# Patient Record
Sex: Male | Born: 1940 | Race: White | Hispanic: No | Marital: Married | Smoking: Never smoker
Health system: Southern US, Community
[De-identification: ages and names within clinical notes are randomized; demographics above are authoritative.]

## PROBLEM LIST (undated history)

## (undated) DIAGNOSIS — E119 Type 2 diabetes mellitus without complications: Secondary | ICD-10-CM

## (undated) DIAGNOSIS — K449 Diaphragmatic hernia without obstruction or gangrene: Secondary | ICD-10-CM

## (undated) DIAGNOSIS — C4491 Basal cell carcinoma of skin, unspecified: Secondary | ICD-10-CM

## (undated) DIAGNOSIS — I639 Cerebral infarction, unspecified: Secondary | ICD-10-CM

## (undated) DIAGNOSIS — N486 Induration penis plastica: Secondary | ICD-10-CM

## (undated) DIAGNOSIS — Z8719 Personal history of other diseases of the digestive system: Secondary | ICD-10-CM

## (undated) DIAGNOSIS — G20A1 Parkinson's disease without dyskinesia, without mention of fluctuations: Secondary | ICD-10-CM

## (undated) DIAGNOSIS — G20 Parkinson's disease: Secondary | ICD-10-CM

## (undated) DIAGNOSIS — E7800 Pure hypercholesterolemia, unspecified: Secondary | ICD-10-CM

## (undated) DIAGNOSIS — I10 Essential (primary) hypertension: Secondary | ICD-10-CM

## (undated) DIAGNOSIS — C439 Malignant melanoma of skin, unspecified: Secondary | ICD-10-CM

## (undated) DIAGNOSIS — Z8711 Personal history of peptic ulcer disease: Secondary | ICD-10-CM

## (undated) DIAGNOSIS — K219 Gastro-esophageal reflux disease without esophagitis: Secondary | ICD-10-CM

## (undated) HISTORY — DX: Personal history of other diseases of the digestive system: Z87.19

## (undated) HISTORY — DX: Basal cell carcinoma of skin, unspecified: C44.91

## (undated) HISTORY — DX: Malignant melanoma of skin, unspecified: C43.9

## (undated) HISTORY — DX: Induration penis plastica: N48.6

## (undated) HISTORY — DX: Diaphragmatic hernia without obstruction or gangrene: K44.9

## (undated) HISTORY — DX: Cerebral infarction, unspecified: I63.9

## (undated) HISTORY — DX: Pure hypercholesterolemia, unspecified: E78.00

## (undated) HISTORY — DX: Gastro-esophageal reflux disease without esophagitis: K21.9

## (undated) HISTORY — DX: Personal history of peptic ulcer disease: Z87.11

## (undated) HISTORY — DX: Parkinson's disease without dyskinesia, without mention of fluctuations: G20.A1

## (undated) HISTORY — DX: Parkinson's disease: G20

## (undated) HISTORY — DX: Type 2 diabetes mellitus without complications: E11.9

## (undated) HISTORY — DX: Essential (primary) hypertension: I10

---

## 1993-02-22 HISTORY — PX: CHOLECYSTECTOMY: SHX55

## 2000-12-25 HISTORY — PX: OTHER SURGICAL HISTORY: SHX169

## 2001-05-28 ENCOUNTER — Ambulatory Visit (HOSPITAL_COMMUNITY): Admission: RE | Admit: 2001-05-28 | Discharge: 2001-05-28 | Payer: Self-pay

## 2005-11-06 ENCOUNTER — Ambulatory Visit: Payer: Self-pay | Admitting: Pulmonary Disease

## 2005-11-14 ENCOUNTER — Ambulatory Visit: Payer: Self-pay | Admitting: Pulmonary Disease

## 2006-12-03 ENCOUNTER — Ambulatory Visit: Payer: Self-pay | Admitting: Pulmonary Disease

## 2007-09-09 ENCOUNTER — Ambulatory Visit: Payer: Self-pay | Admitting: Pulmonary Disease

## 2007-09-09 LAB — CONVERTED CEMR LAB
ALT: 37 units/L (ref 0–53)
AST: 32 units/L (ref 0–37)
Albumin: 4 g/dL (ref 3.5–5.2)
Alkaline Phosphatase: 51 units/L (ref 39–117)
BUN: 7 mg/dL (ref 6–23)
Basophils Absolute: 0.1 10*3/uL (ref 0.0–0.1)
Basophils Relative: 1.1 % — ABNORMAL HIGH (ref 0.0–1.0)
Bilirubin, Direct: 0.2 mg/dL (ref 0.0–0.3)
CO2: 31 meq/L (ref 19–32)
Calcium: 8.8 mg/dL (ref 8.4–10.5)
Chloride: 108 meq/L (ref 96–112)
Cholesterol: 133 mg/dL (ref 0–200)
Creatinine, Ser: 1.1 mg/dL (ref 0.4–1.5)
Eosinophils Absolute: 0.1 10*3/uL (ref 0.0–0.6)
Eosinophils Relative: 1.6 % (ref 0.0–5.0)
GFR calc Af Amer: 86 mL/min
GFR calc non Af Amer: 71 mL/min
Glucose, Bld: 113 mg/dL — ABNORMAL HIGH (ref 70–99)
HCT: 45.9 % (ref 39.0–52.0)
HDL: 33.1 mg/dL — ABNORMAL LOW (ref >39.0)
Hemoglobin: 15.9 g/dL (ref 13.0–17.0)
LDL Cholesterol: 81 mg/dL (ref 0–99)
Lymphocytes Relative: 34.3 % (ref 12.0–46.0)
MCHC: 34.6 g/dL (ref 30.0–36.0)
MCV: 94.1 fL (ref 78.0–100.0)
Monocytes Absolute: 0.7 10*3/uL (ref 0.2–0.7)
Monocytes Relative: 10 % (ref 3.0–11.0)
Neutro Abs: 3.9 10*3/uL (ref 1.4–7.7)
Neutrophils Relative %: 53 % (ref 43.0–77.0)
PSA: 0.64 ng/mL (ref 0.10–4.00)
Platelets: 203 10*3/uL (ref 150–400)
Potassium: 3.9 meq/L (ref 3.5–5.1)
RBC: 4.87 M/uL (ref 4.22–5.81)
RDW: 12.8 % (ref 11.5–14.6)
Sodium: 144 meq/L (ref 135–145)
TSH: 1.51 microintl units/mL (ref 0.35–5.50)
Total Bilirubin: 1.2 mg/dL (ref 0.3–1.2)
Total CHOL/HDL Ratio: 4
Total Protein: 6.9 g/dL (ref 6.0–8.3)
Triglycerides: 95 mg/dL (ref 0–149)
VLDL: 19 mg/dL (ref 0–40)
WBC: 7.3 10*3/uL (ref 4.5–10.5)

## 2007-10-21 ENCOUNTER — Ambulatory Visit: Payer: Self-pay | Admitting: Gastroenterology

## 2007-10-21 ENCOUNTER — Ambulatory Visit: Payer: Self-pay | Admitting: Pulmonary Disease

## 2007-10-21 LAB — CONVERTED CEMR LAB
Bilirubin Urine: NEGATIVE
Urine Glucose: NEGATIVE mg/dL

## 2008-03-05 ENCOUNTER — Telehealth (INDEPENDENT_AMBULATORY_CARE_PROVIDER_SITE_OTHER): Payer: Self-pay | Admitting: *Deleted

## 2008-08-17 ENCOUNTER — Telehealth (INDEPENDENT_AMBULATORY_CARE_PROVIDER_SITE_OTHER): Payer: Self-pay | Admitting: *Deleted

## 2008-08-18 ENCOUNTER — Encounter: Payer: Self-pay | Admitting: Pulmonary Disease

## 2008-11-17 ENCOUNTER — Telehealth (INDEPENDENT_AMBULATORY_CARE_PROVIDER_SITE_OTHER): Payer: Self-pay | Admitting: *Deleted

## 2008-11-23 ENCOUNTER — Encounter: Payer: Self-pay | Admitting: Pulmonary Disease

## 2008-12-16 DIAGNOSIS — E7800 Pure hypercholesterolemia, unspecified: Secondary | ICD-10-CM | POA: Insufficient documentation

## 2008-12-16 DIAGNOSIS — M545 Low back pain, unspecified: Secondary | ICD-10-CM | POA: Insufficient documentation

## 2008-12-16 DIAGNOSIS — K219 Gastro-esophageal reflux disease without esophagitis: Secondary | ICD-10-CM | POA: Insufficient documentation

## 2008-12-16 DIAGNOSIS — J309 Allergic rhinitis, unspecified: Secondary | ICD-10-CM | POA: Insufficient documentation

## 2008-12-17 ENCOUNTER — Ambulatory Visit: Payer: Self-pay | Admitting: Pulmonary Disease

## 2008-12-17 DIAGNOSIS — N4889 Other specified disorders of penis: Secondary | ICD-10-CM | POA: Insufficient documentation

## 2008-12-17 DIAGNOSIS — K449 Diaphragmatic hernia without obstruction or gangrene: Secondary | ICD-10-CM | POA: Insufficient documentation

## 2008-12-20 LAB — CONVERTED CEMR LAB
AST: 45 units/L — ABNORMAL HIGH (ref 0–37)
Alkaline Phosphatase: 52 units/L (ref 39–117)
BUN: 13 mg/dL (ref 6–23)
Bacteria, UA: NEGATIVE
Basophils Absolute: 0 10*3/uL (ref 0.0–0.1)
Basophils Relative: 0.4 % (ref 0.0–3.0)
Bilirubin, Direct: 0.2 mg/dL (ref 0.0–0.3)
Chloride: 103 meq/L (ref 96–112)
Eosinophils Relative: 1.5 % (ref 0.0–5.0)
GFR calc non Af Amer: 71 mL/min
Ketones, ur: NEGATIVE mg/dL
LDL Cholesterol: 102 mg/dL — ABNORMAL HIGH (ref 0–99)
Leukocytes, UA: NEGATIVE
Lymphocytes Relative: 28.5 % (ref 12.0–46.0)
Neutrophils Relative %: 60.9 % (ref 43.0–77.0)
Potassium: 4.2 meq/L (ref 3.5–5.1)
RBC / HPF: NONE SEEN
RBC: 5.28 M/uL (ref 4.22–5.81)
Sodium: 141 meq/L (ref 135–145)
Specific Gravity, Urine: >=1.03 (ref 1.000–1.03)
Total Bilirubin: 1.1 mg/dL (ref 0.3–1.2)
Total CHOL/HDL Ratio: 4
VLDL: 31 mg/dL (ref 0–40)
WBC: 8.8 10*3/uL (ref 4.5–10.5)
pH: 5.5 (ref 5.0–8.0)

## 2008-12-21 ENCOUNTER — Telehealth: Payer: Self-pay | Admitting: Pulmonary Disease

## 2008-12-29 ENCOUNTER — Telehealth: Payer: Self-pay | Admitting: Pulmonary Disease

## 2008-12-30 ENCOUNTER — Telehealth (INDEPENDENT_AMBULATORY_CARE_PROVIDER_SITE_OTHER): Payer: Self-pay | Admitting: *Deleted

## 2009-01-04 ENCOUNTER — Telehealth (INDEPENDENT_AMBULATORY_CARE_PROVIDER_SITE_OTHER): Payer: Self-pay | Admitting: *Deleted

## 2009-01-05 ENCOUNTER — Encounter: Payer: Self-pay | Admitting: Pulmonary Disease

## 2009-03-24 ENCOUNTER — Telehealth (INDEPENDENT_AMBULATORY_CARE_PROVIDER_SITE_OTHER): Payer: Self-pay | Admitting: *Deleted

## 2009-03-25 ENCOUNTER — Telehealth (INDEPENDENT_AMBULATORY_CARE_PROVIDER_SITE_OTHER): Payer: Self-pay | Admitting: *Deleted

## 2009-03-25 ENCOUNTER — Ambulatory Visit: Payer: Self-pay | Admitting: Pulmonary Disease

## 2009-03-25 DIAGNOSIS — I10 Essential (primary) hypertension: Secondary | ICD-10-CM | POA: Insufficient documentation

## 2009-04-26 ENCOUNTER — Ambulatory Visit: Payer: Self-pay | Admitting: Pulmonary Disease

## 2009-04-26 DIAGNOSIS — R7309 Other abnormal glucose: Secondary | ICD-10-CM | POA: Insufficient documentation

## 2009-11-22 ENCOUNTER — Telehealth: Payer: Self-pay | Admitting: Pulmonary Disease

## 2009-11-24 HISTORY — PX: CATARACT EXTRACTION: SUR2

## 2009-12-30 ENCOUNTER — Ambulatory Visit: Payer: Self-pay | Admitting: Pulmonary Disease

## 2010-01-02 LAB — CONVERTED CEMR LAB
ALT: 30 units/L (ref 0–53)
Alkaline Phosphatase: 53 units/L (ref 39–117)
BUN: 10 mg/dL (ref 6–23)
Basophils Relative: 0.8 % (ref 0.0–3.0)
Bilirubin, Direct: 0.1 mg/dL (ref 0.0–0.3)
Calcium: 9.3 mg/dL (ref 8.4–10.5)
Creatinine, Ser: 1.1 mg/dL (ref 0.4–1.5)
Eosinophils Absolute: 0.1 10*3/uL (ref 0.0–0.7)
Eosinophils Relative: 1.1 % (ref 0.0–5.0)
HDL: 42.6 mg/dL (ref >39.00)
Hgb A1c MFr Bld: 6.1 % (ref 4.6–6.5)
LDL Cholesterol: 82 mg/dL (ref 0–99)
Lymphocytes Relative: 29.4 % (ref 12.0–46.0)
Neutrophils Relative %: 58.5 % (ref 43.0–77.0)
PSA: 1.25 ng/mL (ref 0.10–4.00)
Platelets: 179 10*3/uL (ref 150.0–400.0)
RBC: 4.89 M/uL (ref 4.22–5.81)
Total Bilirubin: 0.9 mg/dL (ref 0.3–1.2)
Total CHOL/HDL Ratio: 4
Total Protein: 7.2 g/dL (ref 6.0–8.3)
Triglycerides: 137 mg/dL (ref 0.0–149.0)
VLDL: 27.4 mg/dL (ref 0.0–40.0)
WBC: 7.2 10*3/uL (ref 4.5–10.5)

## 2010-01-21 ENCOUNTER — Ambulatory Visit: Payer: Self-pay | Admitting: Pulmonary Disease

## 2010-01-26 LAB — CONVERTED CEMR LAB
OCCULT 2: NEGATIVE
OCCULT 3: NEGATIVE
OCCULT 4: NEGATIVE

## 2010-05-31 ENCOUNTER — Telehealth: Payer: Self-pay | Admitting: Pulmonary Disease

## 2010-06-08 ENCOUNTER — Telehealth: Payer: Self-pay | Admitting: Pulmonary Disease

## 2010-12-30 ENCOUNTER — Other Ambulatory Visit: Payer: Self-pay | Admitting: Pulmonary Disease

## 2010-12-30 ENCOUNTER — Ambulatory Visit
Admission: RE | Admit: 2010-12-30 | Discharge: 2010-12-30 | Payer: Self-pay | Source: Home / Self Care | Attending: Internal Medicine | Admitting: Internal Medicine

## 2010-12-30 ENCOUNTER — Ambulatory Visit
Admission: RE | Admit: 2010-12-30 | Discharge: 2010-12-30 | Payer: Self-pay | Source: Home / Self Care | Attending: Pulmonary Disease | Admitting: Pulmonary Disease

## 2010-12-30 LAB — HEPATIC FUNCTION PANEL
ALT: 37 U/L (ref 0–53)
AST: 32 U/L (ref 0–37)
Albumin: 4.1 g/dL (ref 3.5–5.2)
Alkaline Phosphatase: 53 U/L (ref 39–117)
Bilirubin, Direct: 0.1 mg/dL (ref 0.0–0.3)
Total Bilirubin: 0.8 mg/dL (ref 0.3–1.2)
Total Protein: 7.1 g/dL (ref 6.0–8.3)

## 2010-12-30 LAB — LIPID PANEL
Cholesterol: 147 mg/dL (ref 0–200)
HDL: 36.5 mg/dL — ABNORMAL LOW (ref >39.00)
LDL Cholesterol: 85 mg/dL (ref 0–99)
Total CHOL/HDL Ratio: 4
Triglycerides: 130 mg/dL (ref 0.0–149.0)
VLDL: 26 mg/dL (ref 0.0–40.0)

## 2010-12-30 LAB — BASIC METABOLIC PANEL
BUN: 16 mg/dL (ref 6–23)
CO2: 27 mEq/L (ref 19–32)
Calcium: 9.2 mg/dL (ref 8.4–10.5)
Chloride: 106 mEq/L (ref 96–112)
Creatinine, Ser: 1.1 mg/dL (ref 0.4–1.5)
GFR: 71.69 mL/min (ref >60.00)
Glucose, Bld: 111 mg/dL — ABNORMAL HIGH (ref 70–99)
Potassium: 4.7 mEq/L (ref 3.5–5.1)
Sodium: 142 mEq/L (ref 135–145)

## 2010-12-30 LAB — TSH: TSH: 1.69 u[IU]/mL (ref 0.35–5.50)

## 2010-12-30 LAB — CBC WITH DIFFERENTIAL/PLATELET
Basophils Absolute: 0 10*3/uL (ref 0.0–0.1)
Basophils Relative: 0.4 % (ref 0.0–3.0)
Eosinophils Absolute: 0.1 10*3/uL (ref 0.0–0.7)
Eosinophils Relative: 1.8 % (ref 0.0–5.0)
HCT: 46.3 % (ref 39.0–52.0)
Hemoglobin: 16.1 g/dL (ref 13.0–17.0)
Lymphocytes Relative: 31.6 % (ref 12.0–46.0)
Lymphs Abs: 2.5 10*3/uL (ref 0.7–4.0)
MCHC: 34.8 g/dL (ref 30.0–36.0)
MCV: 94.4 fl (ref 78.0–100.0)
Monocytes Absolute: 0.8 10*3/uL (ref 0.1–1.0)
Monocytes Relative: 10.1 % (ref 3.0–12.0)
Neutro Abs: 4.4 10*3/uL (ref 1.4–7.7)
Neutrophils Relative %: 56.1 % (ref 43.0–77.0)
Platelets: 201 10*3/uL (ref 150.0–400.0)
RBC: 4.91 Mil/uL (ref 4.22–5.81)
RDW: 13.3 % (ref 11.5–14.6)
WBC: 7.9 10*3/uL (ref 4.5–10.5)

## 2010-12-30 LAB — URINALYSIS, ROUTINE W REFLEX MICROSCOPIC
Hemoglobin, Urine: NEGATIVE
Ketones, ur: NEGATIVE
Leukocytes, UA: NEGATIVE
Nitrite: NEGATIVE
Specific Gravity, Urine: >=1.03 (ref 1.000–1.030)
Total Protein, Urine: NEGATIVE
Urine Glucose: NEGATIVE
Urobilinogen, UA: 1 (ref 0.0–1.0)
pH: 5 (ref 5.0–8.0)

## 2010-12-30 LAB — PSA: PSA: 0.72 ng/mL (ref 0.10–4.00)

## 2010-12-30 LAB — HEMOGLOBIN A1C: Hgb A1c MFr Bld: 6.3 % (ref 4.6–6.5)

## 2011-01-24 NOTE — Assessment & Plan Note (Signed)
Summary: physical///kp   CC:  7 month ROV & review of mult medical problems....  History of Present Illness: 70 y/o WM here for a follow up visit... he has multiple medical problems as noted below...     ~  December 17, 2008:  he has had a good year overall but notes some dyspnea and urinary symptoms... he feels as though his breath (inspiration) "stutters" intermittently & occas can't get a deep breath in... denies wheezing, cough, sputum prod, etc... there is no CP but he notes some DOE w/ activity- he is not exercising... wife indicates to him that he snores but not overly restless or w/ apneas reported... his other complaint is urinary urgency-"when I get the urge I have to go right then"...no burning, stream OK, etc...       ** We tired Doxazosin 8mg  and he reports that urinary symptoms resolved so he stopped the med after  ~31mo w/o recurrent problems reported... We also trie Alprazolam for the stuttering breaths and this has helped as well.   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 saw TP and started on LISINOPRIL 10mg /d... not checking BP at home... BP sl better on the Lisinopril and he wants to continue this med...      ** REC> increase the Lisinopril to 20mg /d, & get weight down!   ~  December 30, 2009:  he's had a good 6 months- no new complaints or concerns... had right cat surg DrEpes 12/10... BP controlled w/ Lisinoprol20 & wt down 10#... Chol OK on Simva80, and BS OK on diet alone... we discussed need for f/u colon but he declines at this time (will do stool cards- and call GI at his convenience)), and refuses Flu shots.    Current Problems:   ALLERGIC RHINITIS (ICD-477.9) - uses OTC meds Prn...  Hx of SINUSITIS (ICD-473.9)  ? of DYSPNEA (ICD-786.05) - see above... symptoms resolved w/ Alprazolam Rx...  ~  Baseline CXR w/ scarring LLL, DJD TSpine, otherw WNL.[name]. last film 12/09   ~  PFT's 12/09= WNL.Marland KitchenMarland Kitchen FVC=6.14 (126%), FEV1= 4.94 (150%), %1sec= 80,  mid-flows= 174%...  HYPERTENSION (ICD-401.9) - on ASA 81mg /d, & LISINOPRIL 20mg /d... BP = 136/80 & similar at home... he is asymptomatic- denies HA, fatigue, visual changes, CP, palipit, dizziness, syncope, dyspnea, edema, etc...   HYPERCHOLESTEROLEMIA (ICD-272.0) - on SIMVASTATIN 80mg /d...  ~  FLP 9/08 showed TChol 133, TG 95, HDL 33, LDL 81  ~  FLP 12/09 showed TChol 177, TG 153, HDL 44, LDL 102... encouraged to take Simva daily.  ~  FLP 1/11 showed TChol 152, TG 137, HDL 43, LDL 82  DIABETES MELLITUS, BORDERLINE (ICD-790.29) - he has been counselled on low carb/ weight reducing diet + exercise program...  ~  labs throught the decade of the 2000's showed FBS from 109-121  ~  labs 12/09 showed FBS= 127... rec to avoid sweets and lose weight.  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 he required dilatation in 1988 & 1992...   ~  last EGD 1995 showed large HH, GERD w/ ulcerative inflamm, Bx for HPylori= neg.  ~  last colonoscopy 11/97 was negative- he is currently overdue for screening colon f/u...  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 he had full eval and Rx from DrEvans in 1999  BACK PAIN, LUMBAR (ICD-724.2)  Allergies: 1)  Penicillin V Potassium (Penicillin V Potassium)  Comments:  Nurse/Medical Assistant: The patient's medications and allergies were reviewed with the patient and were updated in the Medication and Allergy Lists.  Past History:  Past Medical History: ALLERGIC RHINITIS (ICD-477.9) Hx of SINUSITIS (ICD-473.9) ? 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 Father is alive age 61 and in good general health Mother is alive age 42 but in a NH due to arthritis & obesity 1 Sibling- sister age 7 w/ arthritis...  Social History: Reviewed history from 12/17/2008 and no changes required. Married- 1 child  never smoked social etoh not currently exercising retired [employer]  Review of Systems  The patient denies fever, chills, sweats, anorexia, fatigue, weakness, malaise, weight loss, sleep disorder, blurring, diplopia, eye irritation, eye discharge, vision loss, eye pain, photophobia, earache, ear discharge, tinnitus, decreased hearing, nasal congestion, nosebleeds, sore throat, hoarseness, chest pain, palpitations, syncope, dyspnea on exertion, orthopnea, PND, peripheral edema, cough, dyspnea at rest, excessive sputum, hemoptysis, wheezing, pleurisy, nausea, vomiting, diarrhea, constipation, change in bowel habits, abdominal pain, melena, hematochezia, jaundice, gas/bloating, indigestion/heartburn, dysphagia, odynophagia, dysuria, hematuria, urinary frequency, urinary hesitancy, nocturia, incontinence, back pain, joint pain, joint swelling, muscle cramps, muscle weakness, stiffness, arthritis, sciatica, restless legs, leg pain at night, leg pain with exertion, rash, itching, dryness, suspicious lesions, paralysis, paresthesias, seizures, tremors, vertigo, transient blindness, frequent falls, frequent headaches, difficulty walking, depression, anxiety, memory loss, confusion, cold intolerance, heat intolerance, polydipsia, polyphagia, polyuria, unusual weight change, abnormal bruising, bleeding, enlarged lymph nodes, urticaria, allergic rash, hay fever, and recurrent infections.    Vital Signs:  Patient profile:   70 year old male Height:      73 inches Weight:      230 pounds BMI:     30.45 O2 Sat:      92 % on Room air Temp:     97.9 degrees  F oral Pulse rate:   80 / minute BP sitting:   136 / 80  (right arm) Cuff size:   regular  Vitals Entered By: Randell Loop CMA (December 30, 2009 9:23 AM)  O2 Sat at Rest %:  92 O2 Flow:  Room air CC: 7 month ROV & review of mult medical problems... Pain Assessment Patient in pain? no      Comments no changes in meds   Physical Exam  Additional Exam:  WD, WN, 70 y/o WM in NAD... GENERAL:  Alert & oriented; pleasant & cooperative... HEENT:  [place]/AT, EOM-wnl, PERRLA, EACs-clear, TMs-wnl, NOSE-clear, THROAT-clear & wnl. NECK:  Supple w/ fairROM; no JVD; normal carotid impulses w/o bruits; no thyromegaly or nodules palpated; no lymphadenopathy. CHEST:  Clear to P & A; without wheezes/ rales/ or rhonchi heard... HEART:  Regular Rhythm; without murmurs/ rubs/ or gallops detected... ABDOMEN:  Soft & nontender; normal bowel sounds; no organomegaly or masses palpated... EXT: without deformities or arthritic changes; no varicose veins/ venous insuffic/ or edema. NEURO:  CN's intact; motor testing normal; sensory testing normal; gait normal & balance OK. DERM:  No lesions noted; no rash etc...     EKG  Procedure date:  12/30/2009  Findings:      Normal sinus rhythm with rate  of:  66/ min... Tracing is WNL...  SN   MISC. Report  Procedure date:  12/30/2009  Findings:        Cholesterol               152 mg/dL                   1-610   Triglycerides             137.0 mg/dL                 9.6-045.4   HDL                       09.81 mg/dL                 >19.14   VLDL Cholesterol          27.4 mg/dL                  7.8-29.5   LDL Cholesterol           82 mg/dL                    6-21     Sodium                    142 mEq/L                   135-145   Potassium                 4.3 mEq/L                   3.5-5.1   Chloride                  106 mEq/L                   96-112   Carbon Dioxide            30 mEq/L                    19-32   Glucose              [H]  108 mg/dL                    30-86   BUN                       10 mg/dL                    5-78   Creatinine                1.1 mg/dL                   4.6-9.6   Calcium                   9.3 mg/dL                   2.9-52.8   GFR                       70.39 mL/min                >60     White Cell Count  7.2 K/uL                    4.5-10.5   Red Cell Count            4.89 Mil/uL                 4.22-5.81   Hemoglobin                15.6 g/dL                   16.1-09.6   Hematocrit                47.2 %                      39.0-52.0   MCV                       96.6 fl                     78.0-100.0   Platelet Count            179.0 K/uL       Neutrophil %              58.5 %                      43.0-77.0   Lymphocyte %              29.4 %                      12.0-46.0   Monocyte %                10.2 %                      3.0-12.0   Eosinophils%              1.1 %                       0.0-5.0   Basophils %               0.8 %  Comments:        Total Bilirubin           0.9 mg/dL                   0.4-5.4   Direct Bilirubin          0.1 mg/dL                   0.9-8.1   Alkaline Phosphatase      53 U/L                      39-117   AST                       29 U/L                      0-37   ALT                       30 U/L                      0-53  Total Protein             7.2 g/dL                    4.4-0.3   Albumin                   4.0 g/dL                    4.7-4.2     FastTSH                   1.76 uIU/mL                 0.35-5.50     Hemoglobin A1C            6.1 %                       4.6-6.5     PSA-Hyb                   1.25 ng/mL                  0.10-4.00    Impression & Recommendations:  Problem # 1:  HYPERTENSION (ICD-401.9) Controlled-  continue med. His updated medication list for this problem includes:    Lisinopril 20 Mg Tabs (Lisinopril) .[name]... Take 1 tab by mouth once daily.  Orders: 12 Lead EKG (12 Lead EKG) Venipuncture (59563) TLB-Lipid  Panel (80061-LIPID) TLB-BMP (Basic Metabolic Panel-BMET) (80048-METABOL) TLB-CBC Platelet - w/Differential (85025-CBCD) TLB-Hepatic/Liver Function Pnl (80076-HEPATIC) TLB-TSH (Thyroid Stimulating Hormone) (84443-TSH) TLB-A1C / Hgb A1C (Glycohemoglobin) (83036-A1C) TLB-PSA (Prostate Specific Antigen) (84153-PSA)  Problem # 2:  HYPERCHOLESTEROLEMIA (ICD-272.0) Doing satis on Simva80 + diet/ exerc... His updated medication list for this problem includes:    Simvastatin 80 Mg Tabs (Simvastatin) .[name]... Take one tablet at bedtime  Problem # 3:  DIABETES MELLITUS, BORDERLINE (ICD-790.29) Cotnrolled on diet alone...  Problem # 4:  GERD (ICD-530.81) GI is stable... His updated medication list for this problem includes:    Omeprazole 20 Mg Cpdr (Omeprazole) ..... By mouth daily. take one half hour before eating.  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  Complete Medication List: 1)  Aspirin Adult Low Strength 81 Mg Tbec (Aspirin) .... Take 1 tablet by mouth once a day 2)  Lisinopril 20 Mg Tabs (Lisinopril) .... Take 1 tab by mouth once daily. 3)  Simvastatin 80 Mg Tabs (Simvastatin) .... Take one tablet at bedtime 4)  Omeprazole 20 Mg Cpdr (Omeprazole) .... By mouth daily. take one half hour before eating. 5)  Viagra 100 Mg Tabs (Sildenafil citrate) .... Take as directed...  Other Orders: Prescription Created Electronically [phone])  Patient Instructions: 1)  Today we updated your med list- see below.... 2)  We refilled your meds for 2011... 3)  Continue to moonitor your BP at home... 4)  Today we did your follow up EKG, and FASTING blood work... please call the "phone tree" in  a few days for your lab results.Marland KitchenMarland Kitchen  5)  We also gave you the "stool cards" to collect over a weekend & mail  back to [place] to check for hidden blood... 6)  You are overdue for your routine screening colonoscopy- please call our GI dept at [phone] to sched at your convenience... 7)  Call for any problems... 8)  Please schedule a follow-up appointment in 1 year, sooner as needed... Prescriptions: VIAGRA 100 MG TABS (SILDENAFIL CITRATE) take as directed...  #10 x prn   Entered and Authorized by:   Michele Mcalpine MD   Signed by:   Michele Mcalpine MD on 12/30/2009   Method used:   Print then Give to Patient   RxID:   [id number] OMEPRAZOLE 20 MG  CPDR (OMEPRAZOLE) By mouth daily. Take one half hour before eating.  #90 x prn   Entered and Authorized by:   Michele Mcalpine MD   Signed by:   Michele Mcalpine MD on 12/30/2009   Method used:   Print then Give to Patient   RxID:   [id number] SIMVASTATIN 80 MG  TABS (SIMVASTATIN) take one tablet at bedtime  #90 x prn   Entered and Authorized by:   Michele Mcalpine MD   Signed by:   Michele Mcalpine MD on 12/30/2009   Method used:   Print then Give to Patient   RxID:   [id number] LISINOPRIL 20 MG TABS (LISINOPRIL) take 1 tab by mouth once daily.  #90 x prn   Entered and Authorized by:   Michele Mcalpine MD   Signed by:   Michele Mcalpine MD on 12/30/2009   Method used:   Print then Give to Patient   RxID:   [id number]    CardioPerfect ECG  ID: [id number] Patient: Howard Fisher, Howard Fisher DOB: [birth year] Age: 70 Years Old Sex: Male Race: White Physician: nadel,s Technician: Randell Loop CMA Height: 73 Weight: 230 Status: Unconfirmed Past Medical History:  ALLERGIC RHINITIS (ICD-477.9) Hx of SINUSITIS (ICD-473.9) ? 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 Tracing is WNL.Marland KitchenMarland Kitchen  SN

## 2011-01-24 NOTE — Progress Notes (Signed)
Summary: req to be seen today  Phone Note Call from Patient   Caller: Patient Call For: Jaryan Chicoine Summary of Call: pt c/o congestion- non productive cough x 5 days. no fever. requests to be seen today. [phone] Initial call taken by: Tivis Ringer, CNA,  May 31, 2010 8:51 AM  Follow-up for Phone Call        per SN---we will call in zpak #1   and try mucinex 2 by mouth two times a day with plenty of fluids and delsym 2 tsp by mouth two times a day for the cough.     called and spoke with pt and he is aware of SN recs.  he voiced his understanding and will call for further concerns. Randell Loop CMA  May 31, 2010 10:16 AM     New/Updated Medications: ZITHROMAX Z-PAK 250 MG TABS (AZITHROMYCIN) take as directed Prescriptions: ZITHROMAX Z-PAK 250 MG TABS (AZITHROMYCIN) take as directed  #1 pack x 0   Entered by:   Randell Loop CMA   Authorized by:   Michele Mcalpine MD   Signed by:   Randell Loop CMA on 05/31/2010   Method used:   Electronically to        [facility]  [address] [phone]* (retail)       [address]       [place], [place]  [address]       Ph: [phone] or [phone]       Fax: [phone]   RxID:   [id number]

## 2011-01-24 NOTE — Progress Notes (Signed)
Summary: talk to nurse  Phone Note Call from Patient Call back at Work Phone [phone]   Caller: Patient Call For: Howard Fisher Summary of Call: Pt states he has been off his zpak for three days, he is better but he is still congested, wants to know what else he can take pls advise.//[facility] Initial call taken by: Darletta Moll,  June 08, 2010 10:51 AM  Follow-up for Phone Call        Spoke with pt.  Pt states he finished zpak 3 days ago.  States he is better but still having sinus congestion, prod cough with light yellow mucus, and "head is heavy."  Requesting SN's recs.  Allergies-PCN  [facility].  Will forward message to SN-pls advise.  Thanks! Gweneth Dimitri RN  June 08, 2010 11:08 AM   Additional Follow-up for Phone Call Additional follow up Details #1::        per sn have pt try mucinex dm 2 tabs two times a day with lots of fluids for 5-7 days and saline nasal spray 2 sprays each nostril as often as needed for nasal dryness and congestion--let him know the antibiotic you take for 5 days but it continues to work for 5 additional days-call  back if no better or worse by friday Additional Follow-up by: Randell Loop CMA,  June 08, 2010 11:44 AM    Additional Follow-up for Phone Call Additional follow up Details #2::    pt advised of recs. and will call on friday of no better. Carron Curie CMA  June 08, 2010 11:49 AM

## 2011-01-26 NOTE — Assessment & Plan Note (Signed)
Summary: CPX/ MBW   CC:  Yearly ROV & review of mult medical problems....  History of Present Illness: 70 y/o WM here for a follow up visit... he has multiple medical problems as noted below...     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 saw TP and started on LISINOPRIL 10mg /d... not checking BP at home... BP sl better on the Lisinopril and he wants to continue this med...  ** REC> increase the Lisinopril to 20mg /d, & get weight down!   ~  ZOX09:  he's had a good 6 months- no new complaints or concerns... had right cat surg DrEpes 12/10... BP controlled w/ Lisinoprol20 & wt down 10#... Chol OK on Simva80, and BS OK on diet alone... we discussed need for f/u colon but he declines at this time (will do stool cards- and call GI at his convenience), and refuses Flu shots.   ~  December 30, 2010:  Yearly ROV & review- feeling well, no new complaints or concerns, just same mild urinary symptoms w/ freq/ hesitancy but not bad enough for GU eval he says...     Current Problems:   ALLERGIC RHINITIS (ICD-477.9) - uses OTC meds Prn...  Hx of SINUSITIS (ICD-473.9)  ? of DYSPNEA (ICD-786.05) - see above... symptoms resolved w/ Alprazolam Rx...  ~  Baseline CXR w/ scarring LLL, DJD TSpine, otherw WNL.[name]. last film 12/09   ~  PFT's 12/09= WNL.Marland KitchenMarland Kitchen FVC=6.14 (126%), FEV1= 4.94 (150%), %1sec= 80, mid-flows= 174%...  ~  CXR 1/12 showed clear lungs, NAD...  HYPERTENSION (ICD-401.9) - on ASA 81mg /d, & LISINOPRIL 20mg /d... BP = 132/82 & similar at home... he is asymptomatic- denies HA, fatigue, visual changes, CP, palipit, dizziness, syncope, dyspnea, edema, etc...   HYPERCHOLESTEROLEMIA (ICD-272.0) - on SIMVASTATIN 80mg /d...  ~  FLP 9/08 showed TChol 133, TG 95, HDL 33, LDL 81  ~  FLP 12/09 showed TChol 177, TG 153, HDL 44, LDL 102... encouraged to take Simva daily.  ~  FLP 1/11 showed TChol 152, TG 137, HDL 43, LDL 82  ~  FLP 1/12 showed TChol 147, TG 130, HDL 37, LDL 85  DIABETES  MELLITUS, BORDERLINE (ICD-790.29) - he has been counselled on low carb/ weight reducing diet + exercise program...  ~  labs throught the decade of the 2000's showed FBS from 109-121  ~  labs 12/09 showed FBS= 127... rec to avoid sweets and lose weight.  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 he required dilatation in 1988 & 1992...   ~  last EGD 1995 showed large HH, GERD w/ ulcerative inflamm, Bx for HPylori= neg.  ~  last colonoscopy 11/97 was negative- he is currently overdue for screening colon f/u...  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  ~  GU:  stable w/ PSA= 0.72  ~  Immunizations:     Preventive Screening-Counseling & Management  Alcohol-Tobacco     Smoking Status: never  Allergies: 1)  Penicillin V Potassium (Penicillin V Potassium)  Comments:  Nurse/Medical Assistant: The patient's medications and allergies were reviewed  with the patient and were updated in the Medication and Allergy Lists.  Past History:  Past Medical History: ALLERGIC RHINITIS (ICD-477.9) Hx of SINUSITIS (ICD-473.9) ? 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 Father is alive age 33 and in good general health Mother is  alive age 27 but in a NH due to arthritis & obesity 1 Sibling- sister age 22 w/ arthritis...  Social History: Reviewed history from 12/17/2008 and no changes required. Married- 1 child  never smoked social etoh not currently exercising retired [employer]  Review of Systems      See HPI  The patient denies anorexia, fever, weight loss, weight gain, vision loss, decreased hearing, hoarseness, chest pain, syncope, dyspnea on exertion, peripheral edema, prolonged cough, headaches, hemoptysis, abdominal pain, melena, hematochezia, severe indigestion/heartburn, hematuria, incontinence, muscle weakness, suspicious skin lesions, transient blindness, difficulty walking, depression, unusual weight change, abnormal bleeding, enlarged lymph nodes, and angioedema.    Vital Signs:  Patient profile:   70 year old male Height:      73 inches Weight:      237 pounds BMI:     31.38 O2 Sat:      95 % on Room air Temp:     99.1 degrees F oral Pulse rate:   71 / minute BP sitting:   132 / 82  (left arm) Cuff size:   regular  Vitals Entered By: Randell Loop CMA (December 30, 2010 8:53 AM)  O2 Sat at Rest %:  95 O2 Flow:  Room air CC: Yearly ROV & review of mult medical problems... Is Patient Diabetic? No Pain Assessment Patient in pain? no      Comments meds updated today with pt   Physical Exam  Additional Exam:  WD, WN, 70 y/o WM in NAD... GENERAL:  Alert & oriented; pleasant & cooperative... HEENT:  [place]/AT, EOM-wnl, PERRLA, EACs-clear, TMs-wnl, NOSE-clear, THROAT-clear & wnl. NECK:  Supple w/ fairROM; no JVD; normal carotid impulses w/o bruits; no thyromegaly or nodules palpated; no lymphadenopathy. CHEST:  Clear to P & A; without wheezes/ rales/ or rhonchi heard... HEART:  Regular Rhythm; without murmurs/ rubs/ or gallops detected... ABDOMEN:  Soft & nontender; normal bowel sounds; no organomegaly or masses palpated... EXT: without deformities or arthritic changes; no varicose  veins/ venous insuffic/ or edema. NEURO:  CN's intact; motor testing normal; sensory testing normal; gait normal & balance OK. DERM:  No lesions noted; no rash etc...    CXR  Procedure date:  12/30/2010  Findings:      CHEST - 2 VIEW Comparison: [facility] chest x-rays 09/09/2007 and 12/17/2008.   Findings: Lungs are clear.  Heart size normal.  Mediastinum, hila, pleura and degenerative changes dorsal spine unchanged.   IMPRESSION: Stable - no active cardiopulmonary disease.   Read By:  Barnie Del,  M.D.   MISC. Report  Procedure date:  12/30/2010  Findings:      BMP (METABOL)   Sodium                    142 mEq/L                   135-145  Potassium                 4.7 mEq/L                   3.5-5.1   Chloride                  106 mEq/L                   96-112   Carbon Dioxide            27 mEq/L                    19-32   Glucose              [H]  111 mg/dL                   16-10   BUN                       16 mg/dL                    9-60   Creatinine                1.1 mg/dL                   4.5-4.0   Calcium                   9.2 mg/dL                   9.8-11.9   GFR                       71.69 mL/min                >60.00  Hepatic/Liver Function Panel (HEPATIC)   Total Bilirubin           0.8 mg/dL                   1.4-7.8   Direct Bilirubin          0.1 mg/dL                   2.9-5.6   Alkaline Phosphatase      53 U/L                      39-117   AST                       32 U/L                      0-37   ALT                       37 U/L                      0-53   Total Protein             7.1 g/dL                    2.1-3.0   Albumin                   4.1 g/dL  3.5-5.2  CBC Platelet w/Diff (CBCD)   White Cell Count          7.9 K/uL                    4.5-10.5   Red Cell Count            4.91 Mil/uL                 4.22-5.81   Hemoglobin                16.1 g/dL                   16.1-09.6   Hematocrit                 46.3 %                      39.0-52.0   MCV                       94.4 fl                     78.0-100.0   Platelet Count            201.0 K/uL                  150.0-400.0   Neutrophil %              56.1 %                      43.0-77.0   Lymphocyte %              31.6 %                      12.0-46.0   Monocyte %                10.1 %                      3.0-12.0   Eosinophils%              1.8 %                       0.0-5.0   Basophils %               0.4 %                       0.0-3.0  Comments:      Lipid Panel (LIPID)   Cholesterol               147 mg/dL                   0-454   Triglycerides             130.0 mg/dL                 0.9-811.9   HDL                  [L]  14.78 mg/dL                 >29.56   LDL Cholesterol           85 mg/dL  0-99  TSH (TSH)   FastTSH                   1.69 uIU/mL                 0.35-5.50   Hemoglobin A1C (A1C)   Hemoglobin A1C            6.3 %                       4.6-6.5  Prostate Specific Antigen (PSA)   PSA-Hyb                   0.72 ng/mL                  0.10-4.00  UDip w/Micro (URINE)   Color                     YELLOW   Clarity                   CLEAR                       Clear   Specific Gravity          >=1.030                     1.000 - 1.030   Urine Ph                  5.0                         5.0-8.0   Protein                   NEGATIVE                    Negative   Urine Glucose             NEGATIVE                    Negative   Ketones                   NEGATIVE                    Negative   Urine Bilirubin           SMALL                       Negative   Blood                     NEGATIVE                    Negative   Urobilinogen              1.0                         0.0 - 1.0   Leukocyte Esterace        NEGATIVE                    Negative   Nitrite                   NEGATIVE  Negative   Urine WBC                 0-2/hpf                     0-2/hpf   Urine Mucus                Presence of                 None   Urine Epith               Few(5-10/hpf)               Rare(0-4/hpf)   Urine Bacteria            Rare(<10/hpf)               None   Impression & Recommendations:  Problem # 1:  HYPERTENSION (ICD-401.9) Contrrolled>  same meds. His updated medication list for this problem includes:    Lisinopril 20 Mg Tabs (Lisinopril) .[name]... Take 1 tab by mouth once daily.  Orders: T-2 View CXR (71020TC) TLB-BMP (Basic Metabolic Panel-BMET) (80048-METABOL) TLB-Hepatic/Liver Function Pnl (80076-HEPATIC) TLB-CBC Platelet - w/Differential (85025-CBCD) TLB-Lipid Panel (80061-LIPID) TLB-TSH (Thyroid Stimulating Hormone) (84443-TSH) TLB-A1C / Hgb A1C (Glycohemoglobin) (83036-A1C) TLB-PSA (Prostate Specific Antigen) (84153-PSA) TLB-Udip w/ Micro (81001-URINE)  Problem # 2:  HYPERCHOLESTEROLEMIA (ICD-272.0) FLP looks good on Simva80 & he asks to continue same Rx- doesn't want to chanhge, & tol well. His updated medication list for this problem includes:    Simvastatin 80 Mg Tabs (Simvastatin) .[name]... Take one tablet at bedtime  Problem # 3:  DIABETES MELLITUS, BORDERLINE (ICD-790.29) Stable on diet alone- we discussed wt reduction...  Problem # 4:  GERD (ICD-530.81) GI is stable but he needs f/u colonoscopy... His updated medication list for this problem includes:    Omeprazole 20 Mg Cpdr (Omeprazole) ..... By mouth daily. take one half hour before eating.  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  Problem # 6:  BACK PAIN, LUMBAR (ICD-724.2) Aware>  he will incr exercise program etc... His updated medication list for this problem includes:    Aspirin Adult Low Strength 81 Mg Tbec (Aspirin) .[name]... Take 1 tablet by mouth once a day  Problem # 7:  OTHER MEDICAL PROBLEMS AS NOTED>>>  Complete Medication List: 1)  Aspirin Adult Low Strength 81 Mg Tbec (Aspirin) .... Take 1 tablet by mouth once a day 2)   Lisinopril 20 Mg Tabs (Lisinopril) .... Take 1 tab by mouth once daily. 3)  Simvastatin 80 Mg Tabs (Simvastatin) .... Take one tablet at bedtime 4)  Omeprazole 20 Mg Cpdr (Omeprazole) .... By mouth daily. take one half hour before eating. 5)  Viagra 100 Mg Tabs (Sildenafil citrate) .... Take as directed...  Other Orders: Gastroenterology Referral (GI)  Patient Instructions: 1)  Today we updated your med list- see below.... 2)  We refilled your meds for 90d supplies as requested... 3)  Today we did your follow up CXR & FASTING blood work... please call the "phone tree" in a few days for your lab results.Marland KitchenMarland Kitchen 4)  You should sched a follow up colonoscopy at your convenience... 5)  Call for any problems.Marland KitchenMarland Kitchen 6)  Please schedule a follow-up appointment in 1 year. Prescriptions: VIAGRA 100 MG TABS (SILDENAFIL CITRATE) take as directed...  #10 x prn   Entered and Authorized by:   Michele Mcalpine MD  Signed by:   Michele Mcalpine MD on 12/30/2010   Method used:   Print then Give to Patient   RxID:   [id number] OMEPRAZOLE 20 MG  CPDR (OMEPRAZOLE) By mouth daily. Take one half hour before eating.  #90 x 4   Entered and Authorized by:   Michele Mcalpine MD   Signed by:   Michele Mcalpine MD on 12/30/2010   Method used:   Print then Give to Patient   RxID:   [id number] SIMVASTATIN 80 MG  TABS (SIMVASTATIN) take one tablet at bedtime  #90 x 4   Entered and Authorized by:   Michele Mcalpine MD   Signed by:   Michele Mcalpine MD on 12/30/2010   Method used:   Print then Give to Patient   RxID:   [id number] LISINOPRIL 20 MG TABS (LISINOPRIL) take 1 tab by mouth once daily.  #90 x 4   Entered and Authorized by:   Michele Mcalpine MD   Signed by:   Michele Mcalpine MD on 12/30/2010   Method used:   Print then Give to Patient   RxID:   [id number]    Immunization History:  Influenza Immunization History:    Influenza:  declined (12/30/2010)  Pneumovax Immunization History:    Pneumovax:   declined (12/30/2010)

## 2011-03-21 ENCOUNTER — Other Ambulatory Visit: Payer: Self-pay | Admitting: Pulmonary Disease

## 2011-05-12 NOTE — Op Note (Signed)
[facility]  Patient:    Howard Fisher, Howard Fisher                      MRN: [id number] Proc. Date: 05/28/01 Attending:  Zigmund Daniel, M.D.                           Operative Report  PREOPERATIVE DIAGNOSIS:  Indirect right inguinal hernia.  POSTOPERATIVE DIAGNOSIS:  Indirect right inguinal hernia.  OPERATION:  Repair of right inguinal hernia.  SURGEON:  Zigmund Daniel, M.D.  ANESTHESIA:  Local with sedation and monitored anesthesia care.  DESCRIPTION OF PROCEDURE:  After the patient was adequately monitored and sedated and had routine preparation and draping of the right inguinal region, I liberally infused local 0.5% bupivacaine with epinephrine into the skin, subcutaneous tissue, and deep tissues of the inguinal region.  I attempted block of the ilioinguinal nerve laterally, and I reinforced the block of the tissues as I deepened the dissection.  The patient appeared to be comfortable throughout the procedure.  I made an oblique skin incision beginning just above and lateral to the pubic tubercle and dissected down through subcutaneous tissue to the external oblique which I exposed and opened in the direction of its fibers into the external inguinal ring.  I then cleared the spermatic cord from the external oblique and dissected it up from the internal oblique muscle and encircled it with a Penrose drain.  I then cut some fibers of the cremaster to define the internal ring and saw that there was no direct hernia.  I made a longitudinal cut on the spermatic cord, taking care to avoid injury to the vessels, the vas deferens, and the ilioinguinal nerve, and I dissected out a good bit of preperitoneal fat and a sizable indirect hernia sac presenting through the internal ring.  I separated the sac and fat from the other structures and reduced them into the internal ring, plugged the superior and lateral part of the internal ring with a plug of  polypropylene mesh held in place with a single suture of 2-0 silk.  I then fashioned a patch of mesh with a slit cut in it to allow exit of the spermatic cord and shaped it to fit the inguinal floor.  I sewed it in with 2-0 Prolene from the pubic tubercle medially and superiorly with a basting stitch along the internal oblique fascia and laterally  and inferiorly with a running stitch in the inguinal ligament.  I used a single 2-0 Prolene stitch to join the tails of the mesh together just lateral to the spermatic cord and internal ring.  I felt that I had done a good hernia repair at that point.  Hemostasis was excellent.  I put in a little more local anesthetic and closed the external oblique with running 3-0 Vicryl, closed the subcutaneous tissue with running 3-0 Vicryl and closed the skin with intracuticular 4-0 Vicryl and Steri-Strips.  The patient was stable through the procedure. DD:  05/28/01 TD:  05/28/01 Job: [id number]

## 2011-07-24 ENCOUNTER — Telehealth: Payer: Self-pay | Admitting: Pulmonary Disease

## 2011-07-24 MED ORDER — SILDENAFIL CITRATE 100 MG PO TABS: 100.0000 mg | ORAL_TABLET | ORAL | Status: DC | PRN

## 2011-07-24 NOTE — Telephone Encounter (Signed)
Refill sent. Pt aware.Jennifer Castillo, CMA  

## 2012-01-03 ENCOUNTER — Other Ambulatory Visit (INDEPENDENT_AMBULATORY_CARE_PROVIDER_SITE_OTHER): Payer: Medicare Other

## 2012-01-03 ENCOUNTER — Encounter: Payer: Self-pay | Admitting: Pulmonary Disease

## 2012-01-03 ENCOUNTER — Ambulatory Visit (INDEPENDENT_AMBULATORY_CARE_PROVIDER_SITE_OTHER): Payer: Medicare Other | Admitting: Pulmonary Disease

## 2012-01-03 DIAGNOSIS — I10 Essential (primary) hypertension: Secondary | ICD-10-CM

## 2012-01-03 DIAGNOSIS — Z125 Encounter for screening for malignant neoplasm of prostate: Secondary | ICD-10-CM

## 2012-01-03 DIAGNOSIS — Z0000 Encounter for general adult medical examination without abnormal findings: Secondary | ICD-10-CM

## 2012-01-03 DIAGNOSIS — Z23 Encounter for immunization: Secondary | ICD-10-CM

## 2012-01-03 DIAGNOSIS — M545 Low back pain, unspecified: Secondary | ICD-10-CM

## 2012-01-03 DIAGNOSIS — E7800 Pure hypercholesterolemia, unspecified: Secondary | ICD-10-CM

## 2012-01-03 DIAGNOSIS — K449 Diaphragmatic hernia without obstruction or gangrene: Secondary | ICD-10-CM

## 2012-01-03 DIAGNOSIS — K219 Gastro-esophageal reflux disease without esophagitis: Secondary | ICD-10-CM

## 2012-01-03 DIAGNOSIS — R7309 Other abnormal glucose: Secondary | ICD-10-CM

## 2012-01-03 DIAGNOSIS — N4889 Other specified disorders of penis: Secondary | ICD-10-CM

## 2012-01-03 LAB — CBC WITH DIFFERENTIAL/PLATELET
Basophils Relative: 0.3 % (ref 0.0–3.0)
Eosinophils Absolute: 0.1 10*3/uL (ref 0.0–0.7)
Eosinophils Relative: 1.8 % (ref 0.0–5.0)
Lymphocytes Relative: 30.4 % (ref 12.0–46.0)
Neutrophils Relative %: 57.3 % (ref 43.0–77.0)
Platelets: 188 10*3/uL (ref 150.0–400.0)
RBC: 5.22 Mil/uL (ref 4.22–5.81)
WBC: 8.3 10*3/uL (ref 4.5–10.5)

## 2012-01-03 LAB — BASIC METABOLIC PANEL
BUN: 13 mg/dL (ref 6–23)
CO2: 30 mEq/L (ref 19–32)
Chloride: 105 mEq/L (ref 96–112)
Creatinine, Ser: 1 mg/dL (ref 0.4–1.5)
Glucose, Bld: 124 mg/dL — ABNORMAL HIGH (ref 70–99)

## 2012-01-03 LAB — HEPATIC FUNCTION PANEL
ALT: 53 U/L (ref 0–53)
Total Protein: 7.1 g/dL (ref 6.0–8.3)

## 2012-01-03 LAB — LIPID PANEL
Cholesterol: 161 mg/dL (ref 0–200)
LDL Cholesterol: 86 mg/dL (ref 0–99)
Triglycerides: 152 mg/dL — ABNORMAL HIGH (ref 0.0–149.0)

## 2012-01-03 LAB — TSH: TSH: 1.93 u[IU]/mL (ref 0.35–5.50)

## 2012-01-03 MED ORDER — SIMVASTATIN 80 MG PO TABS: 80.0000 mg | ORAL_TABLET | Freq: Every day | ORAL | Status: DC

## 2012-01-03 MED ORDER — LISINOPRIL 20 MG PO TABS: 20.0000 mg | ORAL_TABLET | Freq: Every day | ORAL | Status: DC

## 2012-01-03 MED ORDER — OMEPRAZOLE 20 MG PO CPDR: 20.0000 mg | DELAYED_RELEASE_CAPSULE | Freq: Every day | ORAL | Status: DC

## 2012-01-03 NOTE — Patient Instructions (Signed)
Today we updated your med list in our EPIC system...    Continue your current medications the same...    We refilled your meds per request...  Today we did your follow up CXR, EKG, &  fasting blood work...    Please call the PHONE TREE in a few days for your results...    Dial [id number] & when prompted enter your patient number followed by the # symbol...    Your patient number is:  [id number]#  Let's get on track w/ our diet & exercise program...    The goal is to lose 15-20 lbs...  We gave you the combination Tetanus vaccine called the TDAP today (good for 10 yrs)  We will refer your chart to our gastroenterologists to sched a colonoscopy at your convenience...  Call for any questions...  Let's plan a similar follow up visit in 1 year, sooner if needed for problems.Marland KitchenMarland Kitchen

## 2012-01-09 ENCOUNTER — Ambulatory Visit (INDEPENDENT_AMBULATORY_CARE_PROVIDER_SITE_OTHER)
Admission: RE | Admit: 2012-01-09 | Discharge: 2012-01-09 | Disposition: A | Discharge status: Home / Self Care | Payer: Medicare Other | Source: Ambulatory Visit | Attending: Pulmonary Disease | Admitting: Pulmonary Disease

## 2012-01-09 DIAGNOSIS — Z0000 Encounter for general adult medical examination without abnormal findings: Secondary | ICD-10-CM

## 2012-01-20 ENCOUNTER — Encounter: Payer: Self-pay | Admitting: Pulmonary Disease

## 2012-01-20 NOTE — Progress Notes (Signed)
Subjective:     Patient ID: Howard Fisher, male   DOB: April 10, 1941, 71 y.o.   MRN: [id number]  HPI 71 y/o WM here for a follow up visit... he has multiple medical problems as noted below...    ~  May10:  he failed DOT exam at [facility] 4/10 w/ BP ~150/100... saw TP and started on LISINOPRIL 10mg /d... not checking BP at home... BP sl better on the Lisinopril and he wants to continue this med...  ** REC> increase the Lisinopril to 20mg /d, & get weight down!  ~  ZHY86:  he's had a good 6 months- no new complaints or concerns... had right cat surg DrEpes 12/10... BP controlled w/ Lisinoprol20 & wt down 10#... Chol OK on Simva80, and BS OK on diet alone... we discussed need for f/u colon but he declines at this time (will do stool cards- and call GI at his convenience), and refuses Flu shots.  ~  December 30, 2010:  Yearly ROV & review- feeling well, no new complaints or concerns, just same mild urinary symptoms w/ freq/ hesitancy but not bad enough for GU eval he says... 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 He declines the flu vaccine but will accept the TDAP today;  See prob list below>>          Problem List:   ALLERGIC RHINITIS (ICD-477.9) - uses OTC meds Prn...  Hx of SINUSITIS (ICD-473.9)  ? of DYSPNEA (ICD-786.05) - see above... symptoms resolved w/ Alprazolam Rx... He is encouraged to incr exercise program. ~  Baseline CXR w/ scarring LLL, DJD TSpine, otherw WNL.[name]. last film 12/09  ~  PFT's 12/09= WNL.Marland KitchenMarland Kitchen FVC=6.14 (126%), FEV1= 4.94 (150%), %1sec= 80, mid-flows= 174%... ~  CXR 1/12 showed clear lungs, NAD.[name]. ~  CXR 1/13 showed normal heart size, clear lungs, DJD TSpine, NAD...  HYPERTENSION (ICD-401.9) - on ASA 81mg /d, & LISINOPRIL 20mg /d...  ~  1/12: BP = 132/82 & similar at home... he is asymptomatic- denies HA, fatigue, visual changes, CP, palipit, dizziness,  syncope, dyspnea, edema, etc...  ~  1/13: BP= 140/90 & even better at home he says; reminded about diet, exercise & wt reduction strategy... ~  EKG 1/13 showed NSR, rate66, WNL/ NAD...  HYPERCHOLESTEROLEMIA (ICD-272.0) - on SIMVASTATIN80- taking 1/2 tab Qhs... ~  FLP 9/08 showed TChol 133, TG 95, HDL 33, LDL 81 ~  FLP 12/09 showed TChol 177, TG 153, HDL 44, LDL 102... encouraged to take Simva daily. ~  FLP 1/11 showed TChol 152, TG 137, HDL 43, LDL 82 ~  FLP 1/12 on Simva40 showed TChol 147, TG 130, HDL 37, LDL 85 ~  FLP 1/13 on Simva40 showed TChol 161, TG 152, HDL 45, LDL 86... Needs better diet, same med, get wt down.  DIABETES MELLITUS, BORDERLINE (ICD-790.29) - he has been counselled on low carb/ weight reducing diet + exercise program... ~  labs throught the decade of the 2000's showed FBS from 109-121 ~  labs 12/09 showed FBS= 127... rec to avoid sweets and lose weight. ~  labs 1/11 showed FBS= 108, A1c= 6.1 ~  labs 1/12 showed BS= 111, A1c= 6.3 ~  Labs 1/13 showed BS= 124, A1c= 6.4... Still ok on diet alone, but needs to get wt down.  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 he required dilatation in 1988 &  1992...  ~  last EGD 1995 showed large HH, GERD w/ ulcerative inflamm, Bx for HPylori= neg. ~  last colonoscopy 11/97 was negative- he is currently overdue for screening colon f/u... ~  1/13:  We will refer chart to GI for f/u colonoscopy...  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 ~  GU:  stable w/ PSA= 0.58 ~  Immunizations:     Past Surgical History  Procedure Date  . Cholecystectomy 02/1993    Dr. Gerrit Friends  . Right hernia repair 2002    Dr. Orson Slick  . Right cataract  surgery 11/2009    Dr. Emmit Pomfret    Outpatient Encounter Prescriptions as of 01/03/2012  Medication Sig Dispense Refill  . aspirin 81 MG tablet Take 81 mg by mouth daily.       [name] lisinopril (PRINIVIL,ZESTRIL) 20 MG tablet Take 1 tablet (20 mg total) by mouth daily.  90 tablet  0  . omeprazole (PRILOSEC) 20 MG capsule Take 1 capsule (20 mg total) by mouth daily.  90 capsule  0  . simvastatin (ZOCOR) 80 MG tablet Take 1 tablet (80 mg total) by mouth at bedtime.  90 tablet  0  . DISCONTD: lisinopril (PRINIVIL,ZESTRIL) 20 MG tablet Take 20 mg by mouth daily.        [name] DISCONTD: omeprazole (PRILOSEC) 20 MG capsule Take 20 mg by mouth daily.        [name] DISCONTD: simvastatin (ZOCOR) 80 MG tablet TAKE 1 TABLET BY MOUTH AT BEDTIME  90 tablet  3  . DISCONTD: sildenafil (VIAGRA) 100 MG tablet Take 1 tablet (100 mg total) by mouth as needed.  10 tablet  prn    Allergies  Allergen Reactions  . Penicillins     REACTION: rash and itching    Current Medications, Allergies, Past Medical History, Past Surgical History, Family History, and Social History were reviewed in [employer] record.    Review of Systems        See HPI - all other systems neg except as noted...  The patient denies anorexia, fever, weight loss, weight gain, vision loss, decreased hearing, hoarseness, chest pain, syncope, dyspnea on exertion, peripheral edema, prolonged cough, headaches, hemoptysis, abdominal pain, melena, hematochezia, severe indigestion/heartburn, hematuria, incontinence, muscle weakness, suspicious skin lesions, transient blindness, difficulty walking, depression, unusual weight change, abnormal bleeding, enlarged lymph nodes, and angioedema.     Objective:   Physical Exam     WD, WN, 71 y/o WM in NAD... GENERAL:  Alert & oriented; pleasant & cooperative... HEENT:  [place]/AT, EOM-wnl, PERRLA, EACs-clear, TMs-wnl, NOSE-clear, THROAT-clear & wnl. NECK:  Supple w/ fairROM; no JVD; normal carotid  impulses w/o bruits; no thyromegaly or nodules palpated; no lymphadenopathy. CHEST:  Clear to P & A; without wheezes/ rales/ or rhonchi heard... HEART:  Regular Rhythm; without murmurs/ rubs/ or gallops detected... ABDOMEN:  Soft & nontender; normal bowel sounds; no organomegaly or masses palpated... EXT: without deformities or arthritic changes; no varicose veins/ venous insuffic/ or edema. NEURO:  CN's intact; motor testing normal; sensory testing normal; gait normal & balance OK. DERM:  No lesions noted; no rash etc...  RADIOLOGY DATA:  Reviewed in the EPIC EMR & discussed w/  the patient...    >>CXR 1/13 showed normal heart size, clear lungs, DJD TSpine, NAD...    >>EKG 1/13 showed  NSR, rate66, WNL/ NAD...  LABORATORY DATA:  Reviewed in the EPIC EMR & discussed w/ the patient...   Assessment:     Hx of Dyspnea>  Prev responded to Alparz; now that he is retired I have reiterated the rec for incr exercise program...  HBP>  Controlled on ACE, tol well, continue same/ no salt/ get wt down...  CHOL>  Controlled on diet & Simva40;  Continue same...  DM>  Controlled on diet alone; we reviewed diet & healthy choices...  HH & GERD>  Stable on Omep, no dysphagia reported; he is overdue for colonoscopy & we will refer to GI...  Hx Peyronies>  Aware, he requested Viagra for ED...  LBP>  Aware, he plays golf & we reviewed back/ ab strengthening exercises...  Other medical issues as noted...    Plan:     Patient's Medications  New Prescriptions   No medications on file  Previous Medications   ASPIRIN 81 MG TABLET    Take 81 mg by mouth daily.   Modified Medications   Modified Medication Previous Medication   LISINOPRIL (PRINIVIL,ZESTRIL) 20 MG TABLET lisinopril (PRINIVIL,ZESTRIL) 20 MG tablet      Take 1 tablet (20 mg total) by mouth daily.    Take 20 mg by mouth daily.     OMEPRAZOLE (PRILOSEC) 20 MG CAPSULE omeprazole (PRILOSEC) 20 MG capsule      Take 1 capsule (20 mg total) by  mouth daily.    Take 20 mg by mouth daily.     SIMVASTATIN (ZOCOR) 80 MG TABLET simvastatin (ZOCOR) 80 MG tablet      Take 1 tablet (80 mg total) by mouth at bedtime.    TAKE 1 TABLET BY MOUTH AT BEDTIME  Discontinued Medications   SILDENAFIL (VIAGRA) 100 MG TABLET    Take 1 tablet (100 mg total) by mouth as needed.

## 2012-01-22 ENCOUNTER — Other Ambulatory Visit: Payer: Self-pay | Admitting: Pulmonary Disease

## 2012-01-22 DIAGNOSIS — K219 Gastro-esophageal reflux disease without esophagitis: Secondary | ICD-10-CM

## 2012-02-06 ENCOUNTER — Encounter: Payer: Self-pay | Admitting: Gastroenterology

## 2012-02-18 ENCOUNTER — Other Ambulatory Visit: Payer: Self-pay | Admitting: Pulmonary Disease

## 2012-02-28 ENCOUNTER — Encounter: Payer: Self-pay | Admitting: Gastroenterology

## 2012-04-05 ENCOUNTER — Other Ambulatory Visit: Payer: Self-pay | Admitting: Pulmonary Disease

## 2012-04-10 ENCOUNTER — Other Ambulatory Visit: Payer: Self-pay | Admitting: Pulmonary Disease

## 2012-04-12 ENCOUNTER — Other Ambulatory Visit: Payer: Self-pay | Admitting: Pulmonary Disease

## 2012-06-10 ENCOUNTER — Telehealth: Payer: Self-pay | Admitting: Pulmonary Disease

## 2012-06-10 MED ORDER — AZITHROMYCIN 250 MG PO TABS: ORAL_TABLET | ORAL | Status: DC

## 2012-06-10 NOTE — Telephone Encounter (Signed)
Per SN--zpak #1  Take as directed with no refills, mucinex 2 po bid with plenty of fluids and gatordade.  thanks

## 2012-06-10 NOTE — Telephone Encounter (Signed)
Pt aware of SN recs. Rx has been sent to the pharmacy

## 2012-06-10 NOTE — Telephone Encounter (Signed)
Spoke with pt. He c/o feeling dizzy upon standing for the past wk. He states also having some rt side facial pressure and ear pain. Would like something called in. Please advise, thanks! Allergies  Allergen Reactions  . Penicillins     REACTION: rash and itching

## 2012-07-10 ENCOUNTER — Telehealth: Payer: Self-pay | Admitting: Pulmonary Disease

## 2012-07-10 MED ORDER — LISINOPRIL 20 MG PO TABS: 20.0000 mg | ORAL_TABLET | Freq: Every day | ORAL | Status: DC

## 2012-07-10 MED ORDER — SIMVASTATIN 80 MG PO TABS: 80.0000 mg | ORAL_TABLET | Freq: Every day | ORAL | Status: DC

## 2012-07-10 MED ORDER — OMEPRAZOLE 20 MG PO CPDR: DELAYED_RELEASE_CAPSULE | ORAL | Status: DC

## 2012-07-10 NOTE — Telephone Encounter (Signed)
error 

## 2012-07-10 NOTE — Telephone Encounter (Signed)
I spoke with spouse and she is requesting pt 3 rx's as stated above be mailed to their home address so when pt is needing this refilled they can mail this into the mail order pharmacy. I have printed off rx for SN to sign

## 2012-07-11 NOTE — Telephone Encounter (Signed)
rx have been signed by SN and placed in the mail to the pts home per pts request. Nothing further is needed.

## 2012-07-24 ENCOUNTER — Telehealth: Payer: Self-pay | Admitting: Pulmonary Disease

## 2012-07-24 MED ORDER — SIMVASTATIN 40 MG PO TABS: 40.0000 mg | ORAL_TABLET | Freq: Every day | ORAL | Status: DC

## 2012-07-24 NOTE — Telephone Encounter (Signed)
New rx has been [facility] to [facility]. Pt is aware of the change bc he states he has been taking 80 mg a day. Nothing further was needed

## 2012-07-24 NOTE — Telephone Encounter (Signed)
Per SN---ok to change to the simvastatin 40 mg  Daily. thanks

## 2012-07-24 NOTE — Telephone Encounter (Signed)
The pharmacy needs clarification on simvastatin prescription. Rx is written for simvastatin 80mg  once daily, but last OV note states the pt takes 1/2 tablet daily. Pharmacy states that 80mg  is too high of a dose. Please advise. Carron Curie, CMA

## 2012-12-25 DIAGNOSIS — C439 Malignant melanoma of skin, unspecified: Secondary | ICD-10-CM

## 2012-12-25 HISTORY — DX: Malignant melanoma of skin, unspecified: C43.9

## 2012-12-25 HISTORY — PX: OTHER SURGICAL HISTORY: SHX169

## 2013-01-02 ENCOUNTER — Other Ambulatory Visit (INDEPENDENT_AMBULATORY_CARE_PROVIDER_SITE_OTHER): Payer: Medicare Other

## 2013-01-02 ENCOUNTER — Encounter: Payer: Self-pay | Admitting: Pulmonary Disease

## 2013-01-02 ENCOUNTER — Ambulatory Visit (INDEPENDENT_AMBULATORY_CARE_PROVIDER_SITE_OTHER): Payer: Medicare Other | Admitting: Pulmonary Disease

## 2013-01-02 VITALS — BP 140/92 | HR 64 | Temp 97.5°F | Ht 73.0 in | Wt 239.8 lb

## 2013-01-02 DIAGNOSIS — R7309 Other abnormal glucose: Secondary | ICD-10-CM

## 2013-01-02 DIAGNOSIS — I10 Essential (primary) hypertension: Secondary | ICD-10-CM

## 2013-01-02 DIAGNOSIS — J309 Allergic rhinitis, unspecified: Secondary | ICD-10-CM

## 2013-01-02 DIAGNOSIS — K219 Gastro-esophageal reflux disease without esophagitis: Secondary | ICD-10-CM

## 2013-01-02 DIAGNOSIS — K449 Diaphragmatic hernia without obstruction or gangrene: Secondary | ICD-10-CM

## 2013-01-02 DIAGNOSIS — N4889 Other specified disorders of penis: Secondary | ICD-10-CM

## 2013-01-02 DIAGNOSIS — E663 Overweight: Secondary | ICD-10-CM

## 2013-01-02 DIAGNOSIS — E7800 Pure hypercholesterolemia, unspecified: Secondary | ICD-10-CM

## 2013-01-02 DIAGNOSIS — N320 Bladder-neck obstruction: Secondary | ICD-10-CM

## 2013-01-02 DIAGNOSIS — M545 Low back pain, unspecified: Secondary | ICD-10-CM

## 2013-01-02 LAB — CBC WITH DIFFERENTIAL/PLATELET
Basophils Relative: 0.3 % (ref 0.0–3.0)
Eosinophils Relative: 2.1 % (ref 0.0–5.0)
Hemoglobin: 16.7 g/dL (ref 13.0–17.0)
Lymphocytes Relative: 30 % (ref 12.0–46.0)
MCHC: 34 g/dL (ref 30.0–36.0)
Monocytes Relative: 9.3 % (ref 3.0–12.0)
Neutro Abs: 5 10*3/uL (ref 1.4–7.7)
RBC: 5.21 Mil/uL (ref 4.22–5.81)
WBC: 8.5 10*3/uL (ref 4.5–10.5)

## 2013-01-02 LAB — HEPATIC FUNCTION PANEL
Albumin: 4.2 g/dL (ref 3.5–5.2)
Bilirubin, Direct: 0.2 mg/dL (ref 0.0–0.3)
Total Protein: 7.2 g/dL (ref 6.0–8.3)

## 2013-01-02 LAB — BASIC METABOLIC PANEL
CO2: 30 mEq/L (ref 19–32)
Calcium: 9.4 mg/dL (ref 8.4–10.5)
Creatinine, Ser: 1.1 mg/dL (ref 0.4–1.5)

## 2013-01-02 LAB — LIPID PANEL
HDL: 39 mg/dL — ABNORMAL LOW (ref >39.00)
Total CHOL/HDL Ratio: 4
Triglycerides: 144 mg/dL (ref 0.0–149.0)

## 2013-01-02 LAB — HEMOGLOBIN A1C: Hgb A1c MFr Bld: 6.6 % — ABNORMAL HIGH (ref 4.6–6.5)

## 2013-01-02 MED ORDER — SIMVASTATIN 40 MG PO TABS: 40.0000 mg | ORAL_TABLET | Freq: Every day | ORAL | Status: DC

## 2013-01-02 MED ORDER — LISINOPRIL 20 MG PO TABS: 20.0000 mg | ORAL_TABLET | Freq: Every day | ORAL | Status: DC

## 2013-01-02 MED ORDER — OMEPRAZOLE 20 MG PO CPDR: 20.0000 mg | DELAYED_RELEASE_CAPSULE | Freq: Every day | ORAL | Status: DC

## 2013-01-02 NOTE — Progress Notes (Signed)
Subjective:     Patient ID: Howard Fisher, male   DOB: 09/14/1941, 72 y.o.   MRN: [id number]  HPI 72 y/o WM here for a follow up visit... he has multiple medical problems as noted below...    ~  May10:  he failed DOT exam at [facility] 4/10 w/ BP ~150/100... saw TP and started on LISINOPRIL 10mg /d... not checking BP at home... BP sl better on the Lisinopril and he wants to continue this med...  REC> increase the Lisinopril to 20mg /d, & get weight down!  ~  Jan11:  he's had a good 6 months- no new complaints or concerns... had right cat surg DrEpes 12/10... BP controlled w/ Lisinoprol20 & wt down 10#... Chol OK on Simva80, and BS OK on diet alone... we discussed need for f/u colon but he declines at this time (will do stool cards- and call GI at his convenience), and refuses Flu shots.  ~  December 30, 2010:  Yearly ROV & review- feeling well, no new complaints or concerns, just same mild urinary symptoms w/ freq/ hesitancy but not bad enough for GU eval he says... 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  He declines the flu vaccine but will accept the TDAP today;  See prob list below>>  ~  January 02, 2013:  1 year ROV & Jaylen continues to do well- no new complaints or concerns; BP well controlled on Lisinopril20;  FLP remains at goals on Simva40;  Hx HH/ GERD on Prilosec20 & he denies abd pain, n/v, c/d, blood seen- he continues to f/u decline colonoscopy "I hate the prep", we reviewed alternative preps & he'll think about it...    We reviewed prob list, meds, xrays and labs> see below for updates >> he continues to decline the Flu vaccine... LABS 1/14:  FLP- at goals on Simva40;  Chems- ok x BS=134, A1c=6.6 on diet;  CBC- wnl;  TSH=2.79;  PSA=0.75           Problem List:   ALLERGIC RHINITIS (ICD-477.9) - uses OTC meds Prn...  Hx of SINUSITIS (ICD-473.9)  ? of DYSPNEA  (ICD-786.05) - see above... symptoms resolved w/ Alprazolam Rx... He is encouraged to incr exercise program. ~  Baseline CXR w/ scarring LLL, DJD TSpine, otherw WNL.[name]. last film 12/09  ~  PFT's 12/09= WNL.Marland KitchenMarland Kitchen FVC=6.14 (126%), FEV1= 4.94 (150%), %1sec= 80, mid-flows= 174%... ~  CXR 1/12 showed clear lungs, NAD.[name]. ~  CXR 1/13 showed normal heart size, clear lungs, DJD TSpine, NAD...  HYPERTENSION (ICD-401.9) - on ASA 81mg /d, & LISINOPRIL 20mg /d...  ~  1/12: BP = 132/82 & similar at home... he is asymptomatic- denies HA, fatigue, visual changes, CP, palipit, dizziness, syncope, dyspnea, edema, etc...  ~  1/13: BP= 140/90 & even better at home he says; reminded about diet, exercise & wt reduction strategy... ~  EKG 1/13 showed NSR, rate66, WNL/ NAD... ~  1/14:  BP= 140/90 & he denies CP, palpit, dizzy, SOB, edema...  HYPERCHOLESTEROLEMIA (ICD-272.0) - on SIMVASTATIN80- taking 1/2 tab Qhs... ~  FLP 9/08 showed TChol 133, TG 95, HDL 33, LDL 81 ~  FLP 12/09 showed TChol 177, TG 153, HDL 44, LDL 102... encouraged to take Simva daily. ~  FLP 1/11 showed TChol 152, TG 137, HDL 43, LDL 82 ~  FLP 1/12 on Simva40 showed TChol 147, TG 130,  HDL 37, LDL 85 ~  FLP 1/13 on Simva40 showed TChol 161, TG 152, HDL 45, LDL 86... Needs better diet, same med, get wt down. 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 ~  labs throught the decade of the 2000's showed FBS from 109-121 ~  labs 12/09 showed FBS= 127... rec to avoid sweets and lose weight. ~  labs 1/11 showed FBS= 108, A1c= 6.1 ~  labs 1/12 showed BS= 111, A1c= 6.3 ~  Labs 1/13 showed BS= 124, A1c= 6.4... Still ok on diet alone, but needs to get wt down. 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 he required dilatation in 1988 & 1992...  ~  last  EGD 1995 showed large HH, GERD w/ ulcerative inflamm, Bx for HPylori= neg. ~  last colonoscopy 11/97 was negative- he is currently overdue for screening colon f/u... ~  1/13:  We will refer chart to GI for f/u colonoscopy...  Hx of PEYRONIE'S DISEASE [phone]) & ED - uses Viagra Prn... 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 ~  GU:  stable w/ PSA= 0.58 ~  Immunizations:  He declines the seasonal Flu vaccine;  TDAP given 1/13   Past Surgical History  Procedure Date  . Cholecystectomy 02/1993    Dr. Gerrit Friends  . Right hernia repair 2002    Dr. Orson Slick  . Right cataract surgery 11/2009    Dr. Emmit Pomfret    Outpatient Encounter Prescriptions as of 01/02/2013  Medication Sig Dispense Refill  . aspirin 81 MG tablet Take 81 mg by mouth daily.       [name] lisinopril (PRINIVIL,ZESTRIL) 20 MG tablet TAKE 1 TABLET BY MOUTH EVERY DAY  90 tablet  3  . omeprazole (PRILOSEC) 20 MG capsule TAKE ONE CAPSULE BY MOUTH EVERY DAY  90 capsule  3  . simvastatin (ZOCOR) 40 MG tablet Take 1 tablet (40 mg total) by mouth at bedtime.  90 tablet  3  . [DISCONTINUED] azithromycin (ZITHROMAX) 250 MG tablet Take as directed  6 tablet  0  . [DISCONTINUED] lisinopril (PRINIVIL,ZESTRIL) 20 MG tablet Take 1 tablet (20 mg total) by mouth daily.  90 tablet  3  . [DISCONTINUED] omeprazole (PRILOSEC) 20 MG capsule Take 1 capsule  By mouth 1/2 hour before eating  90 capsule  3    Allergies  Allergen Reactions  . Penicillins     REACTION: rash and itching    Current Medications, Allergies, Past Medical History, Past Surgical History, Family History, and Social History were reviewed in [employer] record.    Review of Systems         See HPI - all other systems neg except as noted...  The patient  denies anorexia, fever, weight loss, weight gain, vision loss, decreased hearing, hoarseness, chest pain, syncope, dyspnea on exertion, peripheral edema, prolonged cough, headaches, hemoptysis, abdominal pain, melena, hematochezia, severe indigestion/heartburn, hematuria, incontinence, muscle weakness, suspicious skin lesions, transient blindness, difficulty walking, depression, unusual weight change, abnormal bleeding, enlarged lymph nodes, and angioedema.     Objective:  Physical Exam     WD, WN, 72 y/o WM in NAD... GENERAL:  Alert & oriented; pleasant & cooperative... HEENT:  [place]/AT, EOM-wnl, PERRLA, EACs-clear, TMs-wnl, NOSE-clear, THROAT-clear & wnl. NECK:  Supple w/ fairROM; no JVD; normal carotid impulses w/o bruits; no thyromegaly or nodules palpated; no lymphadenopathy. CHEST:  Clear to P & A; without wheezes/ rales/ or rhonchi heard... HEART:  Regular Rhythm; without murmurs/ rubs/ or gallops detected... ABDOMEN:  Soft & nontender; normal bowel sounds; no organomegaly or masses palpated... EXT: without deformities or arthritic changes; no varicose veins/ venous insuffic/ or edema. NEURO:  CN's intact; motor testing normal; sensory testing normal; gait normal & balance OK. DERM:  No lesions noted; no rash etc...  RADIOLOGY DATA:  Reviewed in the EPIC EMR & discussed w/ the patient...    >>CXR 1/13 showed normal heart size, clear lungs, DJD TSpine, NAD...    >>EKG 1/13 showed  NSR, rate66, WNL/ NAD...  LABORATORY DATA:  Reviewed in the EPIC EMR & discussed w/ the patient...   Assessment:      Hx of Dyspnea>  Prev responded to Alparz; now that he is retired I have reiterated the rec for incr exercise program...  HBP>  Controlled on ACE, tol well, continue same/ no salt/ get wt down...  CHOL>  Controlled on diet & Simva40;  Continue same...  DM>  Controlled on diet alone; we reviewed diet & healthy choices...  HH & GERD>  Stable on Omep, no dysphagia reported; he is overdue  for colonoscopy & we will refer to GI...  Hx Peyronies>  Aware, he requested Viagra for ED...  LBP>  Aware, he plays golf & we reviewed back/ ab strengthening exercises...  Other medical issues as noted...     Plan:     Patient's Medications  New Prescriptions   No medications on file  Previous Medications   ASPIRIN 81 MG TABLET    Take 81 mg by mouth daily.   Modified Medications   Modified Medication Previous Medication   LISINOPRIL (PRINIVIL,ZESTRIL) 20 MG TABLET lisinopril (PRINIVIL,ZESTRIL) 20 MG tablet      Take 1 tablet (20 mg total) by mouth daily.    TAKE 1 TABLET BY MOUTH EVERY DAY   OMEPRAZOLE (PRILOSEC) 20 MG CAPSULE omeprazole (PRILOSEC) 20 MG capsule      Take 1 capsule (20 mg total) by mouth daily.    TAKE ONE CAPSULE BY MOUTH EVERY DAY   SIMVASTATIN (ZOCOR) 40 MG TABLET simvastatin (ZOCOR) 40 MG tablet      Take 1 tablet (40 mg total) by mouth at bedtime.    Take 1 tablet (40 mg total) by mouth at bedtime.  Discontinued Medications   AZITHROMYCIN (ZITHROMAX) 250 MG TABLET    Take as directed   LISINOPRIL (PRINIVIL,ZESTRIL) 20 MG TABLET    Take 1 tablet (20 mg total) by mouth daily.   OMEPRAZOLE (PRILOSEC) 20 MG CAPSULE    Take 1 capsule  By mouth 1/2 hour before eating

## 2013-01-02 NOTE — Patient Instructions (Addendum)
Today we updated your med list in our EPIC system...    Continue your current medications the same...    We refilled your meds per request...  Today we did your follow up FASTING blood work...    We will contact you w/ the results when avail...  Keep up the good work w/ your DIET & exercise program...    The goal is to lose 15-20 lbs!  When you are ready- call out GI Dept at [phone] to schedule your colonoscopy...  Call for any questions...  Let's continue our yearly check-ups, but call anytime as needed for problems.Marland KitchenMarland Kitchen

## 2013-01-03 ENCOUNTER — Telehealth: Payer: Self-pay | Admitting: Pulmonary Disease

## 2013-01-03 NOTE — Telephone Encounter (Signed)
Please notify patient> FLP looks good on the Simva40 + low chol/ low fat diet... Chems ok x BS=134 A1c=6.6 & getting close to needed DM meds, he can avoid by diet- no sugars, no sweets- & wt reduction... CBC, Thyrois, PSA> all wnl...  Spoke with pt and notified of results per Dr. Kriste Basque. Pt verbalized understanding and denied any questions.

## 2013-01-03 NOTE — Progress Notes (Signed)
Quick Note:  Spoke with pt and notified of results per Dr. Nadel. Pt verbalized understanding and denied any questions.  ______ 

## 2013-01-20 ENCOUNTER — Ambulatory Visit (INDEPENDENT_AMBULATORY_CARE_PROVIDER_SITE_OTHER): Payer: Medicare Other | Admitting: Adult Health

## 2013-01-20 ENCOUNTER — Encounter: Payer: Self-pay | Admitting: Adult Health

## 2013-01-20 VITALS — BP 140/84 | HR 65 | Temp 98.0°F | Ht 73.0 in | Wt 240.0 lb

## 2013-01-20 DIAGNOSIS — M26629 Arthralgia of temporomandibular joint, unspecified side: Secondary | ICD-10-CM

## 2013-01-20 NOTE — Patient Instructions (Addendum)
Advil 200mg  2 tabs Twice daily  For 4 days w/ food  Warm compresses to jaw Twice daily  As needed   Please contact office for sooner follow up if symptoms do not improve or worsen or seek emergency care

## 2013-01-20 NOTE — Assessment & Plan Note (Signed)
Suspect mild TMJ pain -seems to be resolving.  Exam is unrevealing.  Advised on conservative tx.   Plan  Advil 200mg  2 tabs Twice daily  For 4 days w/ food  Warm compresses to jaw Twice daily  As needed   Please contact office for sooner follow up if symptoms do not improve or worsen or seek emergency care

## 2013-01-20 NOTE — Progress Notes (Signed)
Subjective:     Patient ID: Howard Fisher, male   DOB: 1941-05-18, 72 y.o.   MRN: [id number]  HPI 72 y/o w/ known hx of HTN   01/20/2013 Acute OV  Complains of right ear discomfort/pressure w/ occasional ringing, and right jaw discomfort w/ eating x1week.  denies vision changes, chest pain. Initially had pain /tendernss along upper jaw/ear area mainly with eating.  Has improved over last 2 days. No pain today .  No fever, sinus congestion or chest pain.               Problem List:   ALLERGIC RHINITIS (ICD-477.9) - uses OTC meds Prn...  Hx of SINUSITIS (ICD-473.9)  ? of DYSPNEA (ICD-786.05) - see above... symptoms resolved w/ Alprazolam Rx... He is encouraged to incr exercise program. ~  Baseline CXR w/ scarring LLL, DJD TSpine, otherw WNL.[name]. last film 12/09  ~  PFT's 12/09= WNL.Marland KitchenMarland Kitchen FVC=6.14 (126%), FEV1= 4.94 (150%), %1sec= 80, mid-flows= 174%... ~  CXR 1/12 showed clear lungs, NAD.[name]. ~  CXR 1/13 showed normal heart size, clear lungs, DJD TSpine, NAD...  HYPERTENSION (ICD-401.9) - on ASA 81mg /d, & LISINOPRIL 20mg /d...  ~  1/12: BP = 132/82 & similar at home... he is asymptomatic- denies HA, fatigue, visual changes, CP, palipit, dizziness, syncope, dyspnea, edema, etc...  ~  1/13: BP= 140/90 & even better at home he says; reminded about diet, exercise & wt reduction strategy... ~  EKG 1/13 showed NSR, rate66, WNL/ NAD...  HYPERCHOLESTEROLEMIA (ICD-272.0) - on SIMVASTATIN80- taking 1/2 tab Qhs... ~  FLP 9/08 showed TChol 133, TG 95, HDL 33, LDL 81 ~  FLP 12/09 showed TChol 177, TG 153, HDL 44, LDL 102... encouraged to take Simva daily. ~  FLP 1/11 showed TChol 152, TG 137, HDL 43, LDL 82 ~  FLP 1/12 on Simva40 showed TChol 147, TG 130, HDL 37, LDL 85 ~  FLP 1/13 on Simva40 showed TChol 161, TG 152, HDL 45, LDL 86... Needs better diet, same med, get wt down.  DIABETES MELLITUS, BORDERLINE (ICD-790.29) - he has been counselled on low carb/ weight reducing diet + exercise  program... ~  labs throught the decade of the 2000's showed FBS from 109-121 ~  labs 12/09 showed FBS= 127... rec to avoid sweets and lose weight. ~  labs 1/11 showed FBS= 108, A1c= 6.1 ~  labs 1/12 showed BS= 111, A1c= 6.3 ~  Labs 1/13 showed BS= 124, A1c= 6.4... Still ok on diet alone, but needs to get wt down.  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 he required dilatation in 1988 & 1992...  ~  last EGD 1995 showed large HH, GERD w/ ulcerative inflamm, Bx for HPylori= neg. ~  last colonoscopy 11/97 was negative- he is currently overdue for screening colon f/u... ~  1/13:  We will refer chart to GI for f/u colonoscopy...  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 ~  GU:  stable w/ PSA= 0.58 ~  Immunizations:     Past Surgical History  Procedure Date  . Cholecystectomy 02/1993    Dr. Gerrit Friends  . Right hernia repair 2002    Dr. Orson Slick  . Right cataract surgery 11/2009  Dr. Emmit Pomfret    Outpatient Encounter Prescriptions as of 01/20/2013  Medication Sig Dispense Refill  . aspirin 81 MG tablet Take 81 mg by mouth daily.       [name] lisinopril (PRINIVIL,ZESTRIL) 20 MG tablet Take 1 tablet (20 mg total) by mouth daily.  90 tablet  3  . omeprazole (PRILOSEC) 20 MG capsule Take 1 capsule (20 mg total) by mouth daily.  90 capsule  3  . simvastatin (ZOCOR) 40 MG tablet Take 1 tablet (40 mg total) by mouth at bedtime.  90 tablet  3    Allergies  Allergen Reactions  . Penicillins     REACTION: rash and itching    Current Medications, Allergies, Past Medical History, Past Surgical History, Family History, and Social History were reviewed in [employer] record.    Review of  Systems         See HPI - all other systems neg except as noted...  The patient denies anorexia, fever, weight loss, weight gain, vision loss, decreased hearing, hoarseness, chest pain, syncope, dyspnea on exertion, peripheral edema, prolonged cough, headaches, hemoptysis, abdominal pain, melena, hematochezia, severe indigestion/heartburn, hematuria, incontinence, muscle weakness, suspicious skin lesions, transient blindness, difficulty walking, depression, unusual weight change, abnormal bleeding, enlarged lymph nodes, and angioedema.     Objective:   Physical Exam     WD, WN, 72 y/o WM in NAD... GENERAL:  Alert & oriented; pleasant & cooperative... HEENT:  [place]/AT, EOM-wnl, PERRLA, EACs-clear, TMs-wnl, NOSE-clear, THROAT-clear & wnl. Mild tenderness along right upper jaw/TMJ area .  NECK:  Supple w/ fairROM; no JVD; normal carotid impulses w/o bruits; no thyromegaly or nodules palpated; no lymphadenopathy. CHEST:  Clear to P & A; without wheezes/ rales/ or rhonchi heard... HEART:  Regular Rhythm; without murmurs/ rubs/ or gallops detected... ABDOMEN:  Soft & nontender; normal bowel sounds; no organomegaly or masses palpated... EXT: without deformities or arthritic changes; no varicose veins/ venous insuffic/ or edema. NEURO:  CN's intact; motor testing normal; sensory testing normal; gait normal & balance OK. DERM:  No lesions note   Assessment:

## 2013-03-20 ENCOUNTER — Encounter: Payer: Self-pay | Admitting: Pulmonary Disease

## 2013-03-20 ENCOUNTER — Ambulatory Visit (INDEPENDENT_AMBULATORY_CARE_PROVIDER_SITE_OTHER): Payer: Medicare Other | Admitting: Pulmonary Disease

## 2013-03-20 VITALS — BP 140/80 | HR 88 | Temp 98.6°F | Resp 16 | Ht 73.0 in | Wt 242.0 lb

## 2013-03-20 DIAGNOSIS — E7800 Pure hypercholesterolemia, unspecified: Secondary | ICD-10-CM

## 2013-03-20 DIAGNOSIS — S43499A Other sprain of unspecified shoulder joint, initial encounter: Secondary | ICD-10-CM

## 2013-03-20 DIAGNOSIS — K219 Gastro-esophageal reflux disease without esophagitis: Secondary | ICD-10-CM

## 2013-03-20 DIAGNOSIS — R7309 Other abnormal glucose: Secondary | ICD-10-CM

## 2013-03-20 DIAGNOSIS — S46211A Strain of muscle, fascia and tendon of other parts of biceps, right arm, initial encounter: Secondary | ICD-10-CM

## 2013-03-20 DIAGNOSIS — S46819A Strain of other muscles, fascia and tendons at shoulder and upper arm level, unspecified arm, initial encounter: Secondary | ICD-10-CM

## 2013-03-20 DIAGNOSIS — I10 Essential (primary) hypertension: Secondary | ICD-10-CM

## 2013-03-20 MED ORDER — FLUOCINONIDE-E 0.05 % EX CREA: TOPICAL_CREAM | CUTANEOUS | Status: DC | PRN

## 2013-03-20 NOTE — Progress Notes (Signed)
Subjective:     Patient ID: Howard Fisher, male   DOB: Oct 10, 1941, 72 y.o.   MRN: [id number]  HPI 72 y/o WM here for a follow up visit... he has multiple medical problems as noted below...    ~  December 30, 2010:  Yearly ROV & review- feeling well, no new complaints or concerns, just same mild urinary symptoms w/ freq/ hesitancy but not bad enough for GU eval he says... 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  He declines the flu vaccine but will accept the TDAP today;  See prob list below>>  ~  January 02, 2013:  1 year ROV & Howard Fisher continues to do well- no new complaints or concerns; BP well controlled on Lisinopril20;  FLP remains at goals on Simva40;  Hx HH/ GERD on Prilosec20 & he denies abd pain, n/v, c/d, blood seen- he continues to f/u decline colonoscopy "I hate the prep", we reviewed alternative preps & he'll think about it...    We reviewed prob list, meds, xrays and labs> see below for updates >> he continues to decline the Flu vaccine... LABS 1/14:  FLP- at goals on Simva40;  Chems- ok x BS=134, A1c=6.6 on diet;  CBC- wnl;  TSH=2.79;  PSA=0.75   ~  March 20, 2013:  56mo ROV & add-on at pt rquest for 2 problems:  1) mild rash on right upper arm that looks like a dermatitis- no shingles- mildly pruritic, no adenopathy, etc; we discussed Rx w/ Lidex-E cream & call if not resolved...  2) about 70yr ago he fell (slid on buttocks) & braced his fall w/ both arms behind him- had some bilat shoulder pain that resolved over time but noted a change in his bicep muscles- characteristic a ruptured bicep tendon; he has excellent strength in arms, norm ROM, etc; he is reassured & nothing further needs to be done (offered Ortho eval but he declines)...     He has put on a few lbs buty looking forward to incr exercise this spring;  BP controlled on Lisinopril & reminded to elim  sodium etc;  We reviewed diet, Simva40, & low carb restrictions..    We reviewed prob list, meds, xrays and labs> see below for updates >>           Problem List:   ALLERGIC RHINITIS (ICD-477.9) - uses OTC meds Prn...  Hx of SINUSITIS (ICD-473.9)  ? of DYSPNEA (ICD-786.05) - see above... symptoms resolved w/ Alprazolam Rx... He is encouraged to incr exercise program. ~  Baseline CXR w/ scarring LLL, DJD TSpine, otherw WNL.[name]. last film 12/09  ~  PFT's 12/09= WNL.Marland KitchenMarland Kitchen FVC=6.14 (126%), FEV1= 4.94 (150%), %1sec= 80, mid-flows= 174%... ~  CXR 1/12 showed clear lungs, NAD.[name]. ~  CXR 1/13 showed normal heart size, clear lungs, DJD TSpine, NAD...  HYPERTENSION (ICD-401.9) - on ASA 81mg /d, & LISINOPRIL 20mg /d...  ~  1/12: BP = 132/82 & similar at home... he is asymptomatic- denies HA, fatigue, visual changes, CP, palipit, dizziness, syncope, dyspnea, edema, etc...  ~  1/13: BP= 140/90 & even better at home he says; reminded about diet, exercise & wt reduction strategy... ~  EKG 1/13 showed NSR, rate66, WNL/ NAD... ~  1/14:  BP= 140/90 & he denies CP, palpit, dizzy, SOB, edema...  HYPERCHOLESTEROLEMIA (ICD-272.0) - on SIMVASTATIN80- taking 1/2 tab Qhs... ~  FLP 9/08 showed TChol 133, TG 95, HDL 33, LDL 81 ~  FLP 12/09 showed TChol 177, TG 153, HDL 44, LDL 102... encouraged to take Simva daily. ~  FLP 1/11 showed TChol 152, TG 137, HDL 43, LDL 82 ~  FLP 1/12 on Simva40 showed TChol 147, TG 130, HDL 37, LDL 85 ~  FLP 1/13 on Simva40 showed TChol 161, TG 152, HDL 45, LDL 86... Needs better diet, same med, get wt down. 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 ~  labs throught the decade of the 2000's showed FBS from 109-121 ~  labs 12/09 showed FBS= 127... rec to avoid sweets and lose weight. ~  labs 1/11 showed FBS= 108, A1c= 6.1 ~  labs 1/12 showed BS=  111, A1c= 6.3 ~  Labs 1/13 showed BS= 124, A1c= 6.4... Still ok on diet alone, but needs to get wt down. 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 he required dilatation in 1988 & 1992...  ~  last EGD 1995 showed large HH, GERD w/ ulcerative inflamm, Bx for HPylori= neg. ~  last colonoscopy 11/97 was negative- he is currently overdue for screening colon f/u... ~  1/13:  We will refer chart to GI for f/u colonoscopy...  Hx of PEYRONIE'S DISEASE [phone]) & ED - uses Viagra Prn... he had full eval and Rx from DrEvans in 1999  ORTHO >> BACK PAIN, LUMBAR (ICD-724.2) ~  3/14: presented w/ question about right bicep after a fall> characteristic ruptured bicep tendon appearance w/ norm ROM, strength, etc...  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 ~  GU:  stable w/ PSA= 0.58 ~  Immunizations:  He declines the seasonal Flu vaccine;  TDAP given 1/13   Past Surgical History  Procedure Laterality Date  . Cholecystectomy  02/1993    Dr. Gerrit Friends  . Right hernia repair  2002    Dr. Orson Slick  . Right cataract surgery  11/2009    Dr. Emmit Pomfret    Outpatient Encounter Prescriptions as of 03/20/2013  Medication Sig Dispense Refill  . aspirin 81 MG tablet Take 81 mg by mouth daily.       [name] lisinopril (PRINIVIL,ZESTRIL) 20 MG tablet Take 1 tablet (20 mg total) by mouth daily.  90 tablet  3  . omeprazole (PRILOSEC) 20 MG capsule Take 1 capsule (20 mg total) by mouth daily.  90 capsule  3  . simvastatin (ZOCOR) 40 MG tablet Take 1 tablet (40 mg total) by mouth at bedtime.  90 tablet  3   No facility-administered encounter medications on file as of 03/20/2013.    Allergies  Allergen Reactions  . Penicillins     REACTION: rash and itching    Current Medications, Allergies, Past Medical History, Past Surgical  History, Family History, and Social History were reviewed in [employer] record.    Review of Systems        See HPI - all other systems neg except as noted...  The patient denies anorexia, fever, weight loss, weight gain, vision loss, decreased hearing, hoarseness, chest pain, syncope, dyspnea on exertion, peripheral edema, prolonged cough, headaches, hemoptysis,  abdominal pain, melena, hematochezia, severe indigestion/heartburn, hematuria, incontinence, muscle weakness, suspicious skin lesions, transient blindness, difficulty walking, depression, unusual weight change, abnormal bleeding, enlarged lymph nodes, and angioedema.     Objective:   Physical Exam     WD, WN, 72 y/o WM in NAD... GENERAL:  Alert & oriented; pleasant & cooperative... HEENT:  [place]/AT, EOM-wnl, PERRLA, EACs-clear, TMs-wnl, NOSE-clear, THROAT-clear & wnl. NECK:  Supple w/ fairROM; no JVD; normal carotid impulses w/o bruits; no thyromegaly or nodules palpated; no lymphadenopathy. CHEST:  Clear to P & A; without wheezes/ rales/ or rhonchi heard... HEART:  Regular Rhythm; without murmurs/ rubs/ or gallops detected... ABDOMEN:  Soft & nontender; normal bowel sounds; no organomegaly or masses palpated... EXT: without deformities or arthritic changes; no varicose veins/ venous insuffic/ or edema. NEURO:  CN's intact; motor testing normal; sensory testing normal; gait normal & balance OK. DERM:  No lesions noted; no rash etc...  RADIOLOGY DATA:  Reviewed in the EPIC EMR & discussed w/ the patient...    >>CXR 1/13 showed normal heart size, clear lungs, DJD TSpine, NAD...    >>EKG 1/13 showed  NSR, rate66, WNL/ NAD...  LABORATORY DATA:  Reviewed in the EPIC EMR & discussed w/ the patient...   Assessment:     Ruptured tendon on right bicep muscle- rec observation... Dermatitis on arm- combination dry skin & ns dermatitis- rec Lidex-E cream prn...   Hx of Dyspnea>  Prev responded to [facility]; now that  he is retired I have reiterated the rec for incr exercise program...  HBP>  Controlled on ACE, tol well, continue same/ no salt/ get wt down...  CHOL>  Controlled on diet & Simva40;  Continue same...  DM>  Controlled on diet alone; we reviewed diet & healthy choices...  HH & GERD>  Stable on Omep, no dysphagia reported; he is overdue for colonoscopy & we will refer to GI...  Hx Peyronies>  Aware, he requested Viagra for ED...  LBP>  Aware, he plays golf & we reviewed back/ ab strengthening exercises...  Other medical issues as noted...     Plan:     Patient's Medications  New Prescriptions   FLUOCINONIDE-EMOLLIENT (LIDEX-E) 0.05 % CREAM    Apply topically as needed.  Previous Medications   ASPIRIN 81 MG TABLET    Take 81 mg by mouth daily.    LISINOPRIL (PRINIVIL,ZESTRIL) 20 MG TABLET    Take 1 tablet (20 mg total) by mouth daily.   OMEPRAZOLE (PRILOSEC) 20 MG CAPSULE    Take 1 capsule (20 mg total) by mouth daily.   SIMVASTATIN (ZOCOR) 40 MG TABLET    Take 1 tablet (40 mg total) by mouth at bedtime.  Modified Medications   No medications on file  Discontinued Medications   No medications on file

## 2013-03-20 NOTE — Patient Instructions (Addendum)
Today we updated your med list in our EPIC system...    Continue your current medications the same...  We wrote a new prescription for generic Lidex-E cream to use as needed on your rash...  Call for any questions.Marland KitchenMarland Kitchen

## 2013-06-12 ENCOUNTER — Encounter: Payer: Self-pay | Admitting: Internal Medicine

## 2013-06-12 ENCOUNTER — Other Ambulatory Visit (INDEPENDENT_AMBULATORY_CARE_PROVIDER_SITE_OTHER): Payer: Medicare Other

## 2013-06-12 ENCOUNTER — Ambulatory Visit (INDEPENDENT_AMBULATORY_CARE_PROVIDER_SITE_OTHER): Payer: Medicare Other | Admitting: Internal Medicine

## 2013-06-12 ENCOUNTER — Ambulatory Visit (INDEPENDENT_AMBULATORY_CARE_PROVIDER_SITE_OTHER)
Admission: RE | Admit: 2013-06-12 | Discharge: 2013-06-12 | Disposition: A | Discharge status: Home / Self Care | Payer: Medicare Other | Source: Ambulatory Visit | Attending: Internal Medicine | Admitting: Internal Medicine

## 2013-06-12 VITALS — BP 124/84 | HR 70 | Temp 98.0°F | Ht 72.0 in | Wt 239.0 lb

## 2013-06-12 DIAGNOSIS — R079 Chest pain, unspecified: Secondary | ICD-10-CM

## 2013-06-12 DIAGNOSIS — R0600 Dyspnea, unspecified: Secondary | ICD-10-CM

## 2013-06-12 DIAGNOSIS — I10 Essential (primary) hypertension: Secondary | ICD-10-CM

## 2013-06-12 DIAGNOSIS — R0989 Other specified symptoms and signs involving the circulatory and respiratory systems: Secondary | ICD-10-CM

## 2013-06-12 DIAGNOSIS — R0609 Other forms of dyspnea: Secondary | ICD-10-CM

## 2013-06-12 LAB — BASIC METABOLIC PANEL
CO2: 28 mEq/L (ref 19–32)
Chloride: 103 mEq/L (ref 96–112)
Glucose, Bld: 121 mg/dL — ABNORMAL HIGH (ref 70–99)
Sodium: 141 mEq/L (ref 135–145)

## 2013-06-12 MED ORDER — NEBIVOLOL HCL 10 MG PO TABS: 10.0000 mg | ORAL_TABLET | Freq: Every day | ORAL | Status: DC

## 2013-06-12 NOTE — Patient Instructions (Addendum)
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  Pain should improve w/in 2 weeks and if not then consider further GI work up.     Stop prinivil/zestoril  Bystolic 10 mg one daily will replace the prinivil and should improve your breathing  Please remember to go to the lab and x-ray department downstairs for your tests - we will call you with the results when they are available.     See Kriste Basque or Tammy NP in 2 weeks - call sooner if condition worsens in any way

## 2013-06-12 NOTE — Progress Notes (Addendum)
Subjective:     Patient ID: Howard Fisher, male   DOB: 12/18/1941    MRN: [id number]   ~  December 30, 2010:  Yearly ROV & review- feeling well, no new complaints or concerns, just same mild urinary symptoms w/ freq/ hesitancy but not bad enough for GU eval he says... 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  He declines the flu vaccine but will accept the TDAP today;  See prob list below>>  ~  January 02, 2013:  1 year ROV & Nathanyl continues to do well- no new complaints or concerns; BP well controlled on Lisinopril 20;  FLP remains at goals on Simva40;  Hx HH/ GERD on Prilosec20 & he denies abd pain, n/v, c/d, blood seen- he continues to f/u decline colonoscopy "I hate the prep", we reviewed alternative preps & he'll think about it...    We reviewed prob list, meds, xrays and labs> see below for updates >> he continues to decline the Flu vaccine... LABS 1/14:  FLP- at goals on Simva40;  Chems- ok x BS=134, A1c=6.6 on diet;  CBC- wnl;  TSH=2.79;  PSA=0.75   ~  March 20, 2013:  22mo ROV & add-on at pt rquest for 2 problems:  1) mild rash on right upper arm that looks like a dermatitis- no shingles- mildly pruritic, no adenopathy, etc; we discussed Rx w/ Lidex-E cream & call if not resolved...  2) about 38yr ago he fell (slid on buttocks) & braced his fall w/ both arms behind him- had some bilat shoulder pain that resolved over time but noted a change in his bicep muscles- characteristic a ruptured bicep tendon; he has excellent strength in arms, norm ROM, etc; he is reassured & nothing further needs to be done (offered Ortho eval but he declines)...     He has put on a few lbs buty looking forward to incr exercise this spring;  BP controlled on Lisinopril & reminded to elim sodium etc;  We reviewed diet, Simva40, & low carb restrictions..    We reviewed prob list, meds, xrays and  labs> see below for updates >>  rec No change rx   06/12/2013 acute w/in ov/Kirt Chew re new sob/cp Chief Complaint  Patient presents with  . Acute Visit    Pt c/o left side CP x 1 wk- pain is sharp and comes and goes about every 30 seconds. He also c/o lack of energy and DOE for the past 3-4 months.   doe x one flight of steps if difficulty, varies with no pattern in terms of condition or time of day New pattern Cp lasts 10 sec then may come back 30 secs later again for a few secs only  5-6 x per day,  never with exertion, no cough. Always just above L nipple s radiation or pain with L shoulder  Motion or relation to meals, flatus or belching, nausea or diaphoresis.  Sleeping ok without nocturnal  or early am exacerbation  of respiratory  c/o's or need for noct saba. Also denies any obvious fluctuation of symptoms with weather or environmental changes or other aggravating or alleviating factors except as outlined above   Current Medications, Allergies, Past Medical History, Past Surgical History, Family History, and Social History were reviewed in [employer] record.  ROS  The following are not active  complaints unless bolded sore throat, dysphagia, dental problems, itching, sneezing,  nasal congestion or excess/ purulent secretions, ear ache,   fever, chills, sweats, unintended wt loss, pleuritic or exertional cp, hemoptysis,  orthopnea pnd or leg swelling, presyncope, palpitations, heartburn, abdominal pain, anorexia, nausea, vomiting, diarrhea  or change in bowel or urinary habits, change in stools or urine, dysuria,hematuria,  rash, arthralgias, visual complaints, headache, numbness weakness or ataxia or problems with walking or coordination,  change in mood/affect or memory.                   Problem List:   ALLERGIC RHINITIS (ICD-477.9) - uses OTC meds Prn...  Hx of SINUSITIS (ICD-473.9)  ? of DYSPNEA (ICD-786.05) - see above... symptoms resolved w/ Alprazolam  Rx... He is encouraged to incr exercise program. ~  Baseline CXR w/ scarring LLL, DJD TSpine, otherw WNL.[name]. last film 12/09  ~  PFT's 12/09= WNL.Marland KitchenMarland Kitchen FVC=6.14 (126%), FEV1= 4.94 (150%), %1sec= 80, mid-flows= 174%... ~  CXR 1/12 showed clear lungs, NAD.[name]. ~  CXR 1/13 showed normal heart size, clear lungs, DJD TSpine, NAD...  HYPERTENSION (ICD-401.9) - on ASA 81mg /d, & LISINOPRIL 20mg /d...  ~  1/12: BP = 132/82 & similar at home... he is asymptomatic- denies HA, fatigue, visual changes, CP, palipit, dizziness, syncope, dyspnea, edema, etc...  ~  1/13: BP= 140/90 & even better at home he says; reminded about diet, exercise & wt reduction strategy... ~  EKG 1/13 showed NSR, rate66, WNL/ NAD... ~  1/14:  BP= 140/90 & he denies CP, palpit, dizzy, SOB, edema...  HYPERCHOLESTEROLEMIA (ICD-272.0) - on SIMVASTATIN80- taking 1/2 tab Qhs... ~  FLP 9/08 showed TChol 133, TG 95, HDL 33, LDL 81 ~  FLP 12/09 showed TChol 177, TG 153, HDL 44, LDL 102... encouraged to take Simva daily. ~  FLP 1/11 showed TChol 152, TG 137, HDL 43, LDL 82 ~  FLP 1/12 on Simva40 showed TChol 147, TG 130, HDL 37, LDL 85 ~  FLP 1/13 on Simva40 showed TChol 161, TG 152, HDL 45, LDL 86... Needs better diet, same med, get wt down. 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 ~  labs throught the decade of the 2000's showed FBS from 109-121 ~  labs 12/09 showed FBS= 127... rec to avoid sweets and lose weight. ~  labs 1/11 showed FBS= 108, A1c= 6.1 ~  labs 1/12 showed BS= 111, A1c= 6.3 ~  Labs 1/13 showed BS= 124, A1c= 6.4... Still ok on diet alone, but needs to get wt down. 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 he required dilatation in 1988 & 1992...  ~  last EGD 1995 showed large HH, GERD w/ ulcerative inflamm, Bx  for HPylori= neg. ~  last colonoscopy 11/97 was negative- he is currently overdue for screening colon f/u... ~  1/13:  We will refer chart to GI for f/u colonoscopy...  Hx of PEYRONIE'S DISEASE [phone]) & ED - uses Viagra Prn... he had full eval and Rx from DrEvans in 1999  ORTHO >> BACK PAIN, LUMBAR (ICD-724.2) ~  3/14: presented w/ question about right bicep after a fall> characteristic ruptured bicep tendon appearance w/ norm ROM, strength, etc...  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 ~  GU:  stable w/ PSA= 0.58 ~  Immunizations:  He declines the seasonal Flu vaccine;  TDAP given 1/13   Past Surgical History  Procedure Laterality Date  . Cholecystectomy  02/1993    Dr. Gerrit Friends  . Right hernia repair  2002    Dr. Orson Slick  . Right cataract surgery  11/2009    Dr. Emmit Pomfret     Allergies  Allergen Reactions  . Penicillins     REACTION: rash and itching       Objective:   Physical Exam  Elderly  amb  WM in NAD... GENERAL:  Alert & oriented; pleasant & cooperative... HEENT:  [place]/AT, EOM-wnl, PERRLA, EACs-clear, TMs-wnl, NOSE-clear, THROAT-clear & wnl. NECK:  Supple w/ fairROM; no JVD; normal carotid impulses w/o bruits; no thyromegaly or nodules palpated; no lymphadenopathy. CHEST:  Clear to P & A; without wheezes/ rales/ or rhonchi heard... HEART:  Regular Rhythm; without murmurs/ rubs/ or gallops detected... ABDOMEN:  Soft & nontender; normal bowel sounds; no organomegaly or masses palpated... EXT: without deformities or arthritic changes; no varicose veins/ venous insuffic/ or edema. NEURO:  CN's intact; motor testing normal; sensory testing normal; gait normal & balance OK. DERM:  No lesions noted; no rash     CXR  06/12/2013 :  No active disease. No significant change.  ekg 06/12/13 wnl   Assessment:

## 2013-06-12 NOTE — Assessment & Plan Note (Addendum)
ACE inhibitors are problematic in  pts with respiratory complaints because  even experienced pulmonologists can't always distinguish ace effects from copd/asthma.  By themselves they don't actually cause a problem, much like oxygen can't by itself start a fire, but they certainly serve as a powerful catalyst or enhancer for any "fire"  or inflammatory process in the upper airway, be it caused by an ET  tube or more commonly reflux (especially in the obese or pts with known GERD as his case)  Changed to bystolic 10 mg with f/u w/in 2 weeks recommended

## 2013-06-12 NOTE — Assessment & Plan Note (Addendum)
06/12/2013  Walked RA x 3 laps @ 185 ft each stopped due to end of study, no sob, no cp, no desat  Symptoms are markedly disproportionate to objective findings and not clear this is a lung problem but pt does appear to have difficult airway management issues. DDX of  difficult airways managment all start with A and  include Adherence, Ace Inhibitors, Acid Reflux, Active Sinus Disease, Alpha 1 Antitripsin deficiency, Anxiety masquerading as Airways dz,  ABPA,  allergy(esp in young), Aspiration (esp in elderly), Adverse effects of DPI,  Active smokers, plus two Bs  = Bronchiectasis and Beta blocker use..and one C= CHF  ? Anxiety > dx of exclusion  ? acei related > try off short term to see what difference if any this makes  ? Acid reflux > reviewed rx, diet.   See instructions for specific recommendations which were reviewed directly with the patient who was given a copy with highlighter outlining the key components.

## 2013-06-13 LAB — CBC WITH DIFFERENTIAL/PLATELET
Basophils Absolute: 0 10*3/uL (ref 0.0–0.1)
Eosinophils Absolute: 0.2 10*3/uL (ref 0.0–0.7)
Hemoglobin: 16.9 g/dL (ref 13.0–17.0)
Lymphocytes Relative: 32.7 % (ref 12.0–46.0)
Lymphs Abs: 2.6 10*3/uL (ref 0.7–4.0)
MCHC: 34.1 g/dL (ref 30.0–36.0)
MCV: 97.5 fl (ref 78.0–100.0)
Monocytes Absolute: 0.9 10*3/uL (ref 0.1–1.0)
Neutro Abs: 4.2 10*3/uL (ref 1.4–7.7)
RDW: 13.4 % (ref 11.5–14.6)

## 2013-06-13 NOTE — Progress Notes (Signed)
Quick Note:  Spoke with pt and notified of results per Dr. Wert. Pt verbalized understanding and denied any questions.  ______ 

## 2013-06-14 NOTE — Assessment & Plan Note (Signed)
veyr atypical and only lasts 10 secs in sitting position never with exertion so likely this is some form of muslce spasm or ibs / splenic flex syndrome.  rec trial of citrucel, reassurance > f/u in 2 weeks

## 2013-06-17 NOTE — Progress Notes (Signed)
Quick Note:  Pt aware ______ 

## 2013-07-02 ENCOUNTER — Encounter: Payer: Self-pay | Admitting: Adult Health

## 2013-07-02 ENCOUNTER — Ambulatory Visit (INDEPENDENT_AMBULATORY_CARE_PROVIDER_SITE_OTHER): Payer: Medicare Other | Admitting: Adult Health

## 2013-07-02 VITALS — BP 130/74 | HR 63 | Temp 98.8°F | Ht 72.0 in | Wt 239.0 lb

## 2013-07-02 DIAGNOSIS — R079 Chest pain, unspecified: Secondary | ICD-10-CM

## 2013-07-02 DIAGNOSIS — I10 Essential (primary) hypertension: Secondary | ICD-10-CM

## 2013-07-02 MED ORDER — BISOPROLOL FUMARATE 10 MG PO TABS: 10.0000 mg | ORAL_TABLET | Freq: Every day | ORAL | Status: DC

## 2013-07-02 NOTE — Assessment & Plan Note (Signed)
Controlled on current regimen.   

## 2013-07-02 NOTE — Progress Notes (Signed)
Subjective:     Patient ID: Howard Fisher, male   DOB: January 29, 1941    MRN: [id number]   ~  December 30, 2010:  Yearly ROV & review- feeling well, no new complaints or concerns, just same mild urinary symptoms w/ freq/ hesitancy but not bad enough for GU eval he says... 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  He declines the flu vaccine but will accept the TDAP today;  See prob list below>>  ~  January 02, 2013:  1 year ROV & Howard Fisher continues to do well- no new complaints or concerns; BP well controlled on Lisinopril 20;  FLP remains at goals on Simva40;  Hx HH/ GERD on Prilosec20 & he denies abd pain, n/v, c/d, blood seen- he continues to f/u decline colonoscopy "I hate the prep", we reviewed alternative preps & he'll think about it...    We reviewed prob list, meds, xrays and labs> see below for updates >> he continues to decline the Flu vaccine... LABS 1/14:  FLP- at goals on Simva40;  Chems- ok x BS=134, A1c=6.6 on diet;  CBC- wnl;  TSH=2.79;  PSA=0.75   ~  March 20, 2013:  45mo ROV & add-on at pt rquest for 2 problems:  1) mild rash on right upper arm that looks like a dermatitis- no shingles- mildly pruritic, no adenopathy, etc; we discussed Rx w/ Lidex-E cream & call if not resolved...  2) about 55yr ago he fell (slid on buttocks) & braced his fall w/ both arms behind him- had some bilat shoulder pain that resolved over time but noted a change in his bicep muscles- characteristic a ruptured bicep tendon; he has excellent strength in arms, norm ROM, etc; he is reassured & nothing further needs to be done (offered Ortho eval but he declines)...     He has put on a few lbs buty looking forward to incr exercise this spring;  BP controlled on Lisinopril & reminded to elim sodium etc;  We reviewed diet, Simva40, & low carb restrictions..    We reviewed prob list, meds, xrays and  labs> see below for updates >>  rec No change rx   06/12/2013 acute w/in ov/Wert re new sob/cp Chief Complaint  Patient presents with  . Acute Visit    Pt c/o left side CP x 1 wk- pain is sharp and comes and goes about every 30 seconds. He also c/o lack of energy and DOE for the past 3-4 months.   doe x one flight of steps if difficulty, varies with no pattern in terms of condition or time of day New pattern Cp lasts 10 sec then may come back 30 secs later again for a few secs only  5-6 x per day,  never with exertion, no cough. Always just above L nipple s radiation or pain with L shoulder  Motion or relation to meals, flatus or belching, nausea or diaphoresis.  >>stop ACE , rx bystolic   07/02/2013 Follow up  Returns for 3 week follow up . Seen last ov with left sided chest pain, EKG , CXR and labs were all unremarkable.  He was changed off his ACE inhibitor to Bystolic and advised on dietary change to help control abd gas.  Returns today feeling better.   reports symptoms are completely resolved.  notes doing well on the bystolic,  would like rx for the generic.  no new complaints. Denies chest pain, dyspnea or edema.  Current Medications, Allergies, Past Medical History, Past Surgical History, Family History, and Social History were reviewed in [employer] record.  ROS  The following are not active complaints unless bolded sore throat, dysphagia, dental problems, itching, sneezing,  nasal congestion or excess/ purulent secretions, ear ache,   fever, chills, sweats, unintended wt loss, pleuritic or exertional cp, hemoptysis,  orthopnea pnd or leg swelling, presyncope, palpitations, heartburn, abdominal pain, anorexia, nausea, vomiting, diarrhea  or change in bowel or urinary habits, change in stools or urine, dysuria,hematuria,  rash, arthralgias, visual complaints, headache, numbness weakness or ataxia or problems with walking or coordination,  change in mood/affect or  memory.                   Problem List:   ALLERGIC RHINITIS (ICD-477.9) - uses OTC meds Prn...  Hx of SINUSITIS (ICD-473.9)  ? of DYSPNEA (ICD-786.05) - see above... symptoms resolved w/ Alprazolam Rx... He is encouraged to incr exercise program. ~  Baseline CXR w/ scarring LLL, DJD TSpine, otherw WNL.[name]. last film 12/09  ~  PFT's 12/09= WNL.Marland KitchenMarland Kitchen FVC=6.14 (126%), FEV1= 4.94 (150%), %1sec= 80, mid-flows= 174%... ~  CXR 1/12 showed clear lungs, NAD.[name]. ~  CXR 1/13 showed normal heart size, clear lungs, DJD TSpine, NAD...  HYPERTENSION (ICD-401.9) - on ASA 81mg /d, & LISINOPRIL 20mg /d...  ~  1/12: BP = 132/82 & similar at home... he is asymptomatic- denies HA, fatigue, visual changes, CP, palipit, dizziness, syncope, dyspnea, edema, etc...  ~  1/13: BP= 140/90 & even better at home he says; reminded about diet, exercise & wt reduction strategy... ~  EKG 1/13 showed NSR, rate66, WNL/ NAD... ~  1/14:  BP= 140/90 & he denies CP, palpit, dizzy, SOB, edema...  HYPERCHOLESTEROLEMIA (ICD-272.0) - on SIMVASTATIN80- taking 1/2 tab Qhs... ~  FLP 9/08 showed TChol 133, TG 95, HDL 33, LDL 81 ~  FLP 12/09 showed TChol 177, TG 153, HDL 44, LDL 102... encouraged to take Simva daily. ~  FLP 1/11 showed TChol 152, TG 137, HDL 43, LDL 82 ~  FLP 1/12 on Simva40 showed TChol 147, TG 130, HDL 37, LDL 85 ~  FLP 1/13 on Simva40 showed TChol 161, TG 152, HDL 45, LDL 86... Needs better diet, same med, get wt down. 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 ~  labs throught the decade of the 2000's showed FBS from 109-121 ~  labs 12/09 showed FBS= 127... rec to avoid sweets and lose weight. ~  labs 1/11 showed FBS= 108, A1c= 6.1 ~  labs 1/12 showed BS= 111, A1c= 6.3 ~  Labs 1/13 showed BS= 124, A1c= 6.4... Still ok on diet alone, but needs to get wt down. 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 he required dilatation in 1988 & 1992...  ~  last EGD 1995 showed large HH, GERD w/ ulcerative inflamm, Bx for HPylori= neg. ~  last colonoscopy 11/97 was negative- he is currently overdue for screening colon f/u... ~  1/13:  We will refer chart to GI for f/u colonoscopy...  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 he had full eval and Rx from DrEvans  in 1999  ORTHO >> BACK PAIN, LUMBAR (ICD-724.2) ~  3/14: presented w/ question about right bicep after a fall> characteristic ruptured bicep tendon appearance w/ norm ROM, strength, etc...  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 ~  GU:  stable w/ PSA= 0.58 ~  Immunizations:  He declines the seasonal Flu vaccine;  TDAP given 1/13   Past Surgical History  Procedure Laterality Date  . Cholecystectomy  02/1993    Dr. Gerrit Friends  . Right hernia repair  2002    Dr. Orson Slick  . Right cataract surgery  11/2009    Dr. Emmit Pomfret     Allergies  Allergen Reactions  . Penicillins     REACTION: rash and itching       Objective:   Physical Exam  Elderly  amb  WM in NAD... GENERAL:  Alert & oriented; pleasant & cooperative... HEENT:  [place]/AT, EOM-wnl, PERRLA, EACs-clear, TMs-wnl, NOSE-clear, THROAT-clear & wnl. NECK:  Supple w/ fairROM; no JVD; normal carotid impulses w/o bruits; no thyromegaly or nodules palpated; no lymphadenopathy. CHEST:  Clear to P & A; without wheezes/ rales/ or rhonchi heard... HEART:  Regular Rhythm; without murmurs/ rubs/ or gallops detected... ABDOMEN:  Soft & nontender; normal bowel sounds; no organomegaly or masses palpated... EXT: without deformities or arthritic changes; no varicose veins/ venous insuffic/ or edema. NEURO:  CN's intact; motor testing normal; sensory  testing normal; gait normal & balance OK. DERM:  No lesions noted; no rash     CXR  06/12/2013 :  No active disease. No significant change.  ekg 06/12/13 wnl   Assessment:

## 2013-07-02 NOTE — Patient Instructions (Addendum)
Keep up great job.  Will replace Bystolic 10mg  with Bisoprolol 10mg  daily  Low salt diet .  follow up Dr. Kriste Basque  In 6 months and As needed

## 2013-07-02 NOTE — Assessment & Plan Note (Signed)
Resolved , labs, xray and ekg were unremarkable last ov  Doing well on current regimen .  Advised to follow up in 6 months with Dr. Kriste Basque  As planned and As needed

## 2013-07-11 ENCOUNTER — Telehealth: Payer: Self-pay | Admitting: Pulmonary Disease

## 2013-07-11 MED ORDER — OMEPRAZOLE 20 MG PO CPDR: 20.0000 mg | DELAYED_RELEASE_CAPSULE | Freq: Every day | ORAL | Status: DC

## 2013-07-11 MED ORDER — ATENOLOL 50 MG PO TABS: 50.0000 mg | ORAL_TABLET | Freq: Every day | ORAL | Status: DC

## 2013-07-11 NOTE — Telephone Encounter (Signed)
Per SN---  bystolic is the best.  There is no generic for this medication If he insists then we can change to atenolol 50 mg  1 daily.  thanks

## 2013-07-11 NOTE — Telephone Encounter (Signed)
Pt states he has to have generic and wants atenolol sent. Rx sent. Pt also asks for refill on omeprazole. Carron Curie, CMA

## 2013-07-11 NOTE — Telephone Encounter (Signed)
Called and spoke with pt and he stated that the bystolic is too expensive.  Pt is requesting  A generic medication and that this be sent in as soon as we can and for a 90 day supply.  SN please advise. Thanks  Allergies  Allergen Reactions  . Penicillins     REACTION: rash and itching

## 2013-07-18 ENCOUNTER — Telehealth: Payer: Self-pay | Admitting: Pulmonary Disease

## 2013-07-18 NOTE — Telephone Encounter (Signed)
Received form from [facility]---they wanted to make sure the pt is to be on the atenolol 50 day and the zebeta 10 mg  Daily.  According to the last phone note the pt stated that the zebata was too expensive and he could not afford this medication and SN was ok to change to atenolol 50 mg daily.  SN if this is ok will you please sign the attached order from [facility].  Thanks   Allergies  Allergen Reactions  . Penicillins     REACTION: rash and itching

## 2013-07-23 NOTE — Telephone Encounter (Signed)
This has been faxed back to [facility].  Nothing further is needed.

## 2013-07-28 ENCOUNTER — Telehealth: Payer: Self-pay | Admitting: Pulmonary Disease

## 2013-07-28 NOTE — Telephone Encounter (Signed)
06/12/13 Patient Instructions    Splenic flexure syndrome: Treatment consists of avoiding foods that cause gas (especially beans and raw vegetables like spinach and salads) and citrucel 1 heaping tsp twice daily with a large glass of water. Pain should improve w/in 2 weeks and if not then consider further GI work up.  Stop prinivil/zestoril  Bystolic 10 mg one daily will replace the prinivil and should improve your breathing  Please remember to go to the lab and x-ray department downstairs for your tests - we will call you with the results when they are available.  See Kriste Basque or Tammy NP in 2 weeks - call sooner if condition worsens in any way   07/02/13 Patient Instructions    Keep up great job.  Will replace Bystolic 10mg  with Bisoprolol 10mg  daily  Low salt diet .  follow up Dr. Kriste Basque In 6 months and As needed    07/11/13 telephone note: patient states Bystolic is too expensive:  Marcellus Scott, CMA at 07/11/2013 12:50 PM    Status: Signed             Per SN---  bystolic is the best. There is no generic for this medication  If he insists then we can change to atenolol 50 mg 1 daily. thanks   07/18/13-- telephone note Marcellus Scott, CMA at 07/23/2013 9:50 AM   Status: Signed            This has been faxed back to [facility]. Nothing further is needed.        Marcellus Scott, CMA at 07/18/2013 11:59 AM    Status: Signed             Received form from [facility]---they wanted to make sure the pt is to be on the atenolol 50 day and the zebeta 10 mg Daily. According to the last phone note the pt stated that the zebata was too expensive and he could not afford this medication and SN was ok to change to atenolol 50 mg daily. SN if this is ok will you please sign the attached order from [facility]. Thanks   Spoke with patients spouse, she and patient are unclear if patient is to be on atenolol and bisoprolol or just one or the other Please advise Dr. Kriste Basque, thank you!

## 2013-07-29 NOTE — Telephone Encounter (Signed)
I spoke with the pt and advised that according to last OV note with TP the pt bystolic was changed to bisoprolol because he requested a generic. Then on 07-11-13 it shows that the pt called back and stated again that the bystolic was too expensive and that he wanted a generic so then atenolol was called in. So the pt received both rxs and was confused as to what to take. I advised that according to TP note he was switched to the bisoprolol first so that is what he should stay with and to not take both meds. Pt states he will take the bisoprolol and cancel the atenolol rx at his pharmacy. Med list corrected. Carron Curie, CMA

## 2013-07-29 NOTE — Telephone Encounter (Signed)
From the last phone note the pt and his wife stated that the bisoprolol was too expensive, so they requested that we send in something cheaper and SN recs was for the atenolol 50 mg  Daily.    This was the medication that was sent in.

## 2013-10-16 ENCOUNTER — Telehealth: Payer: Self-pay | Admitting: Pulmonary Disease

## 2013-10-16 NOTE — Telephone Encounter (Signed)
I spoke with pt. He reports [facility] is stating he does not have any refills left on his bisoprolol. I advised we sent this in July #90 x 3 refills. I advised him will call them and get this cleared up. I spoke with [facility] and they will get pt RX shipped out to him. It was confusion on the pharmacy behalf. Nothing further needed

## 2013-11-24 HISTORY — PX: OTHER SURGICAL HISTORY: SHX169

## 2013-12-22 ENCOUNTER — Telehealth: Payer: Self-pay | Admitting: Pulmonary Disease

## 2013-12-22 NOTE — Telephone Encounter (Signed)
Per SN---  Ok to add pt on to SN schedule for 1/22  Problem with schedule since SN will be retiring.  Does the pt have someone that he would like to transition to for his primary care?  If so, we can send records.

## 2013-12-22 NOTE — Telephone Encounter (Signed)
Leigh, pls advise if pt can be worked in sometime in January for CPX.  Thank you.

## 2013-12-22 NOTE — Telephone Encounter (Signed)
Spoke with spouse. Appt scheduled. She will look to transition his care

## 2013-12-25 DIAGNOSIS — C4491 Basal cell carcinoma of skin, unspecified: Secondary | ICD-10-CM

## 2013-12-25 HISTORY — DX: Basal cell carcinoma of skin, unspecified: C44.91

## 2014-01-05 ENCOUNTER — Ambulatory Visit: Payer: Medicare Other | Admitting: Pulmonary Disease

## 2014-01-15 ENCOUNTER — Other Ambulatory Visit (INDEPENDENT_AMBULATORY_CARE_PROVIDER_SITE_OTHER): Payer: Medicare Other

## 2014-01-15 ENCOUNTER — Encounter: Payer: Self-pay | Admitting: Pulmonary Disease

## 2014-01-15 ENCOUNTER — Ambulatory Visit (INDEPENDENT_AMBULATORY_CARE_PROVIDER_SITE_OTHER): Payer: Medicare Other | Admitting: Pulmonary Disease

## 2014-01-15 VITALS — BP 140/90 | HR 68 | Temp 97.7°F | Ht 73.0 in | Wt 247.0 lb

## 2014-01-15 DIAGNOSIS — R7309 Other abnormal glucose: Secondary | ICD-10-CM

## 2014-01-15 DIAGNOSIS — K449 Diaphragmatic hernia without obstruction or gangrene: Secondary | ICD-10-CM

## 2014-01-15 DIAGNOSIS — E7800 Pure hypercholesterolemia, unspecified: Secondary | ICD-10-CM

## 2014-01-15 DIAGNOSIS — I10 Essential (primary) hypertension: Secondary | ICD-10-CM

## 2014-01-15 DIAGNOSIS — E663 Overweight: Secondary | ICD-10-CM

## 2014-01-15 DIAGNOSIS — N320 Bladder-neck obstruction: Secondary | ICD-10-CM

## 2014-01-15 DIAGNOSIS — M545 Low back pain, unspecified: Secondary | ICD-10-CM

## 2014-01-15 DIAGNOSIS — N4889 Other specified disorders of penis: Secondary | ICD-10-CM

## 2014-01-15 DIAGNOSIS — K219 Gastro-esophageal reflux disease without esophagitis: Secondary | ICD-10-CM

## 2014-01-15 LAB — BASIC METABOLIC PANEL
BUN: 16 mg/dL (ref 6–23)
CALCIUM: 9.5 mg/dL (ref 8.4–10.5)
CO2: 29 mEq/L (ref 19–32)
Chloride: 103 mEq/L (ref 96–112)
Creatinine, Ser: 1.1 mg/dL (ref 0.4–1.5)
GFR: 68.16 mL/min (ref >60.00)
GLUCOSE: 155 mg/dL — AB (ref 70–99)
POTASSIUM: 4.3 meq/L (ref 3.5–5.1)
SODIUM: 139 meq/L (ref 135–145)

## 2014-01-15 LAB — HEPATIC FUNCTION PANEL
ALBUMIN: 4.2 g/dL (ref 3.5–5.2)
ALK PHOS: 59 U/L (ref 39–117)
ALT: 51 U/L (ref 0–53)
AST: 43 U/L — ABNORMAL HIGH (ref 0–37)
BILIRUBIN DIRECT: 0.1 mg/dL (ref 0.0–0.3)
BILIRUBIN TOTAL: 0.7 mg/dL (ref 0.3–1.2)
Total Protein: 7.2 g/dL (ref 6.0–8.3)

## 2014-01-15 LAB — CBC WITH DIFFERENTIAL/PLATELET
BASOS PCT: 0.4 % (ref 0.0–3.0)
Basophils Absolute: 0 10*3/uL (ref 0.0–0.1)
Eosinophils Absolute: 0.3 10*3/uL (ref 0.0–0.7)
Eosinophils Relative: 3 % (ref 0.0–5.0)
HCT: 48.1 % (ref 39.0–52.0)
HEMOGLOBIN: 16.5 g/dL (ref 13.0–17.0)
LYMPHS ABS: 3 10*3/uL (ref 0.7–4.0)
Lymphocytes Relative: 34.6 % (ref 12.0–46.0)
MCHC: 34.3 g/dL (ref 30.0–36.0)
MCV: 93.7 fl (ref 78.0–100.0)
MONOS PCT: 10.2 % (ref 3.0–12.0)
Monocytes Absolute: 0.9 10*3/uL (ref 0.1–1.0)
NEUTROS ABS: 4.5 10*3/uL (ref 1.4–7.7)
Neutrophils Relative %: 51.8 % (ref 43.0–77.0)
Platelets: 191 10*3/uL (ref 150.0–400.0)
RBC: 5.13 Mil/uL (ref 4.22–5.81)
RDW: 13.2 % (ref 11.5–14.6)
WBC: 8.6 10*3/uL (ref 4.5–10.5)

## 2014-01-15 LAB — LIPID PANEL
CHOL/HDL RATIO: 4
Cholesterol: 161 mg/dL (ref 0–200)
HDL: 40 mg/dL (ref >39.00)
Triglycerides: 255 mg/dL — ABNORMAL HIGH (ref 0.0–149.0)
VLDL: 51 mg/dL — AB (ref 0.0–40.0)

## 2014-01-15 LAB — PSA: PSA: 0.52 ng/mL (ref 0.10–4.00)

## 2014-01-15 LAB — TSH: TSH: 3.61 u[IU]/mL (ref 0.35–5.50)

## 2014-01-15 LAB — LDL CHOLESTEROL, DIRECT: Direct LDL: 94.6 mg/dL

## 2014-01-15 LAB — HEMOGLOBIN A1C: Hgb A1c MFr Bld: 7.1 % — ABNORMAL HIGH (ref 4.6–6.5)

## 2014-01-15 MED ORDER — BISOPROLOL FUMARATE 10 MG PO TABS: 10.0000 mg | ORAL_TABLET | Freq: Every day | ORAL | Status: DC

## 2014-01-15 MED ORDER — OMEPRAZOLE 20 MG PO CPDR: 20.0000 mg | DELAYED_RELEASE_CAPSULE | Freq: Every day | ORAL | Status: DC

## 2014-01-15 NOTE — Progress Notes (Signed)
Subjective:     Patient ID: Howard Fisher, male   DOB: 10/09/1941, 73 y.o.   MRN: [id number]  HPI 73 y/o WM here for a follow up visit... he has multiple medical problems as noted below...    ~  January 02, 2013:  1 year Howard Fisher continues to do well, retired from his trucking job- no new complaints or concerns; BP well controlled on Lisinopril20;  FLP remains at goals on Howard Fisher;  Hx HH/ GERD on Prilosec20 & he denies abd pain, n/v, c/d, blood seen- he continues to f/u decline colonoscopy "I hate the prep", we reviewed alternative preps & he'll think about it...    We reviewed prob list, meds, xrays and labs> see below for updates >> he continues to decline the Flu vaccine... LABS 1/14:  FLP- at goals on Simva40;  Chems- ok x BS=134, A1c=6.6 on diet;  CBC- wnl;  TSH=2.79;  PSA=0.75   ~  March 20, 2013:  59mo ROV & add-on at pt rquest for 2 problems:  1) mild rash on right upper arm that looks like a dermatitis- no shingles- mildly pruritic, no adenopathy, etc; we discussed Rx w/ Lidex-E cream & call if not resolved...  2) about 8yr ago he fell (slid on buttocks) & braced his fall w/ both arms behind him- had some bilat shoulder pain that resolved over time but noted a change in his bicep muscles- characteristic a ruptured bicep tendon; he has excellent strength in arms, norm ROM, etc; he is reassured & nothing further needs to be done (offered Ortho eval but he declines)...     He has put on a few lbs buty looking forward to incr exercise this spring;  BP controlled on Lisinopril & reminded to elim sodium etc;  We reviewed diet, Simva40, & low carb restrictions..    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     Hx Dyspnea> he is a never smoker, CXR is clear, dyspnea resolved w/ Alpraz Rx...    HBP> on ASA81 & Zebeta10; BP= 144/90 but lower on home checks and at [facility] recently; denies CP, palpit, SOB, dizzy, edema...     Chol> on Simva40 up to 247#; FLP 1/15 shows TChol 161, TG 255, HDL 40, LDL 95; needs better low fat diet & weight reduction...    Borderline DM> on diet alone but wt up as noted; Labs 1/15 showed BS= 155, A1c=7.1 and Rec to start METFORMIN ER 500mg Qam...    Overweight> wt is up 5# to 247# today; we reviewed low carb, low fat diet...    GI- HH, GERD, refuses colonoscopy> on Prilosec20; he is s/p esoph dilatations in 1988 & 1992 w/ large HH & esophagitis; colonoscopy [phone] was neg & he has refused to sched f/u...    GU- Hx peyronie's> he had a prev eval by Urology DrEvans; PSA remains wnl and 1/15 PSA=0.52...    Ortho- LBP, ruptured head of right biceps> aware, he does not require prescription pain meds... We reviewed prob list, meds, xrays and labs> see below for updates >> He refuses 2014 Flu vaccine; he refuses colonoscopy despite the risks.Howard KitchenMarland Fisher  LABS 1/15:  FLP- chol at goals on diet but TG=255;  Chems- ok x BS=155, A1c=7.1;  CBC- wnl;  TSH=3.61;  PSA=0.52...            Problem List:   ALLERGIC RHINITIS (ICD-477.9) - uses OTC meds Prn...  Hx of SINUSITIS (ICD-473.9)  ? of DYSPNEA (ICD-786.05) - see above... symptoms resolved w/ Alprazolam Rx... He is encouraged to incr exercise program. ~  Baseline CXR w/ scarring LLL, DJD TSpine, otherw WNL.Howard Fisher. last film 12/09  ~  PFT's 12/09= WNL.Howard KitchenMarland Fisher FVC=6.14 (126%), FEV1= 4.94 (150%), %1sec= 80, mid-flows= 174%... ~  CXR 1/12 showed clear lungs, NAD.Howard Fisher. ~  CXR 1/13 showed normal heart size, clear lungs, DJD TSpine, NAD.Howard Fisher. ~  CXR 6/14 showed norm heart size, clear lungs, NAD...  HYPERTENSION (ICD-401.9) - on ASA 81mg /d, & LISINOPRIL 20mg /d...  ~  1/12: BP = 132/82 & similar at home... he is asymptomatic- denies HA, fatigue,  visual changes, CP, palipit, dizziness, syncope, dyspnea, edema, etc...  ~  1/13: BP= 140/90 & even better at home he says; reminded about diet, exercise & wt reduction strategy... ~  EKG 1/13 showed NSR, rate66, WNL/ NAD... ~  1/14:  BP= 140/90 & he denies CP, palpit, dizzy, SOB, edema... ~  EKG 6/14 showed NSR, rate65, rsr' in V1 otherw wnl, NAD... ~  1/15: on ASA81 & Zebeta10; BP= 144/90 but lower on home checks and at [facility] recently; denies CP, palpit, SOB, dizzy, edema.  HYPERCHOLESTEROLEMIA (ICD-272.0) - on SIMVASTATIN80- taking 1/2 tab Qhs... ~  [place] 9/08 showed TChol 133, TG 95, HDL 33, LDL 81 ~  FLP 12/09 showed TChol 177, TG 153, HDL 44, LDL 102... encouraged to take Simva daily. ~  [place] 1/11 showed TChol 152, TG 137, HDL 43, LDL 82 ~  FLP 1/12 on Simva40 showed TChol 147, TG 130, HDL 37, LDL 85 ~  FLP 1/13 on Simva40 showed TChol 161, TG 152, HDL 45, LDL 86... Needs better diet, same med, get wt down. ~  FLP 1/14 on Simva40 showed TChol 141, TG 144, HDL 39, LDL 73... ?he stopped the simva40 on his own. ~  [place] 1/15 on Simva40 showed TChol 161, TG 255, HDL 40, LDL 95; needs better low fat diet & weight reduction.  DIABETES MELLITUS, BORDERLINE (ICD-790.29) - he has been counselled on low carb/ weight reducing diet + exercise program... ~  labs throught the decade of the 2000's showed FBS from 109-121 ~  labs 12/09 showed FBS= 127... rec to avoid sweets and lose weight. ~  labs 1/11 showed FBS= 108, A1c= 6.1 ~  labs 1/12 showed BS= 111, A1c= 6.3 ~  Labs 1/13 showed BS= 124, A1c= 6.4... Still ok on diet alone, but needs to get wt down. ~  Labs 1/14 showed BS= 134, A1c= 6.6 ~  Labs 1/15 on diet alone showed BS= 155, A1c=7.1 and Rec to start METFORMIN ER 500mg Qam...  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 he required dilatation in [place]...  ~  last EGD 1995 showed large HH, GERD w/ ulcerative inflamm, Bx for HPylori= neg. ~  last colonoscopy 11/97  was negative- he is currently overdue for screening colon f/u... ~  1/13:  We will refer chart to GI for f/u colonoscopy=> pt has refused to sched a follow up colonoscopy...  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 he had full eval and Rx from DrEvans in [place] >> BACK PAIN, LUMBAR (  ICD-724.2) ~  3/14: presented w/ question about right bicep after a fall> characteristic ruptured bicep tendon appearance w/ norm ROM, strength, etc...  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 ~  GU:  stable w/ PSA= 0.58 ~  Immunizations:  He declines the seasonal Flu vaccine & the Pneumovax;  TDAP given 1/13   Past Surgical History  Procedure Laterality Date  . Cholecystectomy  02/1993    Dr. Harlow Asa  . Right hernia repair  2002    Dr. Deon Pilling  . Right cataract surgery  11/2009    Dr. Charise Killian    Outpatient Encounter Prescriptions as of 01/15/2014  Medication Sig  . aspirin 81 MG tablet Take 81 mg by mouth daily.   . bisoprolol (ZEBETA) 10 MG tablet Take 1 tablet (10 mg total) by mouth daily.  . fluocinonide-emollient (LIDEX-E) 0.05 % cream Apply topically as needed.  Howard Fisher omeprazole (PRILOSEC) 20 MG capsule Take 1 capsule (20 mg total) by mouth daily.    Allergies  Allergen Reactions  . Penicillins     REACTION: rash and itching    Current Medications, Allergies, Past Medical History, Past Surgical History, Family History, and Social History were reviewed in [employer] record.    Review of Systems        See HPI - all other systems neg except as noted...  The patient denies anorexia, fever, weight loss, weight gain, vision loss, decreased hearing, hoarseness, chest pain, syncope, dyspnea on exertion, peripheral edema, prolonged cough, headaches, hemoptysis, abdominal pain, melena, hematochezia, severe indigestion/heartburn,  hematuria, incontinence, muscle weakness, suspicious skin lesions, transient blindness, difficulty walking, depression, unusual weight change, abnormal bleeding, enlarged lymph nodes, and angioedema.     Objective:   Physical Exam     WD, WN, 73 y/o WM in NAD... GENERAL:  Alert & oriented; pleasant & cooperative... HEENT:  [place]/AT, EOM-wnl, PERRLA, EACs-clear, TMs-wnl, NOSE-clear, THROAT-clear & wnl. NECK:  Supple w/ fairROM; no JVD; normal carotid impulses w/o bruits; no thyromegaly or nodules palpated; no lymphadenopathy. CHEST:  Clear to P & A; without wheezes/ rales/ or rhonchi heard... HEART:  Regular Rhythm; without murmurs/ rubs/ or gallops detected... ABDOMEN:  Soft & nontender; normal bowel sounds; no organomegaly or masses palpated... EXT: without deformities or arthritic changes; no varicose veins/ venous insuffic/ or edema. NEURO:  CN's intact; motor testing normal; sensory testing normal; gait normal & balance OK. DERM:  No lesions noted; no rash etc...  RADIOLOGY DATA:  Reviewed in the EPIC EMR & discussed w/ the patient...   LABORATORY DATA:  Reviewed in the EPIC EMR & discussed w/ the patient...   Assessment:      Hx of Dyspnea>  Prev responded to [place]; now that he is retired I have reiterated the rec for incr exercise program & wt reduction.Howard Fisher  HBP>  Controlled on BBlocker, tol well, continue same/ no salt/ get wt down... ? Of intol to ACE in past (changed by DrWert)...  CHOL>  Controlled on diet & Simva40;  Continue same...  DM>  on diet alone; but w/ wt gain & A1c up to 7.1 we have rewc METFORMIN=ER 500mg  Qam...  HH & GERD>  Stable on Omep, no dysphagia reported; he is overdue for colonoscopy & we will refer to GI...  Hx Peyronies>  Aware, he requested Viagra for ED...  LBP>  Aware, he plays  golf & we reviewed back/ ab strengthening exercises...  Other medical issues as noted...     Plan:     Patient's Medications  New Prescriptions   METFORMIN  (GLUCOPHAGE-XR) 500 MG 24 HR TABLET    Take 1 tablet (500 mg total) by mouth daily with breakfast.   SIMVASTATIN (ZOCOR) 40 MG TABLET    Take 1 tablet (40 mg total) by mouth at bedtime.  Previous Medications   ASPIRIN 81 MG TABLET    Take 81 mg by mouth daily.    FLUOCINONIDE-EMOLLIENT (LIDEX-E) 0.05 % CREAM    Apply topically as needed.  Modified Medications   Modified Medication Previous Medication   BISOPROLOL (ZEBETA) 10 MG TABLET bisoprolol (ZEBETA) 10 MG tablet      Take 1 tablet (10 mg total) by mouth daily.    Take 1 tablet (10 mg total) by mouth daily.   OMEPRAZOLE (PRILOSEC) 20 MG CAPSULE omeprazole (PRILOSEC) 20 MG capsule      Take 1 capsule (20 mg total) by mouth daily.    Take 1 capsule (20 mg total) by mouth daily.  Discontinued Medications   No medications on file

## 2014-01-15 NOTE — Patient Instructions (Signed)
Today we updated your med list in our EPIC system...    Continue your current medications the same...    We refilled your meds for 68mo supplies as requested...  Today we did your follow up FASTING blood work...    We will contact you w/ the results when available...   Let's get on track w/ our diet & exercise program...    The goal is to lose some weight, in 10 lb increments, until you are closer to the 200# range...  Call for any questions.Marland KitchenMarland Kitchen

## 2014-01-16 ENCOUNTER — Other Ambulatory Visit: Payer: Self-pay | Admitting: Pulmonary Disease

## 2014-01-16 MED ORDER — METFORMIN HCL ER 500 MG PO TB24: 500.0000 mg | ORAL_TABLET | Freq: Every day | ORAL | Status: DC

## 2014-01-19 ENCOUNTER — Telehealth: Payer: Self-pay | Admitting: Pulmonary Disease

## 2014-01-19 NOTE — Telephone Encounter (Signed)
Pt could not remember what Dr. Lenna Gilford advised him to watch in his diet.  Advised pt to follow a low carb, low fat diet. He agreed and verbalized understanding.

## 2014-02-03 ENCOUNTER — Telehealth: Payer: Self-pay | Admitting: Pulmonary Disease

## 2014-02-03 ENCOUNTER — Other Ambulatory Visit: Payer: Self-pay | Admitting: Pulmonary Disease

## 2014-02-03 MED ORDER — SIMVASTATIN 40 MG PO TABS: 40.0000 mg | ORAL_TABLET | Freq: Every day | ORAL | Status: DC

## 2014-02-03 NOTE — Telephone Encounter (Signed)
Simvastatin is not on pt's current medication list. It doesn't look we have given him this since 2013. We prescribed Simvastatin 80mg  daily. There is nothing documented on the latest FLP result about pt needing this. Does pt need to be on this medication?  SN - please advise. Thanks.

## 2014-02-03 NOTE — Telephone Encounter (Signed)
Per SN---  Ok for the simvastatin 40 mg 1 daily  #90   Refill prn

## 2014-02-03 NOTE — Telephone Encounter (Signed)
Called and spoke with pt. Aware RX has been sent. Nothing further needed 

## 2014-02-16 ENCOUNTER — Telehealth: Payer: Self-pay | Admitting: Pulmonary Disease

## 2014-02-16 DIAGNOSIS — R7309 Other abnormal glucose: Secondary | ICD-10-CM

## 2014-02-16 MED ORDER — GLUCOSE BLOOD VI STRP: ORAL_STRIP | Status: DC

## 2014-02-16 MED ORDER — ACCU-CHEK AVIVA PLUS W/DEVICE KIT: PACK | Status: DC

## 2014-02-16 NOTE — Telephone Encounter (Signed)
Per SN---  rx for DM meter and supplies.   Ok to refer to nurtrition---called and spoke with pts wife and she will bring the form that needs to be signed by the office tomorrow.  She is aware that the pt needs to be on a low carb diet.   pts wife is aware of rx for the meter that has been sent to the pharmacy.

## 2014-02-16 NOTE — Telephone Encounter (Signed)
Called and spoke with spouse. She has a paper she is needing SN to sign for pt to be able to consult with a nutritionists. She will drop this off. Also she wants to know since pt has DM does he need to be testing checking his sugars? He does not have a meter. Please advise SN thanks

## 2014-02-17 NOTE — Telephone Encounter (Signed)
Forms dropped off to be signes.  Please mail forms to pt.  Please call when ready to mail. [phone]

## 2014-02-18 NOTE — Telephone Encounter (Signed)
Form has been signed by SN and placed in the mail to the pt per pts wife request.  Nothing further is needed.

## 2014-02-20 ENCOUNTER — Telehealth: Payer: Self-pay | Admitting: Pulmonary Disease

## 2014-02-20 NOTE — Telephone Encounter (Signed)
Decided not to LM.  Howard Fisher

## 2014-02-26 ENCOUNTER — Ambulatory Visit: Payer: Medicare Other | Admitting: Pulmonary Disease

## 2014-03-05 ENCOUNTER — Other Ambulatory Visit: Payer: Self-pay | Admitting: Pulmonary Disease

## 2014-03-05 DIAGNOSIS — R7309 Other abnormal glucose: Secondary | ICD-10-CM

## 2014-03-05 DIAGNOSIS — I10 Essential (primary) hypertension: Secondary | ICD-10-CM

## 2014-03-09 ENCOUNTER — Telehealth: Payer: Self-pay | Admitting: Pulmonary Disease

## 2014-03-10 NOTE — Telephone Encounter (Signed)
Called the [facility] office and have scheduled appt with Elyn Aquas on 4/24 at 9:30 for the pt.  i called and spoke with pt and he is aware of appt with the [facility] office.  Nothing further is needed.

## 2014-04-08 ENCOUNTER — Telehealth: Payer: Self-pay | Admitting: Pulmonary Disease

## 2014-04-08 NOTE — Telephone Encounter (Signed)
Leigh have u seen anything on pt? thanks

## 2014-04-10 NOTE — Telephone Encounter (Signed)
Per Leigh they do not have anything on pt LMTCB x1 for wendy

## 2014-04-13 NOTE — Telephone Encounter (Signed)
I spoke with Lajuana Matte at [facility] and advised that we need clearance re-faxed to [place]. She is re-faxing to up front fax. I will forward message to leigh to look-out for fax.

## 2014-04-17 ENCOUNTER — Encounter: Payer: Self-pay | Admitting: Physician Assistant

## 2014-04-17 ENCOUNTER — Ambulatory Visit (INDEPENDENT_AMBULATORY_CARE_PROVIDER_SITE_OTHER): Payer: Medicare Other | Admitting: Physician Assistant

## 2014-04-17 ENCOUNTER — Telehealth: Payer: Self-pay | Admitting: Physician Assistant

## 2014-04-17 VITALS — BP 130/78 | HR 52 | Temp 98.1°F | Resp 16 | Ht 72.0 in | Wt 227.8 lb

## 2014-04-17 DIAGNOSIS — E119 Type 2 diabetes mellitus without complications: Secondary | ICD-10-CM

## 2014-04-17 DIAGNOSIS — K219 Gastro-esophageal reflux disease without esophagitis: Secondary | ICD-10-CM

## 2014-04-17 DIAGNOSIS — E7800 Pure hypercholesterolemia, unspecified: Secondary | ICD-10-CM

## 2014-04-17 DIAGNOSIS — R7309 Other abnormal glucose: Secondary | ICD-10-CM

## 2014-04-17 DIAGNOSIS — I10 Essential (primary) hypertension: Secondary | ICD-10-CM

## 2014-04-17 DIAGNOSIS — Z1211 Encounter for screening for malignant neoplasm of colon: Secondary | ICD-10-CM

## 2014-04-17 LAB — BASIC METABOLIC PANEL
BUN: 14 mg/dL (ref 6–23)
CHLORIDE: 102 meq/L (ref 96–112)
CO2: 27 meq/L (ref 19–32)
Calcium: 9.3 mg/dL (ref 8.4–10.5)
Creat: 1.03 mg/dL (ref 0.50–1.35)
Glucose, Bld: 118 mg/dL — ABNORMAL HIGH (ref 70–99)
Potassium: 4.5 mEq/L (ref 3.5–5.3)
Sodium: 141 mEq/L (ref 135–145)

## 2014-04-17 LAB — HEMOGLOBIN A1C
Hgb A1c MFr Bld: 6.2 % — ABNORMAL HIGH (ref <5.7)
Mean Plasma Glucose: 131 mg/dL — ABNORMAL HIGH (ref <117)

## 2014-04-17 MED ORDER — LISINOPRIL 10 MG PO TABS: 10.0000 mg | ORAL_TABLET | Freq: Every day | ORAL | Status: DC

## 2014-04-17 NOTE — Patient Instructions (Signed)
Please stop the Bisoprolol.  Start taking the Lisinopril as directed -- 1 tablet by mouth daily.  Continue other medications as directed.  Continue with diet and exercise.  Please obtain labs.  I will call you with your results.  We will follow-up in 2-3 weeks for a blood pressure recheck.  Please call or return to clinic sooner if you need anything.    DASH Diet The DASH diet stands for "Dietary Approaches to Stop Hypertension." It is a healthy eating plan that has been shown to reduce high blood pressure (hypertension) in as little as 14 days, while also possibly providing other significant health benefits. These other health benefits include reducing the risk of breast cancer after menopause and reducing the risk of type 2 diabetes, heart disease, colon cancer, and stroke. Health benefits also include weight loss and slowing kidney failure in patients with chronic kidney disease.  DIET GUIDELINES  Limit salt (sodium). Your diet should contain less than 1500 mg of sodium daily.  Limit refined or processed carbohydrates. Your diet should include mostly whole grains. Desserts and added sugars should be used sparingly.  Include small amounts of heart-healthy fats. These types of fats include nuts, oils, and tub margarine. Limit saturated and trans fats. These fats have been shown to be harmful in the body. CHOOSING FOODS  The following food groups are based on a 2000 calorie diet. See your Registered Dietitian for individual calorie needs. Grains and Grain Products (6 to 8 servings daily)  Eat More Often: Whole-wheat bread, brown rice, whole-grain or wheat pasta, quinoa, popcorn without added fat or salt (air popped).  Eat Less Often: White bread, white pasta, white rice, cornbread. Vegetables (4 to 5 servings daily)  Eat More Often: Fresh, frozen, and canned vegetables. Vegetables may be raw, steamed, roasted, or grilled with a minimal amount of fat.  Eat Less Often/Avoid: Creamed or fried  vegetables. Vegetables in a cheese sauce. Fruit (4 to 5 servings daily)  Eat More Often: All fresh, canned (in natural juice), or frozen fruits. Dried fruits without added sugar. One hundred percent fruit juice ( cup [237 mL] daily).  Eat Less Often: Dried fruits with added sugar. Canned fruit in light or heavy syrup. [employer], Fish, and Poultry (2 servings or less daily. One serving is 3 to 4 oz [85-114 g]).  Eat More Often: Ninety percent or leaner ground beef, tenderloin, sirloin. Round cuts of beef, chicken breast, [place] breast. All fish. Grill, bake, or broil your meat. Nothing should be fried.  Eat Less Often/Avoid: Fatty cuts of meat, [place], or chicken leg, thigh, or wing. Fried cuts of meat or fish. Dairy (2 to 3 servings)  Eat More Often: Low-fat or fat-free milk, low-fat plain or light yogurt, reduced-fat or part-skim cheese.  Eat Less Often/Avoid: Milk (whole, 2%).Whole milk yogurt. Full-fat cheeses. Nuts, Seeds, and Legumes (4 to 5 servings per week)  Eat More Often: All without added salt.  Eat Less Often/Avoid: Salted nuts and seeds, canned beans with added salt. Fats and Sweets (limited)  Eat More Often: Vegetable oils, tub margarines without trans fats, sugar-free gelatin. Mayonnaise and salad dressings.  Eat Less Often/Avoid: Coconut oils, palm oils, butter, stick margarine, cream, half and half, cookies, candy, pie. FOR MORE INFORMATION The Dash Diet Eating Plan: www.dashdiet.org Document Released: 11/30/2011 Document Revised: 03/04/2012 Document Reviewed: 11/30/2011 [facility] Patient Information 2014 [place], [place].

## 2014-04-17 NOTE — Progress Notes (Signed)
Pre visit review using our clinic review tool, if applicable. No additional management support is needed unless otherwise documented below in the visit note/SLS  

## 2014-04-17 NOTE — Telephone Encounter (Signed)
Relevant patient education assigned to patient using Emmi. ° °

## 2014-04-17 NOTE — Progress Notes (Signed)
Patient presents to clinic today to establish care.    Acute Concerns: Patient has concerns about blood pressure medication.  Patient states Bisoprolol is too expensive and would like a cheaper alternative.  Patient denies hx of palpitations, tachycardia, MI or CVA.  Patient is a diabetic but is not on ACEI therapy at present.  Has been on lisinopril a few years ago but was switched by another doctor.  Is unsure why.  Denies hx of renal impairment.  Denies experiencing side effect of lisinopril.  Chronic Issues: Hyperlipidemia --  Well-controlled with Zocor 40 mg daily.  Denies myalgias.  Is due in 3 months for repeat fasting lipid panel.  Denies hx of hepatic impairment.  Diabetes Mellitus, Type II -- Recent diagnosis.  Last A1C at 7.1.  Currently on Metformin XR 500 mg daily.  Denies GI upset.  Also with HTN but not on ACEI.  No urine microalbumin has been obtained by previous PCP.  Patient is followed by Opthalmology.  Needs diabetic foot examination.  GERD -- Hx of hiatal hernia.  Symptoms well controlled with his daily PPI.  Health Maintenance: Dental -- UTD Vision -- Followed by Ophthalmology - [facility] Immunizations -- UTD.  Declines Zostavax. Colonoscopy -- Overdue.  12 years ago.  Last Colonoscopy normal.  No family hx of CRC.  Patient refuses referral to GI for colonoscopy.   Past Medical History  Diagnosis Date  . Allergic rhinitis   . Sinusitis   . Dyspnea   . Hypertension   . Hypercholesteremia   . Borderline diabetes mellitus   . Hiatal hernia   . GERD (gastroesophageal reflux disease)   . Peyronie's disease   . Lumbar back pain   . Chicken pox     Past Surgical History  Procedure Laterality Date  . Cholecystectomy  02/1993    Dr. Harlow Asa  . Right hernia repair  2002    Dr. Deon Pilling  . Right cataract surgery  11/2009    Dr. Charise Killian  . Basal cell removed from face  12/2012  . 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 Cataract extraction  2013     Left    Current Outpatient Prescriptions on File Prior to Visit  Medication Sig Dispense Refill  . aspirin 81 MG tablet Take 81 mg by mouth daily.       . Blood Glucose Monitoring Suppl (ACCU-CHEK AVIVA PLUS) W/DEVICE KIT Test blood sugar once daily  1 kit  0  . glucose blood (ACCU-CHEK AVIVA) test strip Use as instructed  100 each  12  . metFORMIN (GLUCOPHAGE-XR) 500 MG 24 hr tablet Take 1 tablet (500 mg total) by mouth daily with breakfast.  90 tablet  3  . omeprazole (PRILOSEC) 20 MG capsule Take 1 capsule (20 mg total) by mouth daily.  90 capsule  3  . simvastatin (ZOCOR) 40 MG tablet Take 1 tablet (40 mg total) by mouth at bedtime.  90 tablet  3   No current facility-administered medications on file prior to visit.    Allergies  Allergen Reactions  . Penicillins     REACTION: rash and itching    Family History  Problem Relation Age of Onset  . Diabetes Mother     Living  . Dementia Father     Deceased  . Dementia Mother   . Diabetes Sister   . Healthy Son     x2  . Healthy Daughter     x1    History  Social History  . Marital Status: Married    Spouse Name: N/A    Number of Children: 1  . 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 Smoking status: Never Smoker   . Smokeless tobacco: Never Used  . Alcohol Use: Yes     Comment: social use  . Drug Use: No  . Sexual Activity: Not on file   Other Topics Concern  . Not on file   Social History Narrative  . No narrative on file   Review of Systems  Constitutional: Negative for fever and weight loss.  HENT: Negative for ear pain, hearing loss and tinnitus.   Eyes: Negative for blurred vision, double vision, photophobia and pain.  Respiratory: Negative for cough and shortness of breath.   Cardiovascular: Negative for chest pain and palpitations.  Gastrointestinal: Negative for heartburn, nausea, vomiting, abdominal pain, diarrhea,  constipation, blood in stool and melena.  Genitourinary: Negative for dysuria, urgency, frequency, hematuria and flank pain.       Nocturia x 0.  Neurological: Negative for dizziness, loss of consciousness and headaches.  Psychiatric/Behavioral: Negative for depression, suicidal ideas, hallucinations and substance abuse. The patient is not nervous/anxious.    BP 130/78  Pulse 52  Temp(Src) 98.1 F (36.7 C) (Oral)  Resp 16  Ht 6' (1.829 m)  Wt 227 lb 12 oz (103.307 kg)  BMI 30.88 kg/m2  SpO2 96%  Physical Exam  Recent Results (from the past 2160 hour(s))  BASIC METABOLIC PANEL     Status: Abnormal   Collection Time    04/17/14 10:26 AM      Result Value Ref Range   Sodium 141  135 - 145 mEq/L   Potassium 4.5  3.5 - 5.3 mEq/L   Chloride 102  96 - 112 mEq/L   CO2 27  19 - 32 mEq/L   Glucose, Bld 118 (*) 70 - 99 mg/dL   BUN 14  6 - 23 mg/dL   Creat 1.03  0.50 - 1.35 mg/dL   Calcium 9.3  8.4 - 10.5 mg/dL  HEMOGLOBIN A1C     Status: Abnormal   Collection Time    04/17/14 10:26 AM      Result Value Ref Range   Hemoglobin A1C 6.2 (*) <5.7 %   Comment:                                                                            According to the ADA Clinical Practice Recommendations for 2011, when     HbA1c is used as a screening test:             >=6.5%   Diagnostic of Diabetes Mellitus                (if abnormal result is confirmed)           5.7-6.4%   Increased risk of developing Diabetes Mellitus           References:Diagnosis and Classification of Diabetes Mellitus,Diabetes     NLGX,2119,41(DEYCX 1):S62-S69 and Standards of Medical Care in  Diabetes - 2011,Diabetes Care,2011,34 (Suppl 1):S11-S61.         Mean Plasma Glucose 131 (*) <117 mg/dL  MICROALBUMIN / CREATININE URINE RATIO     Status: None   Collection Time    04/17/14 10:26 AM      Result Value Ref Range   Microalb, Ur 1.02  0.00 - 1.89 mg/dL   Creatinine, Urine 433.9     Comment: Result  confirmed by automatic dilution.     Result repeated and verified.   Microalb Creat Ratio 2.4  0.0 - 30.0 mg/g    Assessment/Plan: HYPERTENSION Stop Bisoprolol due to cost.  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  Patient also with type II diabetes.  Will start 10 mg Lisinopril.  DASH diet given.  Follow-up in 2 weeks for BP recheck.  GERD Continue current regimen.  HYPERCHOLESTEROLEMIA Continue current regimen.  Will obtain hepatic function testing and fasting lipids.  DIABETES MELLITUS, BORDERLINE Recheck BMP and A1C.  Will also check microalbumin.  Lisinopril started for HTN.  Colon cancer screening Patient refuses colonoscopy and hemoccult.  Will readdress at visit in 2 weeks. Discussed risks of declining screening.

## 2014-04-18 LAB — MICROALBUMIN / CREATININE URINE RATIO
CREATININE, URINE: 433.9 mg/dL
MICROALB/CREAT RATIO: 2.4 mg/g (ref 0.0–30.0)
Microalb, Ur: 1.02 mg/dL (ref 0.00–1.89)

## 2014-04-21 ENCOUNTER — Ambulatory Visit: Payer: Medicare Other | Admitting: Pulmonary Disease

## 2014-04-21 DIAGNOSIS — Z1211 Encounter for screening for malignant neoplasm of colon: Secondary | ICD-10-CM | POA: Insufficient documentation

## 2014-04-21 NOTE — Assessment & Plan Note (Signed)
Recheck BMP and A1C.  Will also check microalbumin.  Lisinopril started for HTN.

## 2014-04-21 NOTE — Assessment & Plan Note (Signed)
Stop Bisoprolol due to cost.  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  Patient also with type II diabetes.  Will start 10 mg Lisinopril.  DASH diet given.  Follow-up in 2 weeks for BP recheck.

## 2014-04-21 NOTE — Assessment & Plan Note (Signed)
Continue current regimen.  Will obtain hepatic function testing and fasting lipids.

## 2014-04-21 NOTE — Assessment & Plan Note (Signed)
Continue current regimen

## 2014-04-21 NOTE — Assessment & Plan Note (Signed)
Patient refuses colonoscopy and hemoccult.  Will readdress at visit in 2 weeks. Discussed risks of declining screening.

## 2014-04-22 ENCOUNTER — Encounter: Payer: Self-pay | Admitting: *Deleted

## 2014-04-24 NOTE — Telephone Encounter (Signed)
Leigh, Has this fax came across yet?

## 2014-04-28 NOTE — Telephone Encounter (Signed)
Leigh, has this been taken care of and can we sign the phone note? Thanks.

## 2014-04-29 NOTE — Telephone Encounter (Signed)
Forms have been faxed back and they were scanned into the pts chart.

## 2014-05-07 ENCOUNTER — Ambulatory Visit (INDEPENDENT_AMBULATORY_CARE_PROVIDER_SITE_OTHER): Payer: Medicare Other | Admitting: Physician Assistant

## 2014-05-07 ENCOUNTER — Encounter: Payer: Self-pay | Admitting: Physician Assistant

## 2014-05-07 VITALS — BP 120/72 | HR 72 | Temp 98.0°F | Resp 16 | Ht 72.0 in | Wt 224.8 lb

## 2014-05-07 DIAGNOSIS — Z1211 Encounter for screening for malignant neoplasm of colon: Secondary | ICD-10-CM

## 2014-05-07 DIAGNOSIS — I10 Essential (primary) hypertension: Secondary | ICD-10-CM

## 2014-05-07 DIAGNOSIS — E785 Hyperlipidemia, unspecified: Secondary | ICD-10-CM

## 2014-05-07 DIAGNOSIS — E119 Type 2 diabetes mellitus without complications: Secondary | ICD-10-CM

## 2014-05-07 MED ORDER — LISINOPRIL 10 MG PO TABS: 10.0000 mg | ORAL_TABLET | Freq: Every day | ORAL | Status: DC

## 2014-05-07 NOTE — Progress Notes (Signed)
Pre visit review using our clinic review tool, if applicable. No additional management support is needed unless otherwise documented below in the visit note/SLS  

## 2014-05-07 NOTE — Assessment & Plan Note (Signed)
Referral placed to GI for screening colonoscopy 

## 2014-05-07 NOTE — Assessment & Plan Note (Signed)
BP normotensive.  Asymptomatic.  Denies side effect of new medication.  Continue current regimen.  Patient to return in July for labs -- CMP, Lipid panel and A1C.

## 2014-05-07 NOTE — Progress Notes (Signed)
Patient presents to clinic today for follow-up of hypertension.  At last visit patient was switched from Bystolic to lisinopril both for cost and due to patient having diagnosis of diabetes.  Patient endorses taking medication as prescribed.  Denies cough.  Denies chest pain, palpitations, LH, dizziness, headaches or vision changes.  BP normotensive in clinic today.  Patient also has finally decided to proceed with screening colonoscopy.  Will need referral to GI.  Past Medical History  Diagnosis Date  . Allergic rhinitis   . Sinusitis   . Dyspnea   . Hypertension   . Hypercholesteremia   . Borderline diabetes mellitus   . Hiatal hernia   . GERD (gastroesophageal reflux disease)   . Peyronie's disease   . Lumbar back pain   . Chicken pox     Current Outpatient Prescriptions on File Prior to Visit  Medication Sig Dispense Refill  . aspirin 81 MG tablet Take 81 mg by mouth daily.       . Blood Glucose Monitoring Suppl (ACCU-CHEK AVIVA PLUS) W/DEVICE KIT Test blood sugar once daily  1 kit  0  . glucose blood (ACCU-CHEK AVIVA) test strip Use as instructed  100 each  12  . metFORMIN (GLUCOPHAGE-XR) 500 MG 24 hr tablet Take 1 tablet (500 mg total) by mouth daily with breakfast.  90 tablet  3  . omeprazole (PRILOSEC) 20 MG capsule Take 1 capsule (20 mg total) by mouth daily.  90 capsule  3  . simvastatin (ZOCOR) 40 MG tablet Take 1 tablet (40 mg total) by mouth at bedtime.  90 tablet  3   No current facility-administered medications on file prior to visit.    Allergies  Allergen Reactions  . Penicillins     REACTION: rash and itching    Family History  Problem Relation Age of Onset  . Diabetes Mother     Living  . Dementia Father     Deceased  . Dementia Mother   . Diabetes Sister   . Healthy Son     x2  . Healthy Daughter     x1    History   Social History  . Marital Status: Married    Spouse Name: N/A    Number of Children: 1  . Years of Education: N/A    Occupational History  . retired Administrator    Social History Main Topics  . Smoking status: Never Smoker   . Smokeless tobacco: Never Used  . Alcohol Use: Yes     Comment: social use  . Drug Use: No  . Sexual Activity: None   Other Topics Concern  . None   Social History Narrative  . None   Review of Systems - See HPI.  All other ROS are negative.  BP 120/72  Pulse 72  Temp(Src) 98 F (36.7 C) (Oral)  Resp 16  Ht 6' (1.829 m)  Wt 224 lb 12 oz (101.946 kg)  BMI 30.47 kg/m2  SpO2 94%  Physical Exam  Vitals reviewed. Constitutional: He is oriented to person, place, and time and well-developed, well-nourished, and in no distress.  HENT:  Head: Normocephalic and atraumatic.  Eyes: Conjunctivae are normal. Pupils are equal, round, and reactive to light.  Neck: Neck supple.  Cardiovascular: Normal rate, regular rhythm, normal heart sounds and intact distal pulses.   Pulmonary/Chest: Effort normal and breath sounds normal. No respiratory distress. He has no wheezes. He has no rales. He exhibits no tenderness.  Neurological: He is alert and  oriented to person, place, and time. No cranial nerve deficit.  Skin: Skin is warm and dry. No rash noted.  Psychiatric: Affect normal.   Recent Results (from the past 2160 hour(s))  BASIC METABOLIC PANEL     Status: Abnormal   Collection Time    04/17/14 10:26 AM      Result Value Ref Range   Sodium 141  135 - 145 mEq/L   Potassium 4.5  3.5 - 5.3 mEq/L   Chloride 102  96 - 112 mEq/L   CO2 27  19 - 32 mEq/L   Glucose, Bld 118 (*) 70 - 99 mg/dL   BUN 14  6 - 23 mg/dL   Creat 1.03  0.50 - 1.35 mg/dL   Calcium 9.3  8.4 - 10.5 mg/dL  HEMOGLOBIN A1C     Status: Abnormal   Collection Time    04/17/14 10:26 AM      Result Value Ref Range   Hemoglobin A1C 6.2 (*) <5.7 %   Comment:                                                                            According to the ADA Clinical Practice Recommendations for 2011, when      HbA1c is used as a screening test:             >=6.5%   Diagnostic of Diabetes Mellitus                (if abnormal result is confirmed)           5.7-6.4%   Increased risk of developing Diabetes Mellitus           References:Diagnosis and Classification of Diabetes Mellitus,Diabetes     QMVH,8469,62(XBMWU 1):S62-S69 and Standards of Medical Care in             Diabetes - 2011,Diabetes Care,2011,34 (Suppl 1):S11-S61.         Mean Plasma Glucose 131 (*) <117 mg/dL  MICROALBUMIN / CREATININE URINE RATIO     Status: None   Collection Time    04/17/14 10:26 AM      Result Value Ref Range   Microalb, Ur 1.02  0.00 - 1.89 mg/dL   Creatinine, Urine 433.9     Comment: Result confirmed by automatic dilution.     Result repeated and verified.   Microalb Creat Ratio 2.4  0.0 - 30.0 mg/g    Assessment/Plan: HYPERTENSION BP normotensive.  Asymptomatic.  Denies side effect of new medication.  Continue current regimen.  Patient to return in July for labs -- CMP, Lipid panel and A1C.  Colon cancer screening Referral placed to GI for screening colonoscopy.

## 2014-05-07 NOTE — Patient Instructions (Addendum)
Please continue lisinopril daily.  Continue other medications as directed.  Follow-up with nutritionist as directed.  Congratulations on your hard work in lowering your weight and blood sugar.  You will be contacted by GI for a consult to have a colonoscopy.  Please return to lab in July for blood work -- we will be checking a lipid panel (for cholesterol) and a CMP (for liver, kidney and electrolyte assessment giving your current medications.) Return to clinic sooner as needed.  Colonoscopy A colonoscopy is an exam to look at the entire large intestine (colon). This exam can help find problems such as tumors, polyps, inflammation, and areas of bleeding. The exam takes about 1 hour.  LET [facility] CARE PROVIDER KNOW ABOUT:   Any allergies you have.  All medicines you are taking, including vitamins, herbs, eye drops, creams, and over-the-counter medicines.  Previous problems you or members of your family have had with the use of anesthetics.  Any blood disorders you have.  Previous surgeries you have had.  Medical conditions you have. RISKS AND COMPLICATIONS  Generally, this is a safe procedure. However, as with any procedure, complications can occur. Possible complications include:  Bleeding.  Tearing or rupture of the colon wall.  Reaction to medicines given during the exam.  Infection (rare). BEFORE THE PROCEDURE   Ask your health care provider about changing or stopping your regular medicines.  You may be prescribed an oral bowel prep. This involves drinking a large amount of medicated liquid, starting the day before your procedure. The liquid will cause you to have multiple loose stools until your stool is almost clear or light green. This cleans out your colon in preparation for the procedure.  Do not eat or drink anything else once you have started the bowel prep, unless your health care provider tells you it is safe to do so.  Arrange for someone to drive you home after  the procedure. PROCEDURE   You will be given medicine to help you relax (sedative).  You will lie on your side with your knees bent.  A long, flexible tube with a light and camera on the end (colonoscope) will be inserted through the rectum and into the colon. The camera sends video back to a computer screen as it moves through the colon. The colonoscope also releases carbon dioxide gas to inflate the colon. This helps your health care provider see the area better.  During the exam, your health care provider may take a small tissue sample (biopsy) to be examined under a microscope if any abnormalities are found.  The exam is finished when the entire colon has been viewed. AFTER THE PROCEDURE   Do not drive for 24 hours after the exam.  You may have a small amount of blood in your stool.  You may pass moderate amounts of gas and have mild abdominal cramping or bloating. This is caused by the gas used to inflate your colon during the exam.  Ask when your test results will be ready and how you will get your results. Make sure you get your test results. Document Released: 12/08/2000 Document Revised: 10/01/2013 Document Reviewed: 08/18/2013 [facility] Patient Information 2014 [place].

## 2014-05-12 ENCOUNTER — Telehealth: Payer: Self-pay | Admitting: Pulmonary Disease

## 2014-05-12 ENCOUNTER — Encounter: Payer: Self-pay | Admitting: Internal Medicine

## 2014-05-12 NOTE — Telephone Encounter (Signed)
Pt does not need to talk to the nurse. She found the information she needed.  Nothing further needed at this time.

## 2014-06-10 ENCOUNTER — Ambulatory Visit (AMBULATORY_SURGERY_CENTER): Payer: Self-pay | Admitting: *Deleted

## 2014-06-10 VITALS — Ht 73.0 in | Wt 217.2 lb

## 2014-06-10 DIAGNOSIS — Z1211 Encounter for screening for malignant neoplasm of colon: Secondary | ICD-10-CM

## 2014-06-10 MED ORDER — MOVIPREP 100 G PO SOLR: ORAL | Status: DC

## 2014-06-10 NOTE — Progress Notes (Signed)
No allergies to eggs or soy. No problems with anesthesia.  Pt given Emmi instructions for colonoscopy  No oxygen use  No diet drug use  

## 2014-06-22 ENCOUNTER — Encounter: Payer: Self-pay | Admitting: Internal Medicine

## 2014-07-01 ENCOUNTER — Ambulatory Visit (AMBULATORY_SURGERY_CENTER): Payer: Medicare Other | Admitting: Internal Medicine

## 2014-07-01 ENCOUNTER — Encounter: Payer: Self-pay | Admitting: Internal Medicine

## 2014-07-01 VITALS — BP 136/93 | HR 57 | Temp 97.6°F | Resp 31 | Ht 73.0 in | Wt 217.0 lb

## 2014-07-01 DIAGNOSIS — D126 Benign neoplasm of colon, unspecified: Secondary | ICD-10-CM

## 2014-07-01 DIAGNOSIS — Z1211 Encounter for screening for malignant neoplasm of colon: Secondary | ICD-10-CM

## 2014-07-01 MED ORDER — SODIUM CHLORIDE 0.9 % IV SOLN: 500.0000 mL | INTRAVENOUS | Status: DC

## 2014-07-01 NOTE — Progress Notes (Signed)
Called to room to assist during endoscopic procedure.  Patient ID and intended procedure confirmed with present staff. Received instructions for my participation in the procedure from the performing physician.  

## 2014-07-01 NOTE — Op Note (Signed)
[place]  [address] [place], [address]   COLONOSCOPY PROCEDURE REPORT  PATIENT: Howard Fisher, Howard Fisher  MR#: [id number] BIRTHDATE: [birth year] , 73  yrs. old GENDER: Male ENDOSCOPIST: Jerene Bears, MD REFERRED BY: Elyn Aquas, MD PROCEDURE DATE:  07/01/2014 PROCEDURE:   Colonoscopy with snare polypectomy and Colonoscopy with cold biopsy polypectomy First Screening Colonoscopy - Avg.  risk and is 50 yrs.  old or older - No.  Prior Negative Screening - Now for repeat screening. 10 or more years since last screening  History of Adenoma - Now for follow-up colonoscopy & has been > or = to 3 yrs.  N/A  Polyps Removed Today? Yes. ASA CLASS:   Class III INDICATIONS:average risk screening and Last colonoscopy performed 1997. MEDICATIONS: MAC sedation, administered by CRNA and propofol (Diprivan) 200mg  IV  DESCRIPTION OF PROCEDURE:   After the risks benefits and alternatives of the procedure were thoroughly explained, informed consent was obtained.  A digital rectal exam revealed no rectal mass.   The LB [id number]  endoscope was introduced through the anus and advanced to the cecum, which was identified by both the appendix and ileocecal valve. No adverse events experienced. The quality of the prep was good, using MoviPrep  The instrument was then slowly withdrawn as the colon was fully examined.  COLON FINDINGS: Two sessile polyps measuring 6 and 2 mm in size were found at the cecum.  Polypectomy was performed using cold snare and with cold forceps.  All resections were complete and all polyp tissue was completely retrieved.   Mild diverticulosis was noted in the ascending colon and sigmoid colon. Retroflexed views revealed moderate internal hemorrhoids. The time to cecum=4 minutes 10 seconds.  Withdrawal time=14 minutes 44 seconds.  The scope was withdrawn and the procedure completed. COMPLICATIONS: There were no complications.  ENDOSCOPIC IMPRESSION: 1.    Two sessile polyps measuring 6 and 2 mm in size were found at the cecum; Polypectomy was performed using cold snare and with cold forceps 2.   Mild diverticulosis was noted in the ascending colon and sigmoid colon 3.   Internal hemorrhoids  RECOMMENDATIONS: 1.  Await pathology results 2.  High fiber diet 3.  Timing of repeat colonoscopy will be determined by pathology findings. 4.  You will receive a letter within 1-2 weeks with the results of your biopsy as well as final recommendations.  Please call my office if you have not received a letter after 3 weeks.   eSigned:  Jerene Bears, MD 07/01/2014 10:16 AM       cc: The Patient; Elyn Aquas, MD

## 2014-07-01 NOTE — Patient Instructions (Addendum)
YOU HAD AN ENDOSCOPIC PROCEDURE TODAY AT THE [facility]: Refer to the procedure report that was given to you for any specific questions about what was found during the examination.  If the procedure report does not answer your questions, please call your gastroenterologist to clarify.  If you requested that your care partner not be given the details of your procedure findings, then the procedure report has been included in a sealed envelope for you to review at your convenience later.  YOU SHOULD EXPECT: Some feelings of bloating in the abdomen. Passage of more gas than usual.  Walking can help get rid of the air that was put into your GI tract during the procedure and reduce the bloating. If you had a lower endoscopy (such as a colonoscopy or flexible sigmoidoscopy) you may notice spotting of blood in your stool or on the toilet paper. If you underwent a bowel prep for your procedure, then you may not have a normal bowel movement for a few days.  DIET: Your first meal following the procedure should be a light meal and then it is ok to progress to your normal diet.  A half-sandwich or bowl of soup is an example of a good first meal.  Heavy or fried foods are harder to digest and may make you feel nauseous or bloated.  Likewise meals heavy in dairy and vegetables can cause extra gas to form and this can also increase the bloating.  Drink plenty of fluids but you should avoid alcoholic beverages for 24 hours.  ACTIVITY: Your care partner should take you home directly after the procedure.  You should plan to take it easy, moving slowly for the rest of the day.  You can resume normal activity the day after the procedure however you should NOT DRIVE or use heavy machinery for 24 hours (because of the sedation medicines used during the test).    SYMPTOMS TO REPORT IMMEDIATELY: A gastroenterologist can be reached at any hour.  During normal business hours, 8:30 AM to 5:00 PM Monday through Friday,  call [phone].  After hours and on weekends, please call the GI answering service at [phone] who will take a message and have the physician on call contact you.   Following lower endoscopy (colonoscopy or flexible sigmoidoscopy):  Excessive amounts of blood in the stool  Significant tenderness or worsening of abdominal pains  Swelling of the abdomen that is new, acute  Fever of 100F or higher  FOLLOW UP: If any biopsies were taken you will be contacted by phone or by letter within the next 1-3 weeks.  Call your gastroenterologist if you have not heard about the biopsies in 3 weeks.  Our staff will call the home number listed on your records the next business day following your procedure to check on you and address any questions or concerns that you may have at that time regarding the information given to you following your procedure. This is a courtesy call and so if there is no answer at the home number and we have not heard from you through the emergency physician on call, we will assume that you have returned to your regular daily activities without incident.  SIGNATURES/CONFIDENTIALITY: You and/or your care partner have signed paperwork which will be entered into your electronic medical record.  These signatures attest to the fact that that the information above on your After Visit Summary has been reviewed and is understood.  Full responsibility of the confidentiality of this   discharge information lies with you and/or your care-partner.  Polyps, diverticulosis, high fiber diet, and hemorrhoids information given.

## 2014-07-01 NOTE — Progress Notes (Signed)
A/ox3 pleased with MAC, report to Jane RN 

## 2014-07-02 ENCOUNTER — Telehealth: Payer: Self-pay | Admitting: *Deleted

## 2014-07-02 NOTE — Telephone Encounter (Signed)
Number identifier, left message, follow-up  

## 2014-07-06 ENCOUNTER — Encounter: Payer: Self-pay | Admitting: Family Medicine

## 2014-07-07 ENCOUNTER — Encounter: Payer: Self-pay | Admitting: Internal Medicine

## 2014-09-01 ENCOUNTER — Ambulatory Visit (INDEPENDENT_AMBULATORY_CARE_PROVIDER_SITE_OTHER): Payer: Medicare Other | Admitting: Physician Assistant

## 2014-09-01 ENCOUNTER — Encounter: Payer: Self-pay | Admitting: Physician Assistant

## 2014-09-01 VITALS — BP 135/70 | HR 66 | Temp 97.9°F | Wt 210.0 lb

## 2014-09-01 DIAGNOSIS — E1165 Type 2 diabetes mellitus with hyperglycemia: Secondary | ICD-10-CM | POA: Insufficient documentation

## 2014-09-01 DIAGNOSIS — E119 Type 2 diabetes mellitus without complications: Secondary | ICD-10-CM

## 2014-09-01 LAB — BASIC METABOLIC PANEL
BUN: 12 mg/dL (ref 6–23)
CHLORIDE: 102 meq/L (ref 96–112)
CO2: 30 meq/L (ref 19–32)
Calcium: 9.1 mg/dL (ref 8.4–10.5)
Creatinine, Ser: 1 mg/dL (ref 0.4–1.5)
GFR: 74.12 mL/min (ref >60.00)
GLUCOSE: 111 mg/dL — AB (ref 70–99)
Potassium: 3.9 mEq/L (ref 3.5–5.1)
SODIUM: 138 meq/L (ref 135–145)

## 2014-09-01 LAB — HEMOGLOBIN A1C: Hgb A1c MFr Bld: 6.1 % (ref 4.6–6.5)

## 2014-09-01 NOTE — Patient Instructions (Addendum)
Please continue medications as directed with the following exceptions -- Take Omeprazole every other day for 2 weeks.  Then try to wean off medication completely.  If you notice a recurrence of acid reflux symptoms, restart medication taking it every other day.  If your A1C has continued to stay at goal range, we will attempt a trial of 3 months off of medication.  I will call you with further instructions once your lab results are in.  A1C Measurements:   < 5.7 -- normal  5.7-6.4 -- Pre-diabetic range   >6.5 -- Diabetes  At last check you were sitting at 6.2.  We will see if this has continued to improve!

## 2014-09-01 NOTE — Progress Notes (Signed)
Pre visit review using our clinic review tool, if applicable. No additional management support is needed unless otherwise documented below in the visit note. 

## 2014-09-01 NOTE — Assessment & Plan Note (Signed)
Will repeat BMP and A1C.  Foot exam completed without abnormality.  Continue current regimen.  If A1C is still at goal, will try 3 month period off of the metformin.

## 2014-09-01 NOTE — Progress Notes (Signed)
Patient presents to clinic today for follow-up regarding his Type II Diabetes.  Patient still taking Metformin XR 500 mg once daily. Last A1C checked on 04/17/14 was at 6.2.  Patient has continued seeing a nutritionist and has lost a total of ~30 pounds.  Patient states he is doing well and would like to discuss a trial period off of the Metformin.  Past Medical History  Diagnosis Date  . Allergic rhinitis   . Sinusitis   . Dyspnea   . Hypertension   . Hypercholesteremia   . Borderline diabetes mellitus   . Hiatal hernia   . GERD (gastroesophageal reflux disease)   . Peyronie's disease   . Lumbar back pain   . Chicken pox   . Diabetes mellitus without complication   . Melanoma 2014    shoulder  . 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 History of stomach ulcers     1985    Current Outpatient Prescriptions on File Prior to Visit  Medication Sig Dispense Refill  . aspirin 81 MG tablet Take 81 mg by mouth daily.       . Blood Glucose Monitoring Suppl (ACCU-CHEK AVIVA PLUS) W/DEVICE KIT Test blood sugar once daily  1 kit  0  . glucose blood (ACCU-CHEK AVIVA) test strip Use as instructed  100 each  12  . lisinopril (PRINIVIL,ZESTRIL) 10 MG tablet Take 1 tablet (10 mg total) by mouth daily.  90 tablet  3  . metFORMIN (GLUCOPHAGE-XR) 500 MG 24 hr tablet Take 1 tablet (500 mg total) by mouth daily with breakfast.  90 tablet  3  . omeprazole (PRILOSEC) 20 MG capsule Take 1 capsule (20 mg total) by mouth daily.  90 capsule  3  . simvastatin (ZOCOR) 40 MG tablet Take 1 tablet (40 mg total) by mouth at bedtime.  90 tablet  3   No current facility-administered medications on file prior to visit.    Allergies  Allergen Reactions  . Penicillins     REACTION: rash and itching    Family History  Problem Relation Age of Onset  . Diabetes Mother     Living  . Dementia Mother   . Dementia Father     Deceased  . Diabetes Sister   . Healthy Son     x2  . Healthy Daughter     x1  .  Colon cancer Neg Hx     History   Social History  . Marital Status: Married    Spouse Name: N/A    Number of Children: 1  . 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 Smoking status: Never Smoker   . Smokeless tobacco: Never Used  . Alcohol Use: 0.6 oz/week    1 Glasses of wine per week  . Drug Use: No  . Sexual Activity: None   Other Topics Concern  . None   Social History Narrative  . None   Review of Systems - See HPI.  All other ROS are negative.  BP 135/70  Pulse 66  Temp(Src) 97.9 F (36.6 C)  Wt 210 lb (95.255 kg)  SpO2 95%  Physical Exam  Vitals reviewed. Constitutional: He is oriented to person, place, and time and well-developed, well-nourished, and in no distress.  HENT:  Head: Normocephalic and atraumatic.  Eyes: Conjunctivae are normal.  Neck: Neck supple.  Cardiovascular: Normal rate, regular rhythm, normal heart sounds and  intact distal pulses.   Pulmonary/Chest: Effort normal and breath sounds normal. No respiratory distress. He has no wheezes. He has no rales. He exhibits no tenderness.  Neurological: He is alert and oriented to person, place, and time.  Skin: Skin is warm and dry. No rash noted.  Psychiatric: Affect normal.   Assessment/Plan: Controlled diabetes mellitus type II without complication Will repeat BMP and A1C.  Foot exam completed without abnormality.  Continue current regimen.  If A1C is still at goal, will try 3 month period off of the metformin.

## 2014-09-02 ENCOUNTER — Ambulatory Visit: Payer: Medicare Other | Admitting: Physician Assistant

## 2014-12-23 ENCOUNTER — Encounter: Payer: Self-pay | Admitting: Physician Assistant

## 2014-12-23 ENCOUNTER — Ambulatory Visit (INDEPENDENT_AMBULATORY_CARE_PROVIDER_SITE_OTHER): Payer: Medicare Other | Admitting: Physician Assistant

## 2014-12-23 VITALS — BP 147/87 | HR 62 | Temp 97.9°F | Resp 16 | Ht 73.0 in | Wt 216.4 lb

## 2014-12-23 DIAGNOSIS — I10 Essential (primary) hypertension: Secondary | ICD-10-CM

## 2014-12-23 DIAGNOSIS — Z125 Encounter for screening for malignant neoplasm of prostate: Secondary | ICD-10-CM

## 2014-12-23 DIAGNOSIS — Z0000 Encounter for general adult medical examination without abnormal findings: Secondary | ICD-10-CM

## 2014-12-23 DIAGNOSIS — Z136 Encounter for screening for cardiovascular disorders: Secondary | ICD-10-CM

## 2014-12-23 DIAGNOSIS — E119 Type 2 diabetes mellitus without complications: Secondary | ICD-10-CM

## 2014-12-23 LAB — URINALYSIS, ROUTINE W REFLEX MICROSCOPIC
Bilirubin Urine: NEGATIVE
Hgb urine dipstick: NEGATIVE
Ketones, ur: NEGATIVE
Leukocytes, UA: NEGATIVE
Nitrite: NEGATIVE
PH: 5.5 (ref 5.0–8.0)
RBC / HPF: NONE SEEN (ref 0–?)
Specific Gravity, Urine: >=1.03 — AB (ref 1.000–1.030)
TOTAL PROTEIN, URINE-UPE24: NEGATIVE
Urine Glucose: NEGATIVE
Urobilinogen, UA: 0.2 (ref 0.0–1.0)

## 2014-12-23 LAB — LIPID PANEL
CHOLESTEROL: 131 mg/dL (ref 0–200)
HDL: 37.6 mg/dL — ABNORMAL LOW (ref >39.00)
LDL Cholesterol: 72 mg/dL (ref 0–99)
NONHDL: 93.4
Total CHOL/HDL Ratio: 3
Triglycerides: 106 mg/dL (ref 0.0–149.0)
VLDL: 21.2 mg/dL (ref 0.0–40.0)

## 2014-12-23 LAB — HEPATIC FUNCTION PANEL
ALK PHOS: 56 U/L (ref 39–117)
ALT: 25 U/L (ref 0–53)
AST: 31 U/L (ref 0–37)
Albumin: 4.2 g/dL (ref 3.5–5.2)
BILIRUBIN DIRECT: 0.2 mg/dL (ref 0.0–0.3)
TOTAL PROTEIN: 6.8 g/dL (ref 6.0–8.3)
Total Bilirubin: 0.7 mg/dL (ref 0.2–1.2)

## 2014-12-23 LAB — PSA: PSA: 0.89 ng/mL (ref 0.10–4.00)

## 2014-12-23 LAB — BASIC METABOLIC PANEL
BUN: 14 mg/dL (ref 6–23)
CALCIUM: 9.6 mg/dL (ref 8.4–10.5)
CO2: 27 mEq/L (ref 19–32)
Chloride: 105 mEq/L (ref 96–112)
Creatinine, Ser: 1.1 mg/dL (ref 0.4–1.5)
GFR: 70.15 mL/min (ref >60.00)
Glucose, Bld: 117 mg/dL — ABNORMAL HIGH (ref 70–99)
Potassium: 4.3 mEq/L (ref 3.5–5.1)
Sodium: 141 mEq/L (ref 135–145)

## 2014-12-23 LAB — HEMOGLOBIN A1C: Hgb A1c MFr Bld: 6.3 % (ref 4.6–6.5)

## 2014-12-23 NOTE — Patient Instructions (Addendum)
Continue medications as directed.  I will call you with your results.  We will alter medication if needed.  Follow-up in 6 months.  Fat and Cholesterol Control Diet Fat and cholesterol levels in your blood and organs are influenced by your diet. High levels of fat and cholesterol may lead to diseases of the heart, small and large blood vessels, gallbladder, liver, and pancreas. CONTROLLING FAT AND CHOLESTEROL WITH DIET Although exercise and lifestyle factors are important, your diet is key. That is because certain foods are known to raise cholesterol and others to lower it. The goal is to balance foods for their effect on cholesterol and more importantly, to replace saturated and trans fat with other types of fat, such as monounsaturated fat, polyunsaturated fat, and omega-3 fatty acids. On average, a person should consume no more than 15 to 17 g of saturated fat daily. Saturated and trans fats are considered "bad" fats, and they will raise LDL cholesterol. Saturated fats are primarily found in animal products such as meats, butter, and cream. However, that does not mean you need to give up all your favorite foods. Today, there are good tasting, low-fat, low-cholesterol substitutes for most of the things you like to eat. Choose low-fat or nonfat alternatives. Choose round or loin cuts of red meat. These types of cuts are lowest in fat and cholesterol. Chicken (without the skin), fish, veal, and ground [place] breast are great choices. Eliminate fatty meats, such as hot dogs and salami. Even shellfish have little or no saturated fat. Have a 3 oz (85 g) portion when you eat lean meat, poultry, or fish. Trans fats are also called "partially hydrogenated oils." They are oils that have been scientifically manipulated so that they are solid at room temperature resulting in a longer shelf life and improved taste and texture of foods in which they are added. Trans fats are found in stick margarine, some tub  margarines, cookies, crackers, and baked goods.  When baking and cooking, oils are a great substitute for butter. The monounsaturated oils are especially beneficial since it is believed they lower LDL and raise HDL. The oils you should avoid entirely are saturated tropical oils, such as coconut and palm.  Remember to eat a lot from food groups that are naturally free of saturated and trans fat, including fish, fruit, vegetables, beans, grains (barley, rice, couscous, bulgur wheat), and pasta (without cream sauces).  IDENTIFYING FOODS THAT LOWER FAT AND CHOLESTEROL  Soluble fiber may lower your cholesterol. This type of fiber is found in fruits such as apples, vegetables such as broccoli, potatoes, and carrots, legumes such as beans, peas, and lentils, and grains such as barley. Foods fortified with plant sterols (phytosterol) may also lower cholesterol. You should eat at least 2 g per day of these foods for a cholesterol lowering effect.  Read package labels to identify low-saturated fats, trans fat free, and low-fat foods at the supermarket. Select cheeses that have only 2 to 3 g saturated fat per ounce. Use a heart-healthy tub margarine that is free of trans fats or partially hydrogenated oil. When buying baked goods (cookies, crackers), avoid partially hydrogenated oils. Breads and muffins should be made from whole grains (whole-wheat or whole oat flour, instead of "flour" or "enriched flour"). Buy non-creamy canned soups with reduced salt and no added fats.  FOOD PREPARATION TECHNIQUES  Never deep-fry. If you must fry, either stir-fry, which uses very little fat, or use non-stick cooking sprays. When possible, broil, bake, or roast meats,  and steam vegetables. Instead of putting butter or margarine on vegetables, use lemon and herbs, applesauce, and cinnamon (for squash and sweet potatoes). Use nonfat yogurt, salsa, and low-fat dressings for salads.  LOW-SATURATED FAT / LOW-FAT FOOD SUBSTITUTES Meats /  Saturated Fat (g)  Avoid: Steak, marbled (3 oz/85 g) / 11 g  Choose: Steak, lean (3 oz/85 g) / 4 g  Avoid: Hamburger (3 oz/85 g) / 7 g  Choose: Hamburger, lean (3 oz/85 g) / 5 g  Avoid: Ham (3 oz/85 g) / 6 g  Choose: Ham, lean cut (3 oz/85 g) / 2.4 g  Avoid: Chicken, with skin, dark meat (3 oz/85 g) / 4 g  Choose: Chicken, skin removed, dark meat (3 oz/85 g) / 2 g  Avoid: Chicken, with skin, light meat (3 oz/85 g) / 2.5 g  Choose: Chicken, skin removed, light meat (3 oz/85 g) / 1 g Dairy / Saturated Fat (g)  Avoid: Whole milk (1 cup) / 5 g  Choose: Low-fat milk, 2% (1 cup) / 3 g  Choose: Low-fat milk, 1% (1 cup) / 1.5 g  Choose: Skim milk (1 cup) / 0.3 g  Avoid: Hard cheese (1 oz/28 g) / 6 g  Choose: Skim milk cheese (1 oz/28 g) / 2 to 3 g  Avoid: Cottage cheese, 4% fat (1 cup) / 6.5 g  Choose: Low-fat cottage cheese, 1% fat (1 cup) / 1.5 g  Avoid: Ice cream (1 cup) / 9 g  Choose: Sherbet (1 cup) / 2.5 g  Choose: Nonfat frozen yogurt (1 cup) / 0.3 g  Choose: Frozen fruit bar / trace  Avoid: Whipped cream (1 tbs) / 3.5 g  Choose: Nondairy whipped topping (1 tbs) / 1 g Condiments / Saturated Fat (g)  Avoid: Mayonnaise (1 tbs) / 2 g  Choose: Low-fat mayonnaise (1 tbs) / 1 g  Avoid: Butter (1 tbs) / 7 g  Choose: Extra light margarine (1 tbs) / 1 g  Avoid: Coconut oil (1 tbs) / 11.8 g  Choose: Olive oil (1 tbs) / 1.8 g  Choose: Corn oil (1 tbs) / 1.7 g  Choose: Safflower oil (1 tbs) / 1.2 g  Choose: Sunflower oil (1 tbs) / 1.4 g  Choose: Soybean oil (1 tbs) / 2.4 g  Choose: Canola oil (1 tbs) / 1 g Document Released: 12/11/2005 Document Revised: 04/07/2013 Document Reviewed: 03/11/2014 [employer] Patient Information 2015 [place], [place]. This information is not intended to replace advice given to you by your health care provider. Make sure you discuss any questions you have with your health care provider.  Diabetes and Foot Care Diabetes may cause  you to have problems because of poor blood supply (circulation) to your feet and legs. This may cause the skin on your feet to become thinner, break easier, and heal more slowly. Your skin may become dry, and the skin may peel and crack. You may also have nerve damage in your legs and feet causing decreased feeling in them. You may not notice minor injuries to your feet that could lead to infections or more serious problems. Taking care of your feet is one of the most important things you can do for yourself.  HOME CARE INSTRUCTIONS  Wear shoes at all times, even in the house. Do not go barefoot. Bare feet are easily injured.  Check your feet daily for blisters, cuts, and redness. If you cannot see the bottom of your feet, use a mirror or ask someone for help.  Wash your feet with warm water (do not use hot water) and mild soap. Then pat your feet and the areas between your toes until they are completely dry. Do not soak your feet as this can dry your skin.  Apply a moisturizing lotion or petroleum jelly (that does not contain alcohol and is unscented) to the skin on your feet and to dry, brittle toenails. Do not apply lotion between your toes.  Trim your toenails straight across. Do not dig under them or around the cuticle. File the edges of your nails with an emery board or nail file.  Do not cut corns or calluses or try to remove them with medicine.  Wear clean socks or stockings every day. Make sure they are not too tight. Do not wear knee-high stockings since they may decrease blood flow to your legs.  Wear shoes that fit properly and have enough cushioning. To break in new shoes, wear them for just a few hours a day. This prevents you from injuring your feet. Always look in your shoes before you put them on to be sure there are no objects inside.  Do not cross your legs. This may decrease the blood flow to your feet.  If you find a minor scrape, cut, or break in the skin on your feet, keep  it and the skin around it clean and dry. These areas may be cleansed with mild soap and water. Do not cleanse the area with peroxide, alcohol, or iodine.  When you remove an adhesive bandage, be sure not to damage the skin around it.  If you have a wound, look at it several times a day to make sure it is healing.  Do not use heating pads or hot water bottles. They may burn your skin. If you have lost feeling in your feet or legs, you may not know it is happening until it is too late.  Make sure your health care provider performs a complete foot exam at least annually or more often if you have foot problems. Report any cuts, sores, or bruises to your health care provider immediately. SEEK MEDICAL CARE IF:   You have an injury that is not healing.  You have cuts or breaks in the skin.  You have an ingrown nail.  You notice redness on your legs or feet.  You feel burning or tingling in your legs or feet.  You have pain or cramps in your legs and feet.  Your legs or feet are numb.  Your feet always feel cold. SEEK IMMEDIATE MEDICAL CARE IF:   There is increasing redness, swelling, or pain in or around a wound.  There is a red line that goes up your leg.  Pus is coming from a wound.  You develop a fever or as directed by your health care provider.  You notice a bad smell coming from an ulcer or wound. Document Released: 12/08/2000 Document Revised: 08/13/2013 Document Reviewed: 05/20/2013 [facility] Patient Information 2015 [place], [place]. This information is not intended to replace advice given to you by your health care provider. Make sure you discuss any questions you have with your health care provider.

## 2014-12-23 NOTE — Progress Notes (Signed)
Pre visit review using our clinic review tool, if applicable. No additional management support is needed unless otherwise documented below in the visit note/SLS  

## 2014-12-23 NOTE — Progress Notes (Signed)
Subjective:    Howard Fisher is a 73 y.o. male who presents for Medicare Annual/Subsequent preventive examination.   Preventive Screening-Counseling & Management  Tobacco History  Smoking status  . Never Smoker   Smokeless tobacco  . Never Used    Current Problems (verified) Patient Active Problem List   Diagnosis Date Noted  . Controlled diabetes mellitus type II without complication 77/82/4235  . Colon cancer screening 04/21/2014  . Dyspnea 06/12/2013  . Overweight 01/02/2013  . HYPERTENSION 03/25/2009  . HIATAL HERNIA 12/17/2008  . 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 HYPERCHOLESTEROLEMIA 12/16/2008  . ALLERGIC RHINITIS 12/16/2008  . GERD 12/16/2008  . BACK PAIN, LUMBAR 12/16/2008    Medications Prior to Visit Current Outpatient Prescriptions on File Prior to Visit  Medication Sig Dispense Refill  . aspirin 81 MG tablet Take 81 mg by mouth daily.     . Blood Glucose Monitoring Suppl (ACCU-CHEK AVIVA PLUS) W/DEVICE KIT Test blood sugar once daily 1 kit 0  . glucose blood (ACCU-CHEK AVIVA) test strip Use as instructed 100 each 12  . lisinopril (PRINIVIL,ZESTRIL) 10 MG tablet Take 1 tablet (10 mg total) by mouth daily. 90 tablet 3  . metFORMIN (GLUCOPHAGE-XR) 500 MG 24 hr tablet Take 1 tablet (500 mg total) by mouth daily with breakfast. 90 tablet 3  . omeprazole (PRILOSEC) 20 MG capsule Take 1 capsule (20 mg total) by mouth daily. 90 capsule 3  . simvastatin (ZOCOR) 40 MG tablet Take 1 tablet (40 mg total) by mouth at bedtime. 90 tablet 3   No current facility-administered medications on file prior to visit.    Current Medications (verified) Current Outpatient Prescriptions  Medication Sig Dispense Refill  . aspirin 81 MG tablet Take 81 mg by mouth daily.     . Blood Glucose Monitoring Suppl (ACCU-CHEK AVIVA PLUS) W/DEVICE KIT Test blood sugar once daily 1 kit 0  . glucose blood (ACCU-CHEK AVIVA) test strip Use as instructed 100 each 12  . lisinopril  (PRINIVIL,ZESTRIL) 10 MG tablet Take 1 tablet (10 mg total) by mouth daily. 90 tablet 3  . metFORMIN (GLUCOPHAGE-XR) 500 MG 24 hr tablet Take 1 tablet (500 mg total) by mouth daily with breakfast. 90 tablet 3  . omeprazole (PRILOSEC) 20 MG capsule Take 1 capsule (20 mg total) by mouth daily. 90 capsule 3  . simvastatin (ZOCOR) 40 MG tablet Take 1 tablet (40 mg total) by mouth at bedtime. 90 tablet 3   No current facility-administered medications for this visit.     Allergies (verified) Penicillins   PAST HISTORY  Family History Family History  Problem Relation Age of Onset  . Diabetes Mother     Living  . Dementia Mother   . Dementia Father     Deceased  . Diabetes Sister   . Healthy Son     x2  . Healthy Daughter     x1  . Colon cancer Neg Hx     Social History History  Substance Use Topics  . Smoking status: Never Smoker   . Smokeless tobacco: Never Used  . Alcohol Use: 0.6 oz/week    1 Glasses of wine per week    Are there smokers in your home (other than you)?  No  Risk Factors Current exercise habits: Gym/ health club routine includes stationary bike.  Dietary issues discussed: well-balanced diet.  Body mass index is 28.55 kg/(m^2).  Cardiac risk factors: advanced age (older than 39 for men, 93 for women), diabetes mellitus, dyslipidemia, family history  of premature cardiovascular disease, hypertension and male gender.  Depression Screen (Note: if answer to either of the following is "Yes", a more complete depression screening is indicated)   Q1: Over the past two weeks, have you felt down, depressed or hopeless? No  Q2: Over the past two weeks, have you felt little interest or pleasure in doing things? No  Have you lost interest or pleasure in daily life? No  Do you often feel hopeless? No  Do you cry easily over simple problems? No  Activities of Daily Living In your present state of health, do you have any difficulty performing the following activities?:   Driving? No Managing money?  No Feeding yourself? No Getting from bed to chair? No Climbing a flight of stairs? No Preparing food and eating?: No Bathing or showering? No Getting dressed: No Getting to the toilet? No Using the toilet:No Moving around from place to place: No In the past year have you fallen or had a near fall?:No   Are you sexually active?  Yes  Do you have more than one partner?  No  Hearing Difficulties: No Do you often ask people to speak up or repeat themselves? No Do you experience ringing or noises in your ears? No Do you have difficulty understanding soft or whispered voices? No   Do you feel that you have a problem with memory? No  Do you often misplace items? No  Do you feel safe at home?  Yes  Cognitive Testing  Alert? Yes  Normal Appearance?Yes  Oriented to person? Yes  Place? Yes   Time? Yes  Recall of three objects?  Yes  Can perform simple calculations? Yes  Displays appropriate judgment?Yes  Can read the correct time from a watch face?Yes   Advanced Directives have been discussed with the patient? Yes   List the Names of Other Physician/Practitioners you currently use: 1.    Indicate any recent Medical Services you may have received from other than [facility] providers in the past year (date may be approximate).  Immunization History  Administered Date(s) Administered  . Tdap 01/03/2012    Screening Tests Health Maintenance  Topic Date Due  . ZOSTAVAX  04/07/2000  . PNEUMOCOCCAL POLYSACCHARIDE VACCINE AGE 40 AND OVER  04/07/2005  . URINE MICROALBUMIN  04/18/2015  . HEMOGLOBIN A1C  06/24/2015  . INFLUENZA VACCINE  07/26/2015  . FOOT EXAM  09/02/2015  . OPHTHALMOLOGY EXAM  10/27/2015  . COLONOSCOPY  07/02/2019  . TETANUS/TDAP  01/02/2022    All answers were reviewed with the patient and necessary referrals were made:  Leeanne Rio, PA-C   12/27/2014   History reviewed: allergies, current medications, past family history,  past medical history, past social history, past surgical history and problem list  Review of Systems A comprehensive review of systems was negative.    Objective:     Vision by Snellen chart: right eye:20/25, left eye:20/30 Blood pressure 147/87, pulse 62, temperature 97.9 F (36.6 C), temperature source Oral, resp. rate 16, height '6\' 1"'  (1.854 m), weight 216 lb 6 oz (98.147 kg), SpO2 96 %. Body mass index is 28.55 kg/(m^2).  BP 147/87 mmHg  Pulse 62  Temp(Src) 97.9 F (36.6 C) (Oral)  Resp 16  Ht '6\' 1"'  (1.854 m)  Wt 216 lb 6 oz (98.147 kg)  BMI 28.55 kg/m2  SpO2 96% General appearance: alert, cooperative, appears stated age and no distress Head: Normocephalic, without obvious abnormality, atraumatic Eyes: conjunctivae/corneas clear. PERRL, EOM's intact. Fundi benign.  Ears: normal TM's and external ear canals both ears Nose: Nares normal. Septum midline. Mucosa normal. No drainage or sinus tenderness. Throat: lips, mucosa, and tongue normal; teeth and gums normal Neck: no adenopathy, no carotid bruit, no JVD, supple, symmetrical, trachea midline and thyroid not enlarged, symmetric, no tenderness/mass/nodules Lungs: clear to auscultation bilaterally Chest wall: no tenderness Heart: regular rate and rhythm, S1, S2 normal, no murmur, click, rub or gallop Abdomen: soft, non-tender; bowel sounds normal; no masses,  no organomegaly Extremities: extremities normal, atraumatic, no cyanosis or edema Pulses: 2+ and symmetric Skin: Skin color, texture, turgor normal. No rashes or lesions Lymph nodes: Cervical, supraclavicular, and axillary nodes normal. Neurologic: Alert and oriented X 3, normal strength and tone. Normal symmetric reflexes. Normal coordination and gait     Assessment:     (1) Medicare Annual Wellness, Subsequent (2) Screening for Ischemic Heart Disease (3) Prostate Cancer Screening (4) Hypertension (5) Diabetes Mellitus II, well-controlled. (6)  Hypercholesterolemia     Plan:     (1) During the course of the visit the patient was educated and counseled about appropriate screening and preventive services including:    Pneumococcal vaccine   Screening electrocardiogram  Prostate cancer screening  Colorectal cancer screening  Diabetes screening  Advanced directives: has an advanced directive - a copy HAS NOT been provided.  Zostavax discussed.  Patient Instructions (the written plan) was given to the patient.  Medicare Attestation I have personally reviewed: The patient's medical and social history Their use of alcohol, tobacco or illicit drugs Their current medications and supplements The patient's functional ability including ADLs,fall risks, home safety risks, cognitive, and hearing and visual impairment Diet and physical activities Evidence for depression or mood disorders  The patient's weight, height, BMI, and visual acuity have been recorded in the chart.  I have made referrals, counseling, and provided education to the patient based on review of the above and I have provided the patient with a written personalized care plan for preventive services.    (2) EKG reveals NSR.  Continue current  Medication regimen.  Will check fasting lipid panel today.  (3) Risks vs Benefits of screening discussed with patient.  Will proceed with PSA testing today.  (4) Well-controlled.  Continue current regimen.  81 mg ASA daily.  (5) Been doing well on QOD dosing of Metformin.  DFE performed and within normal limits (see below).  Continue current regimen.  Will update labs today.  Diabetic Foot Form - Detailed   Diabetic Foot Exam - detailed  Diabetic Foot exam was performed with the following findings:  Yes 12/27/2014  7:29 PM  Visual Foot Exam completed.:  Yes  Is there a history of foot ulcer?:  No  Can the patient see the bottom of their feet?:  Yes  Are the shoes appropriate in style and fit?:  Yes  Is there swelling  or and abnormal foot shape?:  No  Are the toenails long?:  No  Are the toenails thick?:  Yes  Do you have pain in calf while walking?:  No  Is there a claw toe deformity?:  No  Is there elevated skin temparature?:  No  Is there limited skin dorsiflexion?:  No  Is there foot or ankle muscle weakness?:  No  Are the toenails ingrown?:  No  Normal Range of Motion:  Yes    Pulse Foot Exam completed.:  Yes  Right posterior Tibialias:  Present Left posterior Tibialias:  Present  Right Dorsalis Pedis:  Present  Left Dorsalis Pedis:  Present  Sensory Foot Exam Completed.:  Yes  Swelling:  No  Semmes-Weinstein Monofilament Test  R Foot Test Control:  Neg L Foot Test Control:  Neg  R Site 1-Great Toe:  Neg L Site 1-Great Toe:  Neg  R Site 4:  Neg L Site 4:  Neg  R Site 5:  Neg L Site 5:  Neg       (6) Well-controlled.  Continue current regimen.  Will check fasting lipids today.  Raiford Noble [place], [place]   12/27/2014

## 2014-12-31 ENCOUNTER — Telehealth: Payer: Self-pay | Admitting: Physician Assistant

## 2014-12-31 NOTE — Telephone Encounter (Signed)
Caller name:Jo Matkins Relation to [id number] Call back [place]:  Reason for call: pt is requesting a copy of last lab results mailed.

## 2015-01-01 NOTE — Telephone Encounter (Signed)
Copy placed in mail/SLS

## 2015-01-18 ENCOUNTER — Telehealth: Payer: Self-pay | Admitting: Physician Assistant

## 2015-01-18 DIAGNOSIS — I10 Essential (primary) hypertension: Secondary | ICD-10-CM

## 2015-01-18 MED ORDER — LISINOPRIL 10 MG PO TABS: 10.0000 mg | ORAL_TABLET | Freq: Every day | ORAL | Status: DC

## 2015-01-18 NOTE — Telephone Encounter (Signed)
Rx request to new pharmacy; pharmacy information updated in demographics/SLS

## 2015-01-18 NOTE — Telephone Encounter (Signed)
Caller name: [place]ell Relation to pt: self Call back number: [phone] Pharmacy: [place]  Reason for call:   Patient now using [facility] pharmacy. Requesting a refill of lisinopril

## 2015-02-08 ENCOUNTER — Telehealth: Payer: Self-pay | Admitting: Physician Assistant

## 2015-02-08 MED ORDER — METFORMIN HCL ER 500 MG PO TB24: 500.0000 mg | ORAL_TABLET | Freq: Every day | ORAL | Status: DC

## 2015-02-08 MED ORDER — SIMVASTATIN 40 MG PO TABS: 40.0000 mg | ORAL_TABLET | Freq: Every day | ORAL | Status: DC

## 2015-02-08 MED ORDER — OMEPRAZOLE 20 MG PO CPDR: 20.0000 mg | DELAYED_RELEASE_CAPSULE | Freq: Every day | ORAL | Status: DC

## 2015-02-08 NOTE — Telephone Encounter (Signed)
Rx request to pharmacy/SLS  

## 2015-02-08 NOTE — Telephone Encounter (Signed)
Caller name: [place] Relation to pt: self Call back number: [phone] Pharmacy: [place]  Reason for call:   Requesting refills of metformin and simvastatin, and omeprazole to be sent. 90 day supply

## 2015-03-23 ENCOUNTER — Telehealth: Payer: Self-pay | Admitting: Physician Assistant

## 2015-03-23 NOTE — Telephone Encounter (Signed)
Caller name: Joelene Millin from [place] Relation to pt:  Call back number: [phone] Pharmacy:  Reason for call:   Patient is at [facility] right now and needs [facility] referral.  Seeing Dr. Colbert Coyer NPI# [id number] DX Code: B30.8

## 2015-03-23 NOTE — Telephone Encounter (Signed)
Human ID # [id number] Auth # [id number]

## 2015-03-31 ENCOUNTER — Ambulatory Visit (INDEPENDENT_AMBULATORY_CARE_PROVIDER_SITE_OTHER): Payer: Commercial Managed Care - HMO | Admitting: Physician Assistant

## 2015-03-31 ENCOUNTER — Encounter: Payer: Self-pay | Admitting: Physician Assistant

## 2015-03-31 VITALS — BP 128/93 | HR 72 | Temp 97.5°F | Resp 16 | Ht 73.0 in | Wt 217.0 lb

## 2015-03-31 DIAGNOSIS — M5432 Sciatica, left side: Secondary | ICD-10-CM | POA: Diagnosis not present

## 2015-03-31 MED ORDER — METHYLPREDNISOLONE (PAK) 4 MG PO TABS: ORAL_TABLET | ORAL | Status: DC

## 2015-03-31 NOTE — Progress Notes (Signed)
Patient presents to clinic today c/o left-sided low back pain 2-3 weeks, noted after an episode of heavy lifting. Patient endorses radiation of pain into left lower extremity. Denies numbness or weakness. Denies saddle anesthesia or change to bowel or bladder habits.  Past Medical History  Diagnosis Date  . Allergic rhinitis   . Sinusitis   . Dyspnea   . Hypertension   . Hypercholesteremia   . Borderline diabetes mellitus   . Hiatal hernia   . GERD (gastroesophageal reflux disease)   . Peyronie's disease   . Lumbar back pain   . Chicken pox   . Diabetes mellitus without complication   . Melanoma 2014    shoulder  . 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 History of stomach ulcers     1985    Current Outpatient Prescriptions on File Prior to Visit  Medication Sig Dispense Refill  . aspirin 81 MG tablet Take 81 mg by mouth daily.     . Blood Glucose Monitoring Suppl (ACCU-CHEK AVIVA PLUS) W/DEVICE KIT Test blood sugar once daily 1 kit 0  . glucose blood (ACCU-CHEK AVIVA) test strip Use as instructed 100 each 12  . lisinopril (PRINIVIL,ZESTRIL) 10 MG tablet Take 1 tablet (10 mg total) by mouth daily. 90 tablet 1  . metFORMIN (GLUCOPHAGE-XR) 500 MG 24 hr tablet Take 1 tablet (500 mg total) by mouth daily with breakfast. 90 tablet 1  . omeprazole (PRILOSEC) 20 MG capsule Take 1 capsule (20 mg total) by mouth daily. 90 capsule 1  . simvastatin (ZOCOR) 40 MG tablet Take 1 tablet (40 mg total) by mouth at bedtime. 90 tablet 1   No current facility-administered medications on file prior to visit.    Allergies  Allergen Reactions  . Penicillins     REACTION: rash and itching    Family History  Problem Relation Age of Onset  . Diabetes Mother     Living  . Dementia Mother   . Dementia Father     Deceased  . Diabetes Sister   . Healthy Son     x2  . Healthy Daughter     x1  . Colon cancer Neg Hx     History   Social History  . Marital Status: Married    Spouse  Name: N/A  . Number of Children: 1  . 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 Smoking status: Never Smoker   . Smokeless tobacco: Never Used  . Alcohol Use: 0.6 oz/week    1 Glasses of wine per week  . Drug Use: No  . Sexual Activity: Not on file   Other Topics Concern  . None   Social History Narrative   Review of Systems - See HPI.  All other ROS are negative.  BP 128/93 mmHg  Pulse 72  Temp(Src) 97.5 F (36.4 C) (Oral)  Resp 16  Ht '6\' 1"'  (1.854 m)  Wt 217 lb (98.431 kg)  BMI 28.64 kg/m2  SpO2 96%  Physical Exam  Constitutional: He is oriented to person, place, and time and well-developed, well-nourished, and in no distress.  HENT:  Head: Normocephalic and atraumatic.  Cardiovascular: Normal rate, regular rhythm, normal heart sounds and intact distal pulses.   Pulmonary/Chest: Effort normal and breath sounds normal. No respiratory distress. He has no wheezes. He has no rales. He exhibits no tenderness.  Musculoskeletal:  Lumbar back: He exhibits pain. He exhibits no tenderness and no bony tenderness.  Negative straight leg raise test. Hip malalignment noted.  Neurological: He is alert and oriented to person, place, and time.  Skin: Skin is warm and dry. No rash noted.  Psychiatric: Affect normal.  Vitals reviewed.  Assessment/Plan: Sciatica Rx Medrol Dosepak. Supportive measures discussed with patient. Avoid heavy lifting or over exertion. Apply topical Aspercreme to the area. Stretching exercises discussed. Handout given. Will refer to physical therapy for assessment after acute flare has resolved.

## 2015-03-31 NOTE — Patient Instructions (Signed)
Please take the Medrol pack as directed. Avoid heavy lifting or overexertion. Please start the exercises below as symptoms are resolving. You will be contacted by Physical Therapy for evaluation.  Sciatica with Rehab The sciatic nerve runs from the back down the leg and is responsible for sensation and control of the muscles in the back (posterior) side of the thigh, lower leg, and foot. Sciatica is a condition that is characterized by inflammation of this nerve.  SYMPTOMS   Signs of nerve damage, including numbness and/or weakness along the posterior side of the lower extremity.  Pain in the back of the thigh that may also travel down the leg.  Pain that worsens when sitting for long periods of time.  Occasionally, pain in the back or buttock. CAUSES  Inflammation of the sciatic nerve is the cause of sciatica. The inflammation is due to something irritating the nerve. Common sources of irritation include:  Sitting for long periods of time.  Direct trauma to the nerve.  Arthritis of the spine.  Herniated or ruptured disk.  Slipping of the vertebrae (spondylolisthesis).  Pressure from soft tissues, such as muscles or ligament-like tissue (fascia). RISK INCREASES WITH:  Sports that place pressure or stress on the spine (football or weightlifting).  Poor strength and flexibility.  Failure to warm up properly before activity.  Family history of low back pain or disk disorders.  Previous back injury or surgery.  Poor body mechanics, especially when lifting, or poor posture. PREVENTION   Warm up and stretch properly before activity.  Maintain physical fitness:  Strength, flexibility, and endurance.  Cardiovascular fitness.  Learn and use proper technique, especially with posture and lifting. When possible, have coach correct improper technique.  Avoid activities that place stress on the spine. PROGNOSIS If treated properly, then sciatica usually resolves within 6  weeks. However, occasionally surgery is necessary.  RELATED COMPLICATIONS   Permanent nerve damage, including pain, numbness, tingle, or weakness.  Chronic back pain.  Risks of surgery: infection, bleeding, nerve damage, or damage to surrounding tissues. TREATMENT Treatment initially involves resting from any activities that aggravate your symptoms. The use of ice and medication may help reduce pain and inflammation. The use of strengthening and stretching exercises may help reduce pain with activity. These exercises may be performed at home or with referral to a therapist. A therapist may recommend further treatments, such as transcutaneous electronic nerve stimulation (TENS) or ultrasound. Your caregiver may recommend corticosteroid injections to help reduce inflammation of the sciatic nerve. If symptoms persist despite non-surgical (conservative) treatment, then surgery may be recommended. MEDICATION  If pain medication is necessary, then nonsteroidal anti-inflammatory medications, such as aspirin and ibuprofen, or other minor pain relievers, such as acetaminophen, are often recommended.  Do not take pain medication for 7 days before surgery.  Prescription pain relievers may be given if deemed necessary by your caregiver. Use only as directed and only as much as you need.  Ointments applied to the skin may be helpful.  Corticosteroid injections may be given by your caregiver. These injections should be reserved for the most serious cases, because they may only be given a certain number of times. HEAT AND COLD  Cold treatment (icing) relieves pain and reduces inflammation. Cold treatment should be applied for 10 to 15 minutes every 2 to 3 hours for inflammation and pain and immediately after any activity that aggravates your symptoms. Use ice packs or massage the area with a piece of ice (ice massage).  Heat treatment  may be used prior to performing the stretching and strengthening  activities prescribed by your caregiver, physical therapist, or athletic trainer. Use a heat pack or soak the injury in warm water. SEEK MEDICAL CARE IF:  Treatment seems to offer no benefit, or the condition worsens.  Any medications produce adverse side effects. EXERCISES  RANGE OF MOTION (ROM) AND STRETCHING EXERCISES - Sciatica Most people with sciatic will find that their symptoms worsen with either excessive bending forward (flexion) or arching at the low back (extension). The exercises which will help resolve your symptoms will focus on the opposite motion. Your physician, physical therapist or athletic trainer will help you determine which exercises will be most helpful to resolve your low back pain. Do not complete any exercises without first consulting with your clinician. Discontinue any exercises which worsen your symptoms until you speak to your clinician. If you have pain, numbness or tingling which travels down into your buttocks, leg or foot, the goal of the therapy is for these symptoms to move closer to your back and eventually resolve. Occasionally, these leg symptoms will get better, but your low back pain may worsen; this is typically an indication of progress in your rehabilitation. Be certain to be very alert to any changes in your symptoms and the activities in which you participated in the 24 hours prior to the change. Sharing this information with your clinician will allow him/her to most efficiently treat your condition. These exercises may help you when beginning to rehabilitate your injury. Your symptoms may resolve with or without further involvement from your physician, physical therapist or athletic trainer. While completing these exercises, remember:   Restoring tissue flexibility helps normal motion to return to the joints. This allows healthier, less painful movement and activity.  An effective stretch should be held for at least 30 seconds.  A stretch should never  be painful. You should only feel a gentle lengthening or release in the stretched tissue. FLEXION RANGE OF MOTION AND STRETCHING EXERCISES: STRETCH - Flexion, Single Knee to Chest   Lie on a firm bed or floor with both legs extended in front of you.  Keeping one leg in contact with the floor, bring your opposite knee to your chest. Hold your leg in place by either grabbing behind your thigh or at your knee.  Pull until you feel a gentle stretch in your low back. Hold __________ seconds.  Slowly release your grasp and repeat the exercise with the opposite side. Repeat __________ times. Complete this exercise __________ times per day.  STRETCH - Flexion, Double Knee to Chest  Lie on a firm bed or floor with both legs extended in front of you.  Keeping one leg in contact with the floor, bring your opposite knee to your chest.  Tense your stomach muscles to support your back and then lift your other knee to your chest. Hold your legs in place by either grabbing behind your thighs or at your knees.  Pull both knees toward your chest until you feel a gentle stretch in your low back. Hold __________ seconds.  Tense your stomach muscles and slowly return one leg at a time to the floor. Repeat __________ times. Complete this exercise __________ times per day.  STRETCH - Low Trunk Rotation   Lie on a firm bed or floor. Keeping your legs in front of you, bend your knees so they are both pointed toward the ceiling and your feet are flat on the floor.  Extend your arms  out to the side. This will stabilize your upper body by keeping your shoulders in contact with the floor.  Gently and slowly drop both knees together to one side until you feel a gentle stretch in your low back. Hold for __________ seconds.  Tense your stomach muscles to support your low back as you bring your knees back to the starting position. Repeat the exercise to the other side. Repeat __________ times. Complete this exercise  __________ times per day  EXTENSION RANGE OF MOTION AND FLEXIBILITY EXERCISES: STRETCH - Extension, Prone on Elbows  Lie on your stomach on the floor, a bed will be too soft. Place your palms about shoulder width apart and at the height of your head.  Place your elbows under your shoulders. If this is too painful, stack pillows under your chest.  Allow your body to relax so that your hips drop lower and make contact more completely with the floor.  Hold this position for __________ seconds.  Slowly return to lying flat on the floor. Repeat __________ times. Complete this exercise __________ times per day.  RANGE OF MOTION - Extension, Prone Press Ups  Lie on your stomach on the floor, a bed will be too soft. Place your palms about shoulder width apart and at the height of your head.  Keeping your back as relaxed as possible, slowly straighten your elbows while keeping your hips on the floor. You may adjust the placement of your hands to maximize your comfort. As you gain motion, your hands will come more underneath your shoulders.  Hold this position __________ seconds.  Slowly return to lying flat on the floor. Repeat __________ times. Complete this exercise __________ times per day.  STRENGTHENING EXERCISES - Sciatica  These exercises may help you when beginning to rehabilitate your injury. These exercises should be done near your "sweet spot." This is the neutral, low-back arch, somewhere between fully rounded and fully arched, that is your least painful position. When performed in this safe range of motion, these exercises can be used for people who have either a flexion or extension based injury. These exercises may resolve your symptoms with or without further involvement from your physician, physical therapist or athletic trainer. While completing these exercises, remember:   Muscles can gain both the endurance and the strength needed for everyday activities through controlled  exercises.  Complete these exercises as instructed by your physician, physical therapist or athletic trainer. Progress with the resistance and repetition exercises only as your caregiver advises.  You may experience muscle soreness or fatigue, but the pain or discomfort you are trying to eliminate should never worsen during these exercises. If this pain does worsen, stop and make certain you are following the directions exactly. If the pain is still present after adjustments, discontinue the exercise until you can discuss the trouble with your clinician. STRENGTHENING - Deep Abdominals, Pelvic Tilt   Lie on a firm bed or floor. Keeping your legs in front of you, bend your knees so they are both pointed toward the ceiling and your feet are flat on the floor.  Tense your lower abdominal muscles to press your low back into the floor. This motion will rotate your pelvis so that your tail bone is scooping upwards rather than pointing at your feet or into the floor.  With a gentle tension and even breathing, hold this position for __________ seconds. Repeat __________ times. Complete this exercise __________ times per day.  STRENGTHENING - Abdominals, Crunches   Lie  on a firm bed or floor. Keeping your legs in front of you, bend your knees so they are both pointed toward the ceiling and your feet are flat on the floor. Cross your arms over your chest.  Slightly tip your chin down without bending your neck.  Tense your abdominals and slowly lift your trunk high enough to just clear your shoulder blades. Lifting higher can put excessive stress on the low back and does not further strengthen your abdominal muscles.  Control your return to the starting position. Repeat __________ times. Complete this exercise __________ times per day.  STRENGTHENING - Quadruped, Opposite UE/LE Lift  Assume a hands and knees position on a firm surface. Keep your hands under your shoulders and your knees under your  hips. You may place padding under your knees for comfort.  Find your neutral spine and gently tense your abdominal muscles so that you can maintain this position. Your shoulders and hips should form a rectangle that is parallel with the floor and is not twisted.  Keeping your trunk steady, lift your right hand no higher than your shoulder and then your left leg no higher than your hip. Make sure you are not holding your breath. Hold this position __________ seconds.  Continuing to keep your abdominal muscles tense and your back steady, slowly return to your starting position. Repeat with the opposite arm and leg. Repeat __________ times. Complete this exercise __________ times per day.  STRENGTHENING - Abdominals and Quadriceps, Straight Leg Raise   Lie on a firm bed or floor with both legs extended in front of you.  Keeping one leg in contact with the floor, bend the other knee so that your foot can rest flat on the floor.  Find your neutral spine, and tense your abdominal muscles to maintain your spinal position throughout the exercise.  Slowly lift your straight leg off the floor about 6 inches for a count of 15, making sure to not hold your breath.  Still keeping your neutral spine, slowly lower your leg all the way to the floor. Repeat this exercise with each leg __________ times. Complete this exercise __________ times per day. POSTURE AND BODY MECHANICS CONSIDERATIONS - Sciatica Keeping correct posture when sitting, standing or completing your activities will reduce the stress put on different body tissues, allowing injured tissues a chance to heal and limiting painful experiences. The following are general guidelines for improved posture. Your physician or physical therapist will provide you with any instructions specific to your needs. While reading these guidelines, remember:  The exercises prescribed by your provider will help you have the flexibility and strength to maintain  correct postures.  The correct posture provides the optimal environment for your joints to work. All of your joints have less wear and tear when properly supported by a spine with good posture. This means you will experience a healthier, less painful body.  Correct posture must be practiced with all of your activities, especially prolonged sitting and standing. Correct posture is as important when doing repetitive low-stress activities (typing) as it is when doing a single heavy-load activity (lifting). RESTING POSITIONS Consider which positions are most painful for you when choosing a resting position. If you have pain with flexion-based activities (sitting, bending, stooping, squatting), choose a position that allows you to rest in a less flexed posture. You would want to avoid curling into a fetal position on your side. If your pain worsens with extension-based activities (prolonged standing, working overhead), avoid resting in  an extended position such as sleeping on your stomach. Most people will find more comfort when they rest with their spine in a more neutral position, neither too rounded nor too arched. Lying on a non-sagging bed on your side with a pillow between your knees, or on your back with a pillow under your knees will often provide some relief. Keep in mind, being in any one position for a prolonged period of time, no matter how correct your posture, can still lead to stiffness. PROPER SITTING POSTURE In order to minimize stress and discomfort on your spine, you must sit with correct posture Sitting with good posture should be effortless for a healthy body. Returning to good posture is a gradual process. Many people can work toward this most comfortably by using various supports until they have the flexibility and strength to maintain this posture on their own. When sitting with proper posture, your ears will fall over your shoulders and your shoulders will fall over your hips. You should  use the back of the chair to support your upper back. Your low back will be in a neutral position, just slightly arched. You may place a small pillow or folded towel at the base of your low back for support.  When working at a desk, create an environment that supports good, upright posture. Without extra support, muscles fatigue and lead to excessive strain on joints and other tissues. Keep these recommendations in mind: CHAIR:   A chair should be able to slide under your desk when your back makes contact with the back of the chair. This allows you to work closely.  The chair's height should allow your eyes to be level with the upper part of your monitor and your hands to be slightly lower than your elbows. BODY POSITION  Your feet should make contact with the floor. If this is not possible, use a foot rest.  Keep your ears over your shoulders. This will reduce stress on your neck and low back. INCORRECT SITTING POSTURES   If you are feeling tired and unable to assume a healthy sitting posture, do not slouch or slump. This puts excessive strain on your back tissues, causing more damage and pain. Healthier options include:  Using more support, like a lumbar pillow.  Switching tasks to something that requires you to be upright or walking.  Talking a brief walk.  Lying down to rest in a neutral-spine position. PROLONGED STANDING WHILE SLIGHTLY LEANING FORWARD  When completing a task that requires you to lean forward while standing in one place for a long time, place either foot up on a stationary 2-4 inch high object to help maintain the best posture. When both feet are on the ground, the low back tends to lose its slight inward curve. If this curve flattens (or becomes too large), then the back and your other joints will experience too much stress, fatigue more quickly and can cause pain.  CORRECT STANDING POSTURES Proper standing posture should be assumed with all daily activities, even if  they only take a few moments, like when brushing your teeth. As in sitting, your ears should fall over your shoulders and your shoulders should fall over your hips. You should keep a slight tension in your abdominal muscles to brace your spine. Your tailbone should point down to the ground, not behind your body, resulting in an over-extended swayback posture.  INCORRECT STANDING POSTURES  Common incorrect standing postures include a forward head, locked knees and/or an excessive  swayback. WALKING Walk with an upright posture. Your ears, shoulders and hips should all line-up. PROLONGED ACTIVITY IN A FLEXED POSITION When completing a task that requires you to bend forward at your waist or lean over a low surface, try to find a way to stabilize 3 of 4 of your limbs. You can place a hand or elbow on your thigh or rest a knee on the surface you are reaching across. This will provide you more stability so that your muscles do not fatigue as quickly. By keeping your knees relaxed, or slightly bent, you will also reduce stress across your low back. CORRECT LIFTING TECHNIQUES DO :   Assume a wide stance. This will provide you more stability and the opportunity to get as close as possible to the object which you are lifting.  Tense your abdominals to brace your spine; then bend at the knees and hips. Keeping your back locked in a neutral-spine position, lift using your leg muscles. Lift with your legs, keeping your back straight.  Test the weight of unknown objects before attempting to lift them.  Try to keep your elbows locked down at your sides in order get the best strength from your shoulders when carrying an object.  Always ask for help when lifting heavy or awkward objects. INCORRECT LIFTING TECHNIQUES DO NOT:   Lock your knees when lifting, even if it is a small object.  Bend and twist. Pivot at your feet or move your feet when needing to change directions.  Assume that you cannot safely pick  up a paperclip without proper posture. Document Released: 12/11/2005 Document Revised: 04/27/2014 Document Reviewed: 03/25/2009 [facility] Patient Information 2015 [place], [place]. This information is not intended to replace advice given to you by your health care provider. Make sure you discuss any questions you have with your health care provider.

## 2015-03-31 NOTE — Progress Notes (Signed)
Pre visit review using our clinic review tool, if applicable. No additional management support is needed unless otherwise documented below in the visit note/SLS  

## 2015-04-01 DIAGNOSIS — M5430 Sciatica, unspecified side: Secondary | ICD-10-CM | POA: Insufficient documentation

## 2015-04-01 NOTE — Assessment & Plan Note (Signed)
Rx Medrol Dosepak. Supportive measures discussed with patient. Avoid heavy lifting or over exertion. Apply topical Aspercreme to the area. Stretching exercises discussed. Handout given. Will refer to physical therapy for assessment after acute flare has resolved.

## 2015-04-20 ENCOUNTER — Encounter: Payer: Self-pay | Admitting: Physical Therapy

## 2015-04-20 ENCOUNTER — Ambulatory Visit: Payer: Commercial Managed Care - HMO | Attending: Physician Assistant | Admitting: Physical Therapy

## 2015-04-20 DIAGNOSIS — M545 Low back pain, unspecified: Secondary | ICD-10-CM

## 2015-04-20 NOTE — Therapy (Addendum)
[facility] [address]  [place], [place], [address] Phone: [phone]   Fax:  [phone]  Physical Therapy Evaluation  Patient Details  Name: Howard Fisher MRN: [id number] Date of Birth: 04-15-1941 Referring Provider:  Brunetta Jeans, PA-C  Encounter Date: 04/20/2015      PT End of Session - 04/20/15 1025    Visit Number 1   Number of Visits 12   Date for PT Re-Evaluation 06/01/15   PT Start Time 0935   PT Stop Time 1025   PT Time Calculation (min) 50 min      Past Medical History  Diagnosis Date  . Allergic rhinitis   . Sinusitis   . Dyspnea   . Hypertension   . Hypercholesteremia   . Borderline diabetes mellitus   . Hiatal hernia   . GERD (gastroesophageal reflux disease)   . Peyronie's disease   . Lumbar back pain   . Chicken pox   . Diabetes mellitus without complication   . Melanoma 2014    shoulder  . 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 History of stomach ulcers     1985    Past Surgical History  Procedure Laterality Date  . Cholecystectomy  02/1993    Dr. Harlow Asa  . Right hernia repair  2002    Dr. Deon Pilling  . Cataract extraction Bilateral 11/2009    Dr. Charise Killian  . Basal cell removed from face  12/2012  . Skin cancer removed from right shoulder  11/2013    melanoma    There were no vitals filed for this visit.  Visit Diagnosis:  Bilateral low back pain without sciatica - Plan: PT plan of care cert/re-cert      Subjective Assessment - 04/20/15 0938    Subjective Pt with history of chronic LBP over the past 27 years per pt report.  He states pain seems to be getting worse in that notes L hip and L lower back pain and spasms upon waking in the AM over the past couple months.  Also notes L hip/buttock pain with sit-stand transfer after sitting for a while.  States he sleeps pretty well no limited by pain.  Is able to play 18 holes golf about 2x/month but states notes increased  following this (once he returns home signifcant increased pain).  Denies N/T or BB issues.   Patient Stated Goals "get my spine straightened out"   Currently in Pain? Yes   Pain Score --  rates AVG pain with activity 4-5/10 and worst pain up to 7-8/10.   Pain Location Back   Pain Orientation Left;Lower   Pain Radiating Towards into L buttock and lateral hip   Pain Onset More than a month ago   Pain Frequency Intermittent   Aggravating Factors  sit->stand transfers after prolonged sitting, trunk flexion   Pain Relieving Factors movement   Effect of Pain on Daily Activities is able to perform ADLs but difficulty with chores and working the yard especially if requires trunk flexion   Multiple Pain Sites No            [facility] PT Assessment - 04/20/15 0001    Assessment   Medical Diagnosis Sciatica Left   Onset Date 01/25/15   Balance Screen   Has the patient fallen in the past 6 months No   Has the patient had a decrease in activity level because of a fear of falling?  No  Is the patient reluctant to leave their home because of a fear of falling?  No   Home Environment   Living Enviornment Private residence   Living Arrangements Spouse/significant other   Type of [place] Two level  states has no trouble with stairs   Prior Function   Vocation Retired   Leisure enjoys golfing but only plays 2x/month currently; was exercising 3x/wk but stopped due to fear this caused increased LBP.   Observation/Other Assessments   Focus on Therapeutic Outcomes (FOTO)  41% Limitation   Functional Tests   Functional tests Squat   Squat   Comments fw wt shift but does keep heels on ground, is able to achieve nearly parallel with thighs but displays excessive trunk flexion   Posture/Postural Control   Posture Comments in standing L pelvis high vs R causing R SB posture in L-spine with compensatory L SB in T/L area; mild sway back posture with shoulders posterior to hips   ROM /  Strength   AROM / PROM / Strength AROM;Strength   AROM   AROM Assessment Site Lumbar   Lumbar Flexion hands to ankles, notes LBP with return from flexion   Lumbar Extension 75% normal, no pain   Strength   Overall Strength Comments B Knees 5/5 without pain   Strength Assessment Site Hip   Right/Left Hip Right;Left   Right Hip Flexion 5/5   Right Hip Extension 4/5   Right Hip External Rotation  --  5/5   Right Hip Internal Rotation  --  5/5   Right Hip ABduction 4+/5   Right Hip ADduction 5/5   Left Hip Flexion 5/5   Left Hip Extension 4/5   Left Hip External Rotation  --  5/5   Left Hip Internal Rotation  --  5/5   Left Hip ABduction 4+/5   Left Hip ADduction 5/5   Flexibility   Soft Tissue Assessment /Muscle Length yes   Hamstrings L tight (60 dg SLR), R SLR to 80+dg   Quadriceps B RF tightness and LBP with Prone Knee Flexion   Piriformis B Tightness   Special Tests    Special Tests Lumbar;Hip Special Tests   Hip Special Tests  Thomas Test;Hip Scouring   FABER test   findings Negative   Slump test   Findings Negative   Prone Knee Bend Test   Findings Positive   Side Right   Comment both tight, R created LBP   Straight Leg Raise   Findings Negative   Comment no LBP but L much tighter than R   Thomas Test    Findings Negative   Hip Scouring   Findings Negative      TODAY'S TREATMENT: TherEx - instruct and perform HEP (see HEP)           PT Education - 04/20/15 1028    Education provided Yes   Education Details initial HEP   Person(s) Educated Patient   Methods Explanation;Demonstration;Handout   Comprehension Verbalized understanding;Returned demonstration          PT Short Term Goals - 04/20/15 1157    PT SHORT TERM GOAL #1   Title pt independent with initial hep by 04/27/15   Status New           PT Long Term Goals - 04/20/15 1157    PT LONG TERM GOAL #1   Title pt returns to regular exercise without restriction by LBP by 06/01/15    Status New  PT LONG TERM GOAL #2   Title B hip MMT 4+/5 or better without pain by 06/01/15   Status New   PT LONG TERM GOAL #3   Title pt able to perform chores and yardwork without c/o LBP greater than 2/10 by 06/01/15   Status New               Plan - 05-02-2015 1148    Clinical Impression Statement Pt with c/o LBP which is primariliy noted to L side.  He displays significant pelvic assymetry but this does not appear to be due to true leg length discrepency and this assymetry may be contributing to his pain.  Initial focus will be on pelvic mobility and stability to see if this alters his alignment or symptoms and will ultimatly progress to more advanced lumbopelvic stability and functional mobility / body mechanics training.   Pt will benefit from skilled therapeutic intervention in order to improve on the following deficits Pain;Impaired flexibility;Improper body mechanics;Postural dysfunction;Decreased strength   Rehab Potential Good   PT Frequency 2x / week   PT Duration 6 weeks   PT Treatment/Interventions Therapeutic exercise;Manual techniques;Therapeutic activities;Patient/family education;ADLs/Self Care Home Management;Electrical Stimulation;Balance training;Traction;Moist Heat;Cryotherapy   PT Next Visit Plan re-assess pelvic alignment, progress hip and pelvic stability as able, stretch B piri and prone knee flexion   Consulted and Agree with Plan of Care Patient          G-Codes - 05/02/2015 1158    Functional Assessment Tool Used FOTO 41% limitation   Functional Limitation Mobility: Walking and moving around   Mobility: Walking and Moving Around Current Status [phone]) At least 40 percent but less than 60 percent impaired, limited or restricted   Mobility: Walking and Moving Around Goal Status [phone]) At least 20 percent but less than 40 percent impaired, limited or restricted       Problem List Patient Active Problem List   Diagnosis Date Noted  . Sciatica 04/01/2015   . Controlled diabetes mellitus type II without complication 60/60/0459  . Colon cancer screening 04/21/2014  . Dyspnea 06/12/2013  . Overweight(278.02) 01/02/2013  . HYPERTENSION 03/25/2009  . HIATAL HERNIA 12/17/2008  . 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 HYPERCHOLESTEROLEMIA 12/16/2008  . ALLERGIC RHINITIS 12/16/2008  . GERD 12/16/2008  . BACK PAIN, LUMBAR 12/16/2008    Ahmaya Ostermiller PT, OCS 2015/05/02, 12:01 PM  [facility] [address]  [place], [place], [address] Phone: [phone]   Fax:  [phone]    PHYSICAL THERAPY DISCHARGE SUMMARY  Visits from Start of Care: 1  Current functional level related to goals / functional outcomes: 41% limitation per FOTO   Remaining deficits: No Change   Education / Equipment: Initial HEP Plan: Patient agrees to discharge.  Patient goals were not met. Patient is being discharged due to the patient's request.  ?????       Mr. Piatek called yesterday cancelling al scheduled appointments stating he will not be attending future PT treatments due to "inability to afford".  We are therefore discharging him from our care at this time.  Leonette Most PT, OCS 04/22/2015 9:20 AM

## 2015-04-22 ENCOUNTER — Ambulatory Visit: Payer: Commercial Managed Care - HMO | Admitting: Physical Therapy

## 2015-04-27 ENCOUNTER — Ambulatory Visit: Payer: Commercial Managed Care - HMO | Admitting: Rehabilitation

## 2015-04-29 ENCOUNTER — Ambulatory Visit: Payer: Commercial Managed Care - HMO | Admitting: Physical Therapy

## 2015-05-04 ENCOUNTER — Ambulatory Visit: Payer: Commercial Managed Care - HMO | Admitting: Rehabilitation

## 2015-05-06 ENCOUNTER — Ambulatory Visit: Payer: Commercial Managed Care - HMO | Admitting: Rehabilitation

## 2015-05-11 ENCOUNTER — Ambulatory Visit: Payer: Commercial Managed Care - HMO | Admitting: Physical Therapy

## 2015-05-13 ENCOUNTER — Ambulatory Visit: Payer: Commercial Managed Care - HMO | Admitting: Rehabilitation

## 2015-05-18 ENCOUNTER — Ambulatory Visit: Payer: Commercial Managed Care - HMO | Admitting: Rehabilitation

## 2015-05-20 ENCOUNTER — Ambulatory Visit: Payer: Commercial Managed Care - HMO | Admitting: Physical Therapy

## 2015-06-08 ENCOUNTER — Emergency Department (HOSPITAL_BASED_OUTPATIENT_CLINIC_OR_DEPARTMENT_OTHER)
Admission: EM | Admit: 2015-06-08 | Discharge: 2015-06-09 | Disposition: A | Discharge status: Home / Self Care | Payer: Commercial Managed Care - HMO | Attending: Emergency Medicine | Admitting: Emergency Medicine

## 2015-06-08 ENCOUNTER — Encounter (HOSPITAL_BASED_OUTPATIENT_CLINIC_OR_DEPARTMENT_OTHER): Payer: Self-pay | Admitting: *Deleted

## 2015-06-08 ENCOUNTER — Emergency Department (HOSPITAL_BASED_OUTPATIENT_CLINIC_OR_DEPARTMENT_OTHER): Payer: Commercial Managed Care - HMO

## 2015-06-08 DIAGNOSIS — Z880 Allergy status to penicillin: Secondary | ICD-10-CM | POA: Diagnosis not present

## 2015-06-08 DIAGNOSIS — S0031XA Abrasion of nose, initial encounter: Secondary | ICD-10-CM

## 2015-06-08 DIAGNOSIS — S0181XA Laceration without foreign body of other part of head, initial encounter: Secondary | ICD-10-CM | POA: Insufficient documentation

## 2015-06-08 DIAGNOSIS — E119 Type 2 diabetes mellitus without complications: Secondary | ICD-10-CM | POA: Insufficient documentation

## 2015-06-08 DIAGNOSIS — Y9389 Activity, other specified: Secondary | ICD-10-CM | POA: Insufficient documentation

## 2015-06-08 DIAGNOSIS — S60512A Abrasion of left hand, initial encounter: Secondary | ICD-10-CM | POA: Insufficient documentation

## 2015-06-08 DIAGNOSIS — Z85820 Personal history of malignant melanoma of skin: Secondary | ICD-10-CM | POA: Insufficient documentation

## 2015-06-08 DIAGNOSIS — S40011A Contusion of right shoulder, initial encounter: Secondary | ICD-10-CM | POA: Diagnosis not present

## 2015-06-08 DIAGNOSIS — K219 Gastro-esophageal reflux disease without esophagitis: Secondary | ICD-10-CM | POA: Diagnosis not present

## 2015-06-08 DIAGNOSIS — Y998 Other external cause status: Secondary | ICD-10-CM | POA: Insufficient documentation

## 2015-06-08 DIAGNOSIS — Z85828 Personal history of other malignant neoplasm of skin: Secondary | ICD-10-CM | POA: Insufficient documentation

## 2015-06-08 DIAGNOSIS — S0081XA Abrasion of other part of head, initial encounter: Secondary | ICD-10-CM

## 2015-06-08 DIAGNOSIS — Z79899 Other long term (current) drug therapy: Secondary | ICD-10-CM | POA: Diagnosis not present

## 2015-06-08 DIAGNOSIS — Y92007 Garden or yard of unspecified non-institutional (private) residence as the place of occurrence of the external cause: Secondary | ICD-10-CM | POA: Diagnosis not present

## 2015-06-08 DIAGNOSIS — S0191XA Laceration without foreign body of unspecified part of head, initial encounter: Secondary | ICD-10-CM

## 2015-06-08 DIAGNOSIS — S60511A Abrasion of right hand, initial encounter: Secondary | ICD-10-CM | POA: Insufficient documentation

## 2015-06-08 DIAGNOSIS — W010XXA Fall on same level from slipping, tripping and stumbling without subsequent striking against object, initial encounter: Secondary | ICD-10-CM | POA: Diagnosis not present

## 2015-06-08 DIAGNOSIS — S80211A Abrasion, right knee, initial encounter: Secondary | ICD-10-CM | POA: Diagnosis not present

## 2015-06-08 DIAGNOSIS — S01111A Laceration without foreign body of right eyelid and periocular area, initial encounter: Secondary | ICD-10-CM | POA: Diagnosis not present

## 2015-06-08 DIAGNOSIS — Z8619 Personal history of other infectious and parasitic diseases: Secondary | ICD-10-CM | POA: Diagnosis not present

## 2015-06-08 DIAGNOSIS — S0990XA Unspecified injury of head, initial encounter: Secondary | ICD-10-CM | POA: Diagnosis present

## 2015-06-08 DIAGNOSIS — E780 Pure hypercholesterolemia: Secondary | ICD-10-CM | POA: Insufficient documentation

## 2015-06-08 DIAGNOSIS — I10 Essential (primary) hypertension: Secondary | ICD-10-CM | POA: Insufficient documentation

## 2015-06-08 DIAGNOSIS — Z7982 Long term (current) use of aspirin: Secondary | ICD-10-CM | POA: Diagnosis not present

## 2015-06-08 DIAGNOSIS — S80212A Abrasion, left knee, initial encounter: Secondary | ICD-10-CM | POA: Diagnosis not present

## 2015-06-08 NOTE — ED Notes (Addendum)
Pt continues to wait for edp eval.

## 2015-06-08 NOTE — ED Notes (Addendum)
Pt c/o fall from standing landing in garden on bricks lacerations to face and abrasions to hands and knees , also c/o right shoulder injury

## 2015-06-08 NOTE — ED Notes (Signed)
MD at bedside. 

## 2015-06-08 NOTE — ED Provider Notes (Signed)
CSN: [id number]     Arrival date & time 06/08/15  2109 History  This chart was scribed for Delora Fuel, MD by Julien Nordmann, ED Scribe. This patient was seen in room MH01/MH01 and the patient's care was started at 11:37 PM.    Chief Complaint  Patient presents with  . Fall     The history is provided by the patient. No language interpreter was used.    HPI Comments: Howard Fisher is a 74 y.o. male who presents to the Emergency Department complaining of a fall that occurred this afternoon. Pt has associated right shoulder pain and lacerations on his right eye. He rates his current pain as 5/10. Pt was watering the lawn when he became stuck in water hose and fell face flat on some bricks. He denies LOC. He notes he takes 81 mg of aspirin every other day. Pt note his last tetanus shot was one year ago. He denies neck pain and back pain.  Past Medical History  Diagnosis Date  . Allergic rhinitis   . Sinusitis   . Dyspnea   . Hypertension   . Hypercholesteremia   . Borderline diabetes mellitus   . Hiatal hernia   . GERD (gastroesophageal reflux disease)   . Peyronie's disease   . Lumbar back pain   . Chicken pox   . Diabetes mellitus without complication   . Melanoma 2014    shoulder  . 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 History of stomach ulcers     1985   Past Surgical History  Procedure Laterality Date  . Cholecystectomy  02/1993    Dr. Harlow Asa  . Right hernia repair  2002    Dr. Deon Pilling  . Cataract extraction Bilateral 11/2009    Dr. Charise Killian  . Basal cell removed from face  12/2012  . Skin cancer removed from right shoulder  11/2013    melanoma   Family History  Problem Relation Age of Onset  . Diabetes Mother     Living  . Dementia Mother   . Dementia Father     Deceased  . Diabetes Sister   . Healthy Son     x2  . Healthy Daughter     x1  . Colon cancer Neg Hx    History  Substance Use Topics  . Smoking status: Never Smoker   . Smokeless tobacco: Never  Used  . Alcohol Use: 0.6 oz/week    1 Glasses of wine per week    Review of Systems  Musculoskeletal: Positive for arthralgias. Negative for back pain and neck pain.  Skin: Positive for wound.  All other systems reviewed and are negative.     Allergies  Penicillins  Home Medications   Prior to Admission medications   Medication Sig Start Date End Date Taking? Authorizing Provider  aspirin 81 MG tablet Take 81 mg by mouth daily.     Historical Provider, MD  Blood Glucose Monitoring Suppl (ACCU-CHEK AVIVA PLUS) W/DEVICE KIT Test blood sugar once daily 02/16/14   Noralee Space, MD  glucose blood (ACCU-CHEK AVIVA) test strip Use as instructed 02/16/14   Noralee Space, MD  lisinopril (PRINIVIL,ZESTRIL) 10 MG tablet Take 1 tablet (10 mg total) by mouth daily. 01/18/15   Brunetta Jeans, PA-C  metFORMIN (GLUCOPHAGE-XR) 500 MG 24 hr tablet Take 1 tablet (500 mg total) by mouth daily with breakfast. 02/08/15   Brunetta Jeans, PA-C  methylPREDNIsolone (MEDROL DOSPACK) 4 MG tablet  follow package directions Patient not taking: Reported on 04/20/2015 03/31/15   Brunetta Jeans, PA-C  omeprazole (PRILOSEC) 20 MG capsule Take 1 capsule (20 mg total) by mouth daily. 02/08/15   Brunetta Jeans, PA-C  simvastatin (ZOCOR) 40 MG tablet Take 1 tablet (40 mg total) by mouth at bedtime. 02/08/15   Brunetta Jeans, PA-C   Triage vitals: BP 180/110 mmHg  Pulse 91  Temp(Src) 98.2 F (36.8 C)  Resp 16  Ht 6' 1" (1.854 m)  Wt 213 lb (96.616 kg)  BMI 28.11 kg/m2  SpO2 95% Physical Exam  Constitutional: He is oriented to person, place, and time. He appears well-developed and well-nourished. No distress.  HENT:  Head: Normocephalic.  Abrasions on the forehead, lacerations on the right supaorbital and infraorbital area  Eyes: Conjunctivae are normal. Pupils are equal, round, and reactive to light. No scleral icterus.  Neck: Normal range of motion. No JVD present. No thyromegaly present.  Non tender   Cardiovascular: Normal rate and regular rhythm.  Exam reveals no gallop and no friction rub.   No murmur heard. Pulmonary/Chest: Effort normal and breath sounds normal. No respiratory distress. He has no wheezes. He has no rales.  Abdominal: Soft. Bowel sounds are normal. He exhibits no distension and no mass. There is no tenderness.  Musculoskeletal: Normal range of motion.  Mild abrasion and swelling of right shoulder Mild pain with passing ROM Abrasion of dorsum of left hand with mild swelling over the 2nd and 3rd mcp joints Abrasion of dorsum of right hand, no swelling, no tenderness Abrasions of both knees, no swelling, no tenderness, full ROM without pain  Neurological: He is alert and oriented to person, place, and time. No cranial nerve deficit. He exhibits normal muscle tone. Coordination normal.  Skin: Skin is warm and dry. No rash noted.  Psychiatric: He has a normal mood and affect. His behavior is normal. Thought content normal.  Nursing note and vitals reviewed.   ED Course  Procedures  LACERATION REPAIR Performed by: JQZES,PQZRA Authorized by: QTMAU,QJFHL Consent: Verbal consent obtained. Risks and benefits: risks, benefits and alternatives were discussed Consent given by: patient Patient identity confirmed: provided demographic data Prepped and Draped in normal sterile fashion Wound explored  Laceration Location: Right eyebrow  Laceration Length: 2.5 cm  No Foreign Bodies seen or palpated  Anesthesia: local infiltration  Local anesthetic: lidocaine 2% with epinephrine  Anesthetic total: 2 ml  Amount of cleaning: standard  Skin closure: Close   Number of sutures: 5  Technique: Simple interrupted with 5-0 vicryl rapide  Patient tolerance: Patient tolerated the procedure well with no immediate complications.  LACERATION REPAIR Performed by: KTGYB,WLSLH Authorized by: TDSKA,JGOTL Consent: Verbal consent obtained. Risks and benefits: risks, benefits  and alternatives were discussed Consent given by: patient Patient identity confirmed: provided demographic data Prepped and Draped in normal sterile fashion Wound explored  Laceration Location: Right infraorbital. Please note that the laceration is somewhat complex because of stellate nature  Laceration Length: 3.0 cm  No Foreign Bodies seen or palpated  Anesthesia: local infiltration  Local anesthetic: lidocaine 2% with epinephrine  Anesthetic total: 6 ml  Amount of cleaning: standard  Skin closure: Close   Number of sutures: 6   Technique: Simple interrupted with plastic type closure of stellate flaps. Suture material was 5-0 vicrly rapide  Patient tolerance: Patient tolerated the procedure well with no immediate complications.  DIAGNOSTIC STUDIES: Oxygen Saturation is 95% on RA, adequate by my interpretation.  COORDINATION OF  CARE:  11:40 PM Discussed treatment plan which includes CT scan, stitches above and below the right eye with pt at bedside and pt agreed to plan.   Imaging Review Dg Shoulder Right  06/09/2015   CLINICAL DATA:  Status post fall, with right shoulder pain and limited range of motion. Initial encounter.  EXAM: RIGHT SHOULDER - 2+ VIEW  COMPARISON:  None.  FINDINGS: There is no evidence of fracture or dislocation. The right humeral head is seated within the glenoid fossa. Mild degenerative change is noted at the right acromioclavicular joint. No significant soft tissue abnormalities are seen. The visualized portions of the right lung are clear.  IMPRESSION: No evidence of fracture or dislocation.   Electronically Signed   By: Garald Balding M.D.   On: 06/09/2015 00:51   Ct Head Wo Contrast  06/09/2015   CLINICAL DATA:  Initial evaluation for acute trauma, fall.  EXAM: CT HEAD WITHOUT CONTRAST  CT MAXILLOFACIAL WITHOUT CONTRAST  CT CERVICAL SPINE WITHOUT CONTRAST  TECHNIQUE: Multidetector CT imaging of the head, cervical spine, and maxillofacial structures  were performed using the standard protocol without intravenous contrast. Multiplanar CT image reconstructions of the cervical spine and maxillofacial structures were also generated.  COMPARISON:  None.  FINDINGS: CT HEAD FINDINGS  Diffuse prominence of the CSF containing spaces is compatible with generalized cerebral atrophy. Patchy hypodensity within the periventricular and deep white matter most consistent with chronic small vessel ischemic disease.  No acute large vessel territory infarct. No intracranial hemorrhage. No extra-axial fluid collection. No mass lesion, midline shift, or mass effect. No hydrocephalus.  Right forehead and periorbital contusion present. Globes are intact. No mastoid effusion.  Calvarium intact.  CT MAXILLOFACIAL FINDINGS  Prominent soft tissue swelling present at the right forehead and right periorbital region. There is an associated laceration. No radiopaque foreign body. Globes are intact. No retro-orbital pathology bony orbits are intact. No orbital floor fracture.  Zygomatic arches are intact. No maxillary fracture. Pterygoid plates are intact. No nasal bone fracture. Nasal septum intact.  Mandible is intact. Patient is edentulous. Mandibular condyles normally situated within the temporomandibular fossa. Degenerative changes noted about the temporomandibular joints bilaterally.  Paranasal sinuses are well pneumatized and free of fluid.  CT CERVICAL SPINE FINDINGS  There is trace anterolisthesis of C5 on C6. Otherwise, the vertebral bodies are normally aligned with preservation of the normal cervical lordosis. Vertebral body heights are preserved. Normal C1-2 articulations are intact. No prevertebral soft tissue swelling. No acute fracture or listhesis. Apparent linear lucency traversing the left lateral mass of C2-1 coronal sequence favored to reflect streak artifact versus possible nutrient foramen  Moderate to advanced multilevel degenerative disc disease and facet arthropathy  present throughout the cervical spine, most prevalent at the C6-7 level. Facet arthropathy most severe at C4-5 on the right.  Visualized soft tissues of the neck are within normal limits. Visualized lung apices are clear without evidence of apical pneumothorax. Vascular calcifications present about the carotid bifurcations.  IMPRESSION: CT HEAD:  1. No acute intracranial process. 2. Generalized age-related cerebral atrophy with moderate chronic microvascular ischemic disease.  CT MAXILLOFACIAL:  1. Right forehead and periorbital soft tissue contusion with laceration. No radiopaque foreign body. 2. No acute maxillofacial fracture. Intact globes with no retro-orbital pathology.  CT CERVICAL SPINE:  1. No acute traumatic injury within the cervical spine. 2. Moderate to advanced multilevel degenerative disc disease, most prevalent at C6-7.   Electronically Signed   By: Pincus Badder.D.  On: 06/09/2015 00:59   Ct Cervical Spine Wo Contrast  06/09/2015   CLINICAL DATA:  Initial evaluation for acute trauma, fall.  EXAM: CT HEAD WITHOUT CONTRAST  CT MAXILLOFACIAL WITHOUT CONTRAST  CT CERVICAL SPINE WITHOUT CONTRAST  TECHNIQUE: Multidetector CT imaging of the head, cervical spine, and maxillofacial structures were performed using the standard protocol without intravenous contrast. Multiplanar CT image reconstructions of the cervical spine and maxillofacial structures were also generated.  COMPARISON:  None.  FINDINGS: CT HEAD FINDINGS  Diffuse prominence of the CSF containing spaces is compatible with generalized cerebral atrophy. Patchy hypodensity within the periventricular and deep white matter most consistent with chronic small vessel ischemic disease.  No acute large vessel territory infarct. No intracranial hemorrhage. No extra-axial fluid collection. No mass lesion, midline shift, or mass effect. No hydrocephalus.  Right forehead and periorbital contusion present. Globes are intact. No mastoid effusion.   Calvarium intact.  CT MAXILLOFACIAL FINDINGS  Prominent soft tissue swelling present at the right forehead and right periorbital region. There is an associated laceration. No radiopaque foreign body. Globes are intact. No retro-orbital pathology bony orbits are intact. No orbital floor fracture.  Zygomatic arches are intact. No maxillary fracture. Pterygoid plates are intact. No nasal bone fracture. Nasal septum intact.  Mandible is intact. Patient is edentulous. Mandibular condyles normally situated within the temporomandibular fossa. Degenerative changes noted about the temporomandibular joints bilaterally.  Paranasal sinuses are well pneumatized and free of fluid.  CT CERVICAL SPINE FINDINGS  There is trace anterolisthesis of C5 on C6. Otherwise, the vertebral bodies are normally aligned with preservation of the normal cervical lordosis. Vertebral body heights are preserved. Normal C1-2 articulations are intact. No prevertebral soft tissue swelling. No acute fracture or listhesis. Apparent linear lucency traversing the left lateral mass of C2-1 coronal sequence favored to reflect streak artifact versus possible nutrient foramen  Moderate to advanced multilevel degenerative disc disease and facet arthropathy present throughout the cervical spine, most prevalent at the C6-7 level. Facet arthropathy most severe at C4-5 on the right.  Visualized soft tissues of the neck are within normal limits. Visualized lung apices are clear without evidence of apical pneumothorax. Vascular calcifications present about the carotid bifurcations.  IMPRESSION: CT HEAD:  1. No acute intracranial process. 2. Generalized age-related cerebral atrophy with moderate chronic microvascular ischemic disease.  CT MAXILLOFACIAL:  1. Right forehead and periorbital soft tissue contusion with laceration. No radiopaque foreign body. 2. No acute maxillofacial fracture. Intact globes with no retro-orbital pathology.  CT CERVICAL SPINE:  1. No acute  traumatic injury within the cervical spine. 2. Moderate to advanced multilevel degenerative disc disease, most prevalent at C6-7.   Electronically Signed   By: Jeannine Boga M.D.   On: 06/09/2015 00:59   Dg Hand Complete Left  06/09/2015   CLINICAL DATA:  Acute onset of left hand pain after fall. Laceration and bleeding at the second and third metacarpals. Initial encounter.  EXAM: LEFT HAND - COMPLETE 3+ VIEW  COMPARISON:  None.  FINDINGS: There is no evidence of fracture or dislocation. The joint spaces are preserved. The carpal rows are intact, and demonstrate normal alignment. Diffuse soft tissue swelling is noted about the metacarpophalangeal joints. Known soft tissue lacerations are not well characterized on radiograph.  IMPRESSION: No evidence of fracture or dislocation.   Electronically Signed   By: Garald Balding M.D.   On: 06/09/2015 00:52   Ct Maxillofacial Wo Cm  06/09/2015   CLINICAL DATA:  Initial evaluation for acute  trauma, fall.  EXAM: CT HEAD WITHOUT CONTRAST  CT MAXILLOFACIAL WITHOUT CONTRAST  CT CERVICAL SPINE WITHOUT CONTRAST  TECHNIQUE: Multidetector CT imaging of the head, cervical spine, and maxillofacial structures were performed using the standard protocol without intravenous contrast. Multiplanar CT image reconstructions of the cervical spine and maxillofacial structures were also generated.  COMPARISON:  None.  FINDINGS: CT HEAD FINDINGS  Diffuse prominence of the CSF containing spaces is compatible with generalized cerebral atrophy. Patchy hypodensity within the periventricular and deep white matter most consistent with chronic small vessel ischemic disease.  No acute large vessel territory infarct. No intracranial hemorrhage. No extra-axial fluid collection. No mass lesion, midline shift, or mass effect. No hydrocephalus.  Right forehead and periorbital contusion present. Globes are intact. No mastoid effusion.  Calvarium intact.  CT MAXILLOFACIAL FINDINGS  Prominent soft  tissue swelling present at the right forehead and right periorbital region. There is an associated laceration. No radiopaque foreign body. Globes are intact. No retro-orbital pathology bony orbits are intact. No orbital floor fracture.  Zygomatic arches are intact. No maxillary fracture. Pterygoid plates are intact. No nasal bone fracture. Nasal septum intact.  Mandible is intact. Patient is edentulous. Mandibular condyles normally situated within the temporomandibular fossa. Degenerative changes noted about the temporomandibular joints bilaterally.  Paranasal sinuses are well pneumatized and free of fluid.  CT CERVICAL SPINE FINDINGS  There is trace anterolisthesis of C5 on C6. Otherwise, the vertebral bodies are normally aligned with preservation of the normal cervical lordosis. Vertebral body heights are preserved. Normal C1-2 articulations are intact. No prevertebral soft tissue swelling. No acute fracture or listhesis. Apparent linear lucency traversing the left lateral mass of C2-1 coronal sequence favored to reflect streak artifact versus possible nutrient foramen  Moderate to advanced multilevel degenerative disc disease and facet arthropathy present throughout the cervical spine, most prevalent at the C6-7 level. Facet arthropathy most severe at C4-5 on the right.  Visualized soft tissues of the neck are within normal limits. Visualized lung apices are clear without evidence of apical pneumothorax. Vascular calcifications present about the carotid bifurcations.  IMPRESSION: CT HEAD:  1. No acute intracranial process. 2. Generalized age-related cerebral atrophy with moderate chronic microvascular ischemic disease.  CT MAXILLOFACIAL:  1. Right forehead and periorbital soft tissue contusion with laceration. No radiopaque foreign body. 2. No acute maxillofacial fracture. Intact globes with no retro-orbital pathology.  CT CERVICAL SPINE:  1. No acute traumatic injury within the cervical spine. 2. Moderate to  advanced multilevel degenerative disc disease, most prevalent at C6-7.   Electronically Signed   By: Jeannine Boga M.D.   On: 06/09/2015 00:59     MDM   Final diagnoses:  Fall from slip, trip, or stumble  Laceration of right eyebrow without complication, initial encounter  Laceration of face, complex, initial encounter  Contusion of right shoulder, initial encounter  Abrasion, knee, left, initial encounter  Abrasion, knee, right, initial encounter  Abrasion, hand, left, initial encounter  Abrasion, hand, right, initial encounter  Abrasion of forehead, initial encounter  Abrasion of nose, initial encounter    Fall with complex facial laceration and injury to right shoulder, both hands, both knees. Only extremity injury that seems significant is his shoulder. Is also swelling of the left hand. X-rays are obtained of these showing no evidence of fracture. He also had CT of head, cervical spine, maxillofacial showing no fractures and no intracranial injury. Lacerations were closed with absorbable suture material. He is discharged with prescription for tramadol for pain  and is advised to watch for signs of infection.  I personally performed the services described in this documentation, which was scribed in my presence. The recorded information has been reviewed and is accurate.    Delora Fuel, MD [id number]

## 2015-06-09 ENCOUNTER — Encounter: Payer: Self-pay | Admitting: Physician Assistant

## 2015-06-09 ENCOUNTER — Ambulatory Visit (INDEPENDENT_AMBULATORY_CARE_PROVIDER_SITE_OTHER): Payer: Commercial Managed Care - HMO | Admitting: Physician Assistant

## 2015-06-09 ENCOUNTER — Telehealth: Payer: Self-pay | Admitting: Physician Assistant

## 2015-06-09 ENCOUNTER — Emergency Department (HOSPITAL_BASED_OUTPATIENT_CLINIC_OR_DEPARTMENT_OTHER): Payer: Commercial Managed Care - HMO

## 2015-06-09 VITALS — BP 124/85 | HR 98 | Temp 98.7°F | Resp 18 | Ht 73.0 in | Wt 218.2 lb

## 2015-06-09 DIAGNOSIS — W19XXXD Unspecified fall, subsequent encounter: Secondary | ICD-10-CM | POA: Diagnosis not present

## 2015-06-09 DIAGNOSIS — S0181XA Laceration without foreign body of other part of head, initial encounter: Secondary | ICD-10-CM

## 2015-06-09 DIAGNOSIS — Y92009 Unspecified place in unspecified non-institutional (private) residence as the place of occurrence of the external cause: Principal | ICD-10-CM

## 2015-06-09 MED ORDER — TRAMADOL HCL 50 MG PO TABS
50.0000 mg | ORAL_TABLET | Freq: Once | ORAL | Status: AC
  Administered 2015-06-09: 50 mg via ORAL

## 2015-06-09 MED ORDER — TRAMADOL HCL 50 MG PO TABS
ORAL_TABLET | ORAL | Status: AC
  Filled 2015-06-09: qty 1

## 2015-06-09 MED ORDER — TRAMADOL HCL 50 MG PO TABS: 50.0000 mg | ORAL_TABLET | Freq: Four times a day (QID) | ORAL | Status: DC | PRN

## 2015-06-09 MED ORDER — LIDOCAINE-EPINEPHRINE (PF) 2 %-1:200000 IJ SOLN
10.0000 mL | Freq: Once | INTRAMUSCULAR | Status: AC
  Administered 2015-06-09: 10 mL

## 2015-06-09 MED ORDER — LIDOCAINE-EPINEPHRINE (PF) 2 %-1:200000 IJ SOLN
INTRAMUSCULAR | Status: AC
  Filled 2015-06-09: qty 10

## 2015-06-09 NOTE — Discharge Instructions (Signed)
Apply bacitracin or Neosporin ointment to the lacerations and abraded areas. Watch for signs of infection. If any develop, see her PCP or return to the ED. Your stitches will dissolve, so you do not need to see anyone to have them removed. Take acetaminophen or ibuprofen for less severe pain. Reserve tramadol for more severe pain.  Laceration Care, Adult A laceration is a cut or lesion that goes through all layers of the skin and into the tissue just beneath the skin. TREATMENT  Some lacerations may not require closure. Some lacerations may not be able to be closed due to an increased risk of infection. It is important to see your caregiver as soon as possible after an injury to minimize the risk of infection and maximize the opportunity for successful closure. If closure is appropriate, pain medicines may be given, if needed. The wound will be cleaned to help prevent infection. Your caregiver will use stitches (sutures), staples, wound glue (adhesive), or skin adhesive strips to repair the laceration. These tools bring the skin edges together to allow for faster healing and a better cosmetic outcome. However, all wounds will heal with a scar. Once the wound has healed, scarring can be minimized by covering the wound with sunscreen during the day for 1 full year. HOME CARE INSTRUCTIONS  For sutures or staples:  Keep the wound clean and dry.  If you were given a bandage (dressing), you should change it at least once a day. Also, change the dressing if it becomes wet or dirty, or as directed by your caregiver.  Wash the wound with soap and water 2 times a day. Rinse the wound off with water to remove all soap. Pat the wound dry with a clean towel.  After cleaning, apply a thin layer of the antibiotic ointment as recommended by your caregiver. This will help prevent infection and keep the dressing from sticking.  You may shower as usual after the first 24 hours. Do not soak the wound in water until  the sutures are removed.  Only take over-the-counter or prescription medicines for pain, discomfort, or fever as directed by your caregiver.  Get your sutures or staples removed as directed by your caregiver. For skin adhesive strips:  Keep the wound clean and dry.  Do not get the skin adhesive strips wet. You may bathe carefully, using caution to keep the wound dry.  If the wound gets wet, pat it dry with a clean towel.  Skin adhesive strips will fall off on their own. You may trim the strips as the wound heals. Do not remove skin adhesive strips that are still stuck to the wound. They will fall off in time. For wound adhesive:  You may briefly wet your wound in the shower or bath. Do not soak or scrub the wound. Do not swim. Avoid periods of heavy perspiration until the skin adhesive has fallen off on its own. After showering or bathing, gently pat the wound dry with a clean towel.  Do not apply liquid medicine, cream medicine, or ointment medicine to your wound while the skin adhesive is in place. This may loosen the film before your wound is healed.  If a dressing is placed over the wound, be careful not to apply tape directly over the skin adhesive. This may cause the adhesive to be pulled off before the wound is healed.  Avoid prolonged exposure to sunlight or tanning lamps while the skin adhesive is in place. Exposure to ultraviolet light in the  first year will darken the scar.  The skin adhesive will usually remain in place for 5 to 10 days, then naturally fall off the skin. Do not pick at the adhesive film. You may need a tetanus shot if:  You cannot remember when you had your last tetanus shot.  You have never had a tetanus shot. If you get a tetanus shot, your arm may swell, get red, and feel warm to the touch. This is common and not a problem. If you need a tetanus shot and you choose not to have one, there is a rare chance of getting tetanus. Sickness from tetanus can be  serious. SEEK MEDICAL CARE IF:   You have redness, swelling, or increasing pain in the wound.  You see a red line that goes away from the wound.  You have yellowish-white fluid (pus) coming from the wound.  You have a fever.  You notice a bad smell coming from the wound or dressing.  Your wound breaks open before or after sutures have been removed.  You notice something coming out of the wound such as wood or glass.  Your wound is on your hand or foot and you cannot move a finger or toe. SEEK IMMEDIATE MEDICAL CARE IF:   Your pain is not controlled with prescribed medicine.  You have severe swelling around the wound causing pain and numbness or a change in color in your arm, hand, leg, or foot.  Your wound splits open and starts bleeding.  You have worsening numbness, weakness, or loss of function of any joint around or beyond the wound.  You develop painful lumps near the wound or on the skin anywhere on your body. MAKE SURE YOU:   Understand these instructions.  Will watch your condition.  Will get help right away if you are not doing well or get worse. Document Released: 12/11/2005 Document Revised: 03/04/2012 Document Reviewed: 06/06/2011 [facility] Patient Information 2015 [place], [place]. This information is not intended to replace advice given to you by your health care provider. Make sure you discuss any questions you have with your health care provider.  Abrasion An abrasion is a cut or scrape of the skin. Abrasions do not extend through all layers of the skin and most heal within 10 days. It is important to care for your abrasion properly to prevent infection. CAUSES  Most abrasions are caused by falling on, or gliding across, the ground or other surface. When your skin rubs on something, the outer and inner layer of skin rubs off, causing an abrasion. DIAGNOSIS  Your caregiver will be able to diagnose an abrasion during a physical exam.  TREATMENT  Your  treatment depends on how large and deep the abrasion is. Generally, your abrasion will be cleaned with water and a mild soap to remove any dirt or debris. An antibiotic ointment may be put over the abrasion to prevent an infection. A bandage (dressing) may be wrapped around the abrasion to keep it from getting dirty.  You may need a tetanus shot if:  You cannot remember when you had your last tetanus shot.  You have never had a tetanus shot.  The injury broke your skin. If you get a tetanus shot, your arm may swell, get red, and feel warm to the touch. This is common and not a problem. If you need a tetanus shot and you choose not to have one, there is a rare chance of getting tetanus. Sickness from tetanus can be serious.  HOME CARE INSTRUCTIONS   If a dressing was applied, change it at least once a day or as directed by your caregiver. If the bandage sticks, soak it off with warm water.   Wash the area with water and a mild soap to remove all the ointment 2 times a day. Rinse off the soap and pat the area dry with a clean towel.   Reapply any ointment as directed by your caregiver. This will help prevent infection and keep the bandage from sticking. Use gauze over the wound and under the dressing to help keep the bandage from sticking.   Change your dressing right away if it becomes wet or dirty.   Only take over-the-counter or prescription medicines for pain, discomfort, or fever as directed by your caregiver.   Follow up with your caregiver within 24-48 hours for a wound check, or as directed. If you were not given a wound-check appointment, look closely at your abrasion for redness, swelling, or pus. These are signs of infection. SEEK IMMEDIATE MEDICAL CARE IF:   You have increasing pain in the wound.   You have redness, swelling, or tenderness around the wound.   You have pus coming from the wound.   You have a fever or persistent symptoms for more than 2-3 days.  You  have a fever and your symptoms suddenly get worse.  You have a bad smell coming from the wound or dressing.  MAKE SURE YOU:   Understand these instructions.  Will watch your condition.  Will get help right away if you are not doing well or get worse. Document Released: 09/20/2005 Document Revised: 11/27/2012 Document Reviewed: 11/14/2011 [facility] Patient Information 2015 [place], [place]. This information is not intended to replace advice given to you by your health care provider. Make sure you discuss any questions you have with your health care provider.  Contusion A contusion is a deep bruise. Contusions are the result of an injury that caused bleeding under the skin. The contusion may turn blue, purple, or yellow. Minor injuries will give you a painless contusion, but more severe contusions may stay painful and swollen for a few weeks.  CAUSES  A contusion is usually caused by a blow, trauma, or direct force to an area of the body. SYMPTOMS   Swelling and redness of the injured area.  Bruising of the injured area.  Tenderness and soreness of the injured area.  Pain. DIAGNOSIS  The diagnosis can be made by taking a history and physical exam. An X-ray, CT scan, or MRI may be needed to determine if there were any associated injuries, such as fractures. TREATMENT  Specific treatment will depend on what area of the body was injured. In general, the best treatment for a contusion is resting, icing, elevating, and applying cold compresses to the injured area. Over-the-counter medicines may also be recommended for pain control. Ask your caregiver what the best treatment is for your contusion. HOME CARE INSTRUCTIONS   Put ice on the injured area.  Put ice in a plastic bag.  Place a towel between your skin and the bag.  Leave the ice on for 15-20 minutes, 3-4 times a day, or as directed by your health care provider.  Only take over-the-counter or prescription medicines for pain,  discomfort, or fever as directed by your caregiver. Your caregiver may recommend avoiding anti-inflammatory medicines (aspirin, ibuprofen, and naproxen) for 48 hours because these medicines may increase bruising.  Rest the injured area.  If possible, elevate the  injured area to reduce swelling. SEEK IMMEDIATE MEDICAL CARE IF:   You have increased bruising or swelling.  You have pain that is getting worse.  Your swelling or pain is not relieved with medicines. MAKE SURE YOU:   Understand these instructions.  Will watch your condition.  Will get help right away if you are not doing well or get worse. Document Released: 09/20/2005 Document Revised: 12/16/2013 Document Reviewed: 10/16/2011 [facility] Patient Information 2015 [place], [place]. This information is not intended to replace advice given to you by your health care provider. Make sure you discuss any questions you have with your health care provider.  Tramadol tablets What is this medicine? TRAMADOL (TRA ma dole) is a pain reliever. It is used to treat moderate to severe pain in adults. This medicine may be used for other purposes; ask your health care provider or pharmacist if you have questions. COMMON BRAND NAME(S): Ultram What should I tell my health care provider before I take this medicine? They need to know if you have any of these conditions: -brain tumor -depression -drug abuse or addiction -head injury -if you frequently drink alcohol containing drinks -kidney disease or trouble passing urine -liver disease -lung disease, asthma, or breathing problems -seizures or epilepsy -suicidal thoughts, plans, or attempt; a previous suicide attempt by you or a family member -an unusual or allergic reaction to tramadol, codeine, other medicines, foods, dyes, or preservatives -pregnant or trying to get pregnant -breast-feeding How should I use this medicine? Take this medicine by mouth with a full glass of water. Follow the  directions on the prescription label. If the medicine upsets your stomach, take it with food or milk. Do not take more medicine than you are told to take. Talk to your pediatrician regarding the use of this medicine in children. Special care may be needed. Overdosage: If you think you have taken too much of this medicine contact a poison control center or emergency room at once. NOTE: This medicine is only for you. Do not share this medicine with others. What if I miss a dose? If you miss a dose, take it as soon as you can. If it is almost time for your next dose, take only that dose. Do not take double or extra doses. What may interact with this medicine? Do not take this medicine with any of the following medications: -MAOIs like Carbex, Eldepryl, Marplan, Nardil, and Parnate This medicine may also interact with the following medications: -alcohol or medicines that contain alcohol -antihistamines -benzodiazepines -bupropion -carbamazepine or oxcarbazepine -clozapine -cyclobenzaprine -digoxin -furazolidone -linezolid -medicines for depression, anxiety, or psychotic disturbances -medicines for migraine headache like almotriptan, eletriptan, frovatriptan, naratriptan, rizatriptan, sumatriptan, zolmitriptan -medicines for pain like pentazocine, buprenorphine, butorphanol, meperidine, nalbuphine, and propoxyphene -medicines for sleep -muscle relaxants -naltrexone -phenobarbital -phenothiazines like perphenazine, thioridazine, chlorpromazine, mesoridazine, fluphenazine, prochlorperazine, promazine, and trifluoperazine -procarbazine -warfarin This list may not describe all possible interactions. Give your health care provider a list of all the medicines, herbs, non-prescription drugs, or dietary supplements you use. Also tell them if you smoke, drink alcohol, or use illegal drugs. Some items may interact with your medicine. What should I watch for while using this medicine? Tell your  doctor or health care professional if your pain does not go away, if it gets worse, or if you have new or a different type of pain. You may develop tolerance to the medicine. Tolerance means that you will need a higher dose of the medicine for pain relief. Tolerance is normal  and is expected if you take this medicine for a long time. Do not suddenly stop taking your medicine because you may develop a severe reaction. Your body becomes used to the medicine. This does NOT mean you are addicted. Addiction is a behavior related to getting and using a drug for a non-medical reason. If you have pain, you have a medical reason to take pain medicine. Your doctor will tell you how much medicine to take. If your doctor wants you to stop the medicine, the dose will be slowly lowered over time to avoid any side effects. You may get drowsy or dizzy. Do not drive, use machinery, or do anything that needs mental alertness until you know how this medicine affects you. Do not stand or sit up quickly, especially if you are an older patient. This reduces the risk of dizzy or fainting spells. Alcohol can increase or decrease the effects of this medicine. Avoid alcoholic drinks. You may have constipation. Try to have a bowel movement at least every 2 to 3 days. If you do not have a bowel movement for 3 days, call your doctor or health care professional. Your mouth may get dry. Chewing sugarless gum or sucking hard candy, and drinking plenty of water may help. Contact your doctor if the problem does not go away or is severe. What side effects may I notice from receiving this medicine? Side effects that you should report to your doctor or health care professional as soon as possible: -allergic reactions like skin rash, itching or hives, swelling of the face, lips, or tongue -breathing difficulties, wheezing -confusion -itching -light headedness or fainting spells -redness, blistering, peeling or loosening of the skin,  including inside the mouth -seizures Side effects that usually do not require medical attention (report to your doctor or health care professional if they continue or are bothersome): -constipation -dizziness -drowsiness -headache -nausea, vomiting This list may not describe all possible side effects. Call your doctor for medical advice about side effects. You may report side effects to FDA at 1-800-FDA-1088. Where should I keep my medicine? Keep out of the reach of children. Store at room temperature between 15 and 30 degrees C (59 and 86 degrees F). Keep container tightly closed. Throw away any unused medicine after the expiration date. NOTE: This sheet is a summary. It may not cover all possible information. If you have questions about this medicine, talk to your doctor, pharmacist, or health care provider.  2015, [employer]. (2010-08-24 11:55:44)

## 2015-06-09 NOTE — Telephone Encounter (Signed)
Caller name: [name] Relation to pt: wife Call back number: [phone] Pharmacy:  Reason for call:   Patient was seen last night in the ED and was given stitches. When does patient need to come in for ED follow up. Also, patient wife has questions on how to care for this until patient is seen.

## 2015-06-09 NOTE — ED Notes (Signed)
MD at bedside suturing patient's face.

## 2015-06-09 NOTE — Telephone Encounter (Signed)
Spoke with patient's wife who stated that he is continuing to have bloody drainage, no signs of infection, but she would prefer he be seen today.  Scheduled with Elyn Aquas, PA at 5:15 and provider notified.

## 2015-06-09 NOTE — Progress Notes (Signed)
Pre visit review using our clinic review tool, if applicable. No additional management support is needed unless otherwise documented below in the visit note/SLS  

## 2015-06-09 NOTE — Telephone Encounter (Signed)
Review of ER note reveals that they used absorbable suture which will dissolve on their own.  I would like to see him in 1 week to reassess.  They should keep area clean and dry and monitor area for any increasing tenderness, redness or pus drainage. Return sooner if these occur.

## 2015-06-09 NOTE — Patient Instructions (Signed)
Please keep areas clean and dry.  Continue medicated dressing given by ER physician to use above eye.  Clean twice daily and redress. Continue pain medications as directed.   Follow-up with me on Monday. If you notice any worsening pain, redness or drainage, come see [place] immediately.

## 2015-06-09 NOTE — ED Notes (Signed)
MD at bedside. 

## 2015-06-14 ENCOUNTER — Ambulatory Visit (INDEPENDENT_AMBULATORY_CARE_PROVIDER_SITE_OTHER): Payer: Commercial Managed Care - HMO | Admitting: Physician Assistant

## 2015-06-14 ENCOUNTER — Encounter: Payer: Self-pay | Admitting: Physician Assistant

## 2015-06-14 VITALS — BP 134/75 | HR 101 | Temp 98.4°F | Resp 16 | Ht 73.0 in | Wt 218.4 lb

## 2015-06-14 DIAGNOSIS — Y92009 Unspecified place in unspecified non-institutional (private) residence as the place of occurrence of the external cause: Principal | ICD-10-CM

## 2015-06-14 DIAGNOSIS — Z5189 Encounter for other specified aftercare: Secondary | ICD-10-CM

## 2015-06-14 DIAGNOSIS — W19XXXS Unspecified fall, sequela: Secondary | ICD-10-CM | POA: Diagnosis not present

## 2015-06-14 DIAGNOSIS — W19XXXA Unspecified fall, initial encounter: Secondary | ICD-10-CM | POA: Insufficient documentation

## 2015-06-14 NOTE — Assessment & Plan Note (Signed)
Wounds healing nicely. Patient feeling better but some residual decreased ROM with pain at R shoulder.  Continue wound care and pain management. If ROM not continuing to improve over next 3 days, patient is to call so MRI can be obtained.  Otherwise follow-up in 2 weeks.

## 2015-06-14 NOTE — Assessment & Plan Note (Signed)
Sutures in place.  Wounds cleaned, assessed and redressed.  No evidence of infection. Wound care discussed with patient and wife.  Tramadol for pain. Alarm signs and symptoms reviewed with patient and wife that would prompt ER visit. Follow-up on Monday.

## 2015-06-14 NOTE — Patient Instructions (Signed)
Please continue to keep areas clean and dry.  Your wounds are looking much better! Continue pain medication as directed for shoulder.  Avoid heavy lifting but get up and move around some.  If shoulder pain and ROM do not continue to improve over the next 3 days, call me and we will proceed with MRI.  If everything continues to improve, follow-up in 2 weeks.

## 2015-06-14 NOTE — Progress Notes (Signed)
Patient presents to clinic today at the request of his wife for reassessment of wounds suffered during a fall late last night.  Patient seen in ER earlier this AM for assessment. Imaging including Ct Head, Cervical Spine and Maxillofacial negative for acute process.  Patient suffered two lacerations to the orbital region, sutured with absorbable materials and bandaged. Multiple superficial abrasions to knees bilaterally, R hand and nose without fracture.  Patient taking tylenol with moderate relief in symptoms.  States he feels tired.   Past Medical History  Diagnosis Date  . Allergic rhinitis   . Sinusitis   . Dyspnea   . Hypertension   . Hypercholesteremia   . Borderline diabetes mellitus   . Hiatal hernia   . GERD (gastroesophageal reflux disease)   . Peyronie's disease   . Lumbar back pain   . Chicken pox   . Diabetes mellitus without complication   . Melanoma 2014    shoulder  . 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 History of stomach ulcers     1985    Current Outpatient Prescriptions on File Prior to Visit  Medication Sig Dispense Refill  . aspirin 81 MG tablet Take 81 mg by mouth daily.     . Blood Glucose Monitoring Suppl (ACCU-CHEK AVIVA PLUS) W/DEVICE KIT Test blood sugar once daily 1 kit 0  . glucose blood (ACCU-CHEK AVIVA) test strip Use as instructed 100 each 12  . lisinopril (PRINIVIL,ZESTRIL) 10 MG tablet Take 1 tablet (10 mg total) by mouth daily. 90 tablet 1  . metFORMIN (GLUCOPHAGE-XR) 500 MG 24 hr tablet Take 1 tablet (500 mg total) by mouth daily with breakfast. 90 tablet 1  . omeprazole (PRILOSEC) 20 MG capsule Take 1 capsule (20 mg total) by mouth daily. 90 capsule 1  . simvastatin (ZOCOR) 40 MG tablet Take 1 tablet (40 mg total) by mouth at bedtime. 90 tablet 1  . traMADol (ULTRAM) 50 MG tablet Take 1 tablet (50 mg total) by mouth every 6 (six) hours as needed. 15 tablet 0   No current facility-administered medications on file prior to visit.     Allergies  Allergen Reactions  . Penicillins     REACTION: rash and itching    Family History  Problem Relation Age of Onset  . Diabetes Mother     Living  . Dementia Mother   . Dementia Father     Deceased  . Diabetes Sister   . Healthy Son     x2  . Healthy Daughter     x1  . Colon cancer Neg Hx     History   Social History  . Marital Status: Married    Spouse Name: N/A  . Number of Children: 1  . 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 Smoking status: Never Smoker   . Smokeless tobacco: Never Used  . Alcohol Use: 0.6 oz/week    1 Glasses of wine per week  . Drug Use: No  . Sexual Activity: Not on file   Other Topics Concern  . None   Social History Narrative   Review of Systems - See HPI.  All other ROS are negative.  BP 124/85 mmHg  Pulse 98  Temp(Src) 98.7 F (37.1 C) (Oral)  Resp 18  Ht _0  (1.854 m)  Wt 218 lb 4 oz (98.998 kg)  BMI 28.80 kg/m2  SpO2  96%  Physical Exam  Constitutional: He is oriented to person, place, and time and well-developed, well-nourished, and in no distress.  HENT:  Head: Normocephalic. Head is with abrasion and with laceration. Head is without raccoon's eyes, without Battle's sign, without right periorbital erythema and without left periorbital erythema.  Nose:    Eyes: Conjunctivae are normal. Pupils are equal, round, and reactive to light.  Neck: Neck supple.  Cardiovascular: Normal rate, regular rhythm, normal heart sounds and intact distal pulses.   Pulmonary/Chest: Effort normal and breath sounds normal. No respiratory distress. He has no wheezes. He has no rales. He exhibits no tenderness.  Neurological: He is alert and oriented to person, place, and time. No cranial nerve deficit.  Skin: Skin is warm and dry.     Psychiatric: Affect normal.  Vitals reviewed.  Assessment/Plan: Fall at home Sutures in place.  Wounds cleaned, assessed  and redressed.  No evidence of infection. Wound care discussed with patient and wife.  Tramadol for pain. Alarm signs and symptoms reviewed with patient and wife that would prompt ER visit. Follow-up on Monday.

## 2015-06-14 NOTE — Progress Notes (Signed)
Pre visit review using our clinic review tool, if applicable. No additional management support is needed unless otherwise documented below in the visit note/SLS  

## 2015-06-14 NOTE — Progress Notes (Signed)
Patient presents to clinic today for follow-up to reassess wounds after his fall at home. Patient endorses doing very well overall.  Notes some continued fatigue but denies heacaches, nausea, vision changes or AMS. Denies fever, chills or malaise.  Still with R shoulder pain with painful ROM but notes it is improving and ROM is coming back.  Past Medical History  Diagnosis Date  . Allergic rhinitis   . Sinusitis   . Dyspnea   . Hypertension   . Hypercholesteremia   . Borderline diabetes mellitus   . Hiatal hernia   . GERD (gastroesophageal reflux disease)   . Peyronie's disease   . Lumbar back pain   . Chicken pox   . Diabetes mellitus without complication   . Melanoma 2014    shoulder  . 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 History of stomach ulcers     1985    Current Outpatient Prescriptions on File Prior to Visit  Medication Sig Dispense Refill  . aspirin 81 MG tablet Take 81 mg by mouth daily.     . Blood Glucose Monitoring Suppl (ACCU-CHEK AVIVA PLUS) W/DEVICE KIT Test blood sugar once daily 1 kit 0  . glucose blood (ACCU-CHEK AVIVA) test strip Use as instructed 100 each 12  . lisinopril (PRINIVIL,ZESTRIL) 10 MG tablet Take 1 tablet (10 mg total) by mouth daily. 90 tablet 1  . metFORMIN (GLUCOPHAGE-XR) 500 MG 24 hr tablet Take 1 tablet (500 mg total) by mouth daily with breakfast. 90 tablet 1  . omeprazole (PRILOSEC) 20 MG capsule Take 1 capsule (20 mg total) by mouth daily. 90 capsule 1  . simvastatin (ZOCOR) 40 MG tablet Take 1 tablet (40 mg total) by mouth at bedtime. 90 tablet 1  . traMADol (ULTRAM) 50 MG tablet Take 1 tablet (50 mg total) by mouth every 6 (six) hours as needed. 15 tablet 0   No current facility-administered medications on file prior to visit.    Allergies  Allergen Reactions  . Penicillins     REACTION: rash and itching    Family History  Problem Relation Age of Onset  . Diabetes Mother     Living  . Dementia Mother   . Dementia  Father     Deceased  . Diabetes Sister   . Healthy Son     x2  . Healthy Daughter     x1  . Colon cancer Neg Hx     History   Social History  . Marital Status: Married    Spouse Name: N/A  . Number of Children: 1  . 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 Smoking status: Never Smoker   . Smokeless tobacco: Never Used  . Alcohol Use: 0.6 oz/week    1 Glasses of wine per week  . Drug Use: No  . Sexual Activity: Not on file   Other Topics Concern  . None   Social History Narrative   Review of Systems - See HPI.  All other ROS are negative.  BP 134/75 mmHg  Pulse 101  Temp(Src) 98.4 F (36.9 C) (Oral)  Resp 16  Ht 6' 1" (1.854 m)  Wt 218 lb 6 oz (99.054 kg)  BMI 28.82 kg/m2  SpO2 94%  Physical Exam  Constitutional: He is well-developed, well-nourished, and in no distress.  HENT:  Head: Normocephalic.  Eyes: Conjunctivae are normal.  Neck: Neck supple.  Cardiovascular: Normal rate, regular rhythm, normal heart sounds and intact distal pulses.   Pulmonary/Chest: Effort normal.  Musculoskeletal:       Right shoulder: He exhibits decreased range of motion, tenderness and laceration. He exhibits normal pulse and normal strength.  Neurological: He is alert.  Skin: Skin is warm and dry.  Superficial abrasions of bilateral patellar regions and R posterior shoulder with routine healing.  Sutured lacerations of orbital regions with routine healing and scabbing. No new area of concern.  Psychiatric: Affect normal.   Assessment/Plan: Fall at home Wounds healing nicely. Patient feeling better but some residual decreased ROM with pain at R shoulder.  Continue wound care and pain management. If ROM not continuing to improve over next 3 days, patient is to call so MRI can be obtained.  Otherwise follow-up in 2 weeks.

## 2015-06-29 ENCOUNTER — Encounter: Payer: Self-pay | Admitting: Physician Assistant

## 2015-06-29 ENCOUNTER — Ambulatory Visit (INDEPENDENT_AMBULATORY_CARE_PROVIDER_SITE_OTHER): Payer: Commercial Managed Care - HMO | Admitting: Physician Assistant

## 2015-06-29 VITALS — BP 137/80 | HR 88 | Temp 99.0°F | Ht 73.0 in | Wt 215.8 lb

## 2015-06-29 DIAGNOSIS — W19XXXS Unspecified fall, sequela: Secondary | ICD-10-CM | POA: Diagnosis not present

## 2015-06-29 DIAGNOSIS — Y92009 Unspecified place in unspecified non-institutional (private) residence as the place of occurrence of the external cause: Principal | ICD-10-CM

## 2015-06-29 NOTE — Patient Instructions (Signed)
Everything looks great!  No further wound care needed. Keep areas clean and dry! Your shoulder's range of motion is greatly improved from last visit.  This will continue to get better.  When there is no longer any pain, start to use a 2lb weight to help strengthen the arm and shoulder.  Follow-up if symptoms do not continue to resolve or if anything new develops.

## 2015-06-29 NOTE — Assessment & Plan Note (Signed)
All injuries have healed nicely. ROM of L shoulder full without pain. Supportive measures reviewed. Follow-up PRN.

## 2015-06-29 NOTE — Progress Notes (Signed)
Patient presents to clinic today for 2 week follow-up to reassess injuries sustained during fall.  Patient endorses doing very well. Endorses wounds all healed. Swelling of eye has resolved.  Endorses good ROM of both shoulders without pain. Denies fever, chills, malaise/fatigue, headache or vision changes.  Past Medical History  Diagnosis Date  . Allergic rhinitis   . Sinusitis   . Dyspnea   . Hypertension   . Hypercholesteremia   . Borderline diabetes mellitus   . Hiatal hernia   . GERD (gastroesophageal reflux disease)   . Peyronie's disease   . Lumbar back pain   . Chicken pox   . Diabetes mellitus without complication   . Melanoma 2014    shoulder  . 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 History of stomach ulcers     1985    Current Outpatient Prescriptions on File Prior to Visit  Medication Sig Dispense Refill  . aspirin 81 MG tablet Take 81 mg by mouth daily.     . Blood Glucose Monitoring Suppl (ACCU-CHEK AVIVA PLUS) W/DEVICE KIT Test blood sugar once daily 1 kit 0  . glucose blood (ACCU-CHEK AVIVA) test strip Use as instructed 100 each 12  . lisinopril (PRINIVIL,ZESTRIL) 10 MG tablet Take 1 tablet (10 mg total) by mouth daily. 90 tablet 1  . metFORMIN (GLUCOPHAGE-XR) 500 MG 24 hr tablet Take 1 tablet (500 mg total) by mouth daily with breakfast. 90 tablet 1  . omeprazole (PRILOSEC) 20 MG capsule Take 1 capsule (20 mg total) by mouth daily. 90 capsule 1  . simvastatin (ZOCOR) 40 MG tablet Take 1 tablet (40 mg total) by mouth at bedtime. 90 tablet 1  . traMADol (ULTRAM) 50 MG tablet Take 1 tablet (50 mg total) by mouth every 6 (six) hours as needed. (Patient not taking: Reported on 06/29/2015) 15 tablet 0   No current facility-administered medications on file prior to visit.    Allergies  Allergen Reactions  . Penicillins     REACTION: rash and itching    Family History  Problem Relation Age of Onset  . Diabetes Mother     Living  . Dementia Mother   .  Dementia Father     Deceased  . Diabetes Sister   . Healthy Son     x2  . Healthy Daughter     x1  . Colon cancer Neg Hx     History   Social History  . Marital Status: Married    Spouse Name: N/A  . Number of Children: 1  . 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 Smoking status: Never Smoker   . Smokeless tobacco: Never Used  . Alcohol Use: 0.6 oz/week    1 Glasses of wine per week  . Drug Use: No  . Sexual Activity: Not on file   Other Topics Concern  . None   Social History Narrative   Review of Systems - See HPI.  All other ROS are negative.  BP 137/80 mmHg  Pulse 88  Temp(Src) 99 F (37.2 C) (Oral)  Ht _0  (1.854 m)  Wt 215 lb 12.8 oz (97.886 kg)  BMI 28.48 kg/m2  SpO2 93%  Physical Exam  Constitutional: He is oriented to person, place, and time and well-developed, well-nourished, and in no distress.  HENT:  Head: Normocephalic and atraumatic.  Eyes: Conjunctivae are normal. Pupils are equal,  round, and reactive to light.  Neck: Neck supple.  Cardiovascular: Normal rate, regular rhythm, normal heart sounds and intact distal pulses.   Pulmonary/Chest: Effort normal and breath sounds normal.  Musculoskeletal: Normal range of motion.  Neurological: He is alert and oriented to person, place, and time. No cranial nerve deficit.  Skin: Skin is warm and dry. No rash noted.  Vitals reviewed.   No results found for this or any previous visit (from the past 2160 hour(s)).  Assessment/Plan: Fall at home All injuries have healed nicely. ROM of L shoulder full without pain. Supportive measures reviewed. Follow-up PRN.

## 2015-06-29 NOTE — Progress Notes (Signed)
Pre visit review using our clinic review tool, if applicable. No additional management support is needed unless otherwise documented below in the visit note. 

## 2015-07-15 ENCOUNTER — Telehealth: Payer: Self-pay | Admitting: Physician Assistant

## 2015-07-15 DIAGNOSIS — I10 Essential (primary) hypertension: Secondary | ICD-10-CM

## 2015-07-15 NOTE — Telephone Encounter (Signed)
Relation to pt: self Call back number: [phone] Pharmacy: [facility] [place], [place] [phone] (Phone) [phone] (Fax)         Reason for call:  Patient requesting a refill lisinopril (PRINIVIL,ZESTRIL) 10 MG tablet metFORMIN (GLUCOPHAGE-XR) 500 MG 24 hr tablet  omeprazole (PRILOSEC) 20 MG capsule  simvastatin (ZOCOR) 40 MG tablet

## 2015-07-16 MED ORDER — METFORMIN HCL ER 500 MG PO TB24: 500.0000 mg | ORAL_TABLET | Freq: Every day | ORAL | Status: DC

## 2015-07-16 MED ORDER — SIMVASTATIN 40 MG PO TABS: 40.0000 mg | ORAL_TABLET | Freq: Every day | ORAL | Status: DC

## 2015-07-16 MED ORDER — OMEPRAZOLE 20 MG PO CPDR: 20.0000 mg | DELAYED_RELEASE_CAPSULE | Freq: Every day | ORAL | Status: DC

## 2015-07-16 MED ORDER — LISINOPRIL 10 MG PO TABS: 10.0000 mg | ORAL_TABLET | Freq: Every day | ORAL | Status: DC

## 2015-07-16 NOTE — Telephone Encounter (Signed)
Medication refills sent to [facility].

## 2015-07-19 ENCOUNTER — Telehealth: Payer: Self-pay | Admitting: Physician Assistant

## 2015-07-19 ENCOUNTER — Other Ambulatory Visit: Payer: Self-pay | Admitting: Physician Assistant

## 2015-07-19 NOTE — Telephone Encounter (Signed)
Called and [facility] @ 1:59pm @ [phone]) asking the pt to RTC regarding med request.//AB/CMA

## 2015-07-19 NOTE — Telephone Encounter (Signed)
Relation to pt: self  Call back number: Pharmacy:  Reason for call:  Blood Glucose Monitoring Suppl (ACCU-CHEK AVIVA PLUS) W/DEVICE KIT : needle and test strips.

## 2015-07-20 MED ORDER — ACCU-CHEK SOFTCLIX LANCETS MISC: Status: DC

## 2015-07-20 NOTE — Addendum Note (Signed)
Addended by: Harl Bowie on: 07/20/2015 02:19 PM   Modules accepted: Orders

## 2015-07-20 NOTE — Telephone Encounter (Signed)
Pt wife called in. Pharmacy only recd RX for test strips. Pt is also need lancets. Please send Rx.

## 2015-07-20 NOTE — Telephone Encounter (Signed)
Rx for lancets phoned in to the pharmacy.//AB/CMA

## 2015-08-02 ENCOUNTER — Encounter: Payer: Self-pay | Admitting: Physician Assistant

## 2015-08-02 ENCOUNTER — Ambulatory Visit (INDEPENDENT_AMBULATORY_CARE_PROVIDER_SITE_OTHER): Payer: Commercial Managed Care - HMO | Admitting: Physician Assistant

## 2015-08-02 VITALS — BP 150/88 | HR 90 | Temp 98.0°F | Ht 73.0 in | Wt 219.2 lb

## 2015-08-02 DIAGNOSIS — M25511 Pain in right shoulder: Secondary | ICD-10-CM

## 2015-08-02 DIAGNOSIS — G8929 Other chronic pain: Secondary | ICD-10-CM

## 2015-08-02 NOTE — Patient Instructions (Signed)
Please avoid any heavy lifting or overhead motion of arm. You will be contacted by Dr. Ericka Pontiff office for an appointment. Continue your OTC pain medications as directed.

## 2015-08-02 NOTE — Progress Notes (Signed)
Patient presents to clinic today c/o continued R shoulder pain with difficulty with ROM. Endorses ROM has improved from initial visit in which patient was seen for fall after trauma. Denies numbness or tingling. Pain is of anterior shoulder. Patient endorses participating in resistance training at the gym.  Past Medical History  Diagnosis Date  . Allergic rhinitis   . Sinusitis   . Dyspnea   . Hypertension   . Hypercholesteremia   . Borderline diabetes mellitus   . Hiatal hernia   . GERD (gastroesophageal reflux disease)   . Peyronie's disease   . Lumbar back pain   . Chicken pox   . Diabetes mellitus without complication   . Melanoma 2014    shoulder  . 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 History of stomach ulcers     1985    Current Outpatient Prescriptions on File Prior to Visit  Medication Sig Dispense Refill  . ACCU-CHEK AVIVA PLUS test strip USE AS DIRECTED 100 each 5  . ACCU-CHEK SOFTCLIX LANCETS lancets Use as instructed 100 each 5  . aspirin 81 MG tablet Take 81 mg by mouth daily.     . Blood Glucose Monitoring Suppl (ACCU-CHEK AVIVA PLUS) W/DEVICE KIT Test blood sugar once daily 1 kit 0  . lisinopril (PRINIVIL,ZESTRIL) 10 MG tablet Take 1 tablet (10 mg total) by mouth daily. 90 tablet 1  . metFORMIN (GLUCOPHAGE-XR) 500 MG 24 hr tablet Take 1 tablet (500 mg total) by mouth daily with breakfast. 90 tablet 1  . omeprazole (PRILOSEC) 20 MG capsule Take 1 capsule (20 mg total) by mouth daily. 90 capsule 1  . simvastatin (ZOCOR) 40 MG tablet Take 1 tablet (40 mg total) by mouth at bedtime. 90 tablet 1  . traMADol (ULTRAM) 50 MG tablet Take 1 tablet (50 mg total) by mouth every 6 (six) hours as needed. 15 tablet 0   No current facility-administered medications on file prior to visit.    Allergies  Allergen Reactions  . Penicillins     REACTION: rash and itching    Family History  Problem Relation Age of Onset  . Diabetes Mother     Living  . Dementia  Mother   . Dementia Father     Deceased  . Diabetes Sister   . Healthy Son     x2  . Healthy Daughter     x1  . Colon cancer Neg Hx     History   Social History  . Marital Status: Married    Spouse Name: N/A  . Number of Children: 1  . 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 Smoking status: Never Smoker   . Smokeless tobacco: Never Used  . Alcohol Use: 0.6 oz/week    1 Glasses of wine per week  . Drug Use: No  . Sexual Activity: Not on file   Other Topics Concern  . None   Social History Narrative   Review of Systems - See HPI.  All other ROS are negative.  BP 150/88 mmHg  Pulse 90  Temp(Src) 98 F (36.7 C) (Oral)  Ht _0  (1.854 m)  Wt 219 lb 3.2 oz (99.428 kg)  BMI 28.93 kg/m2  SpO2 95%  Physical Exam  Constitutional: He is well-developed, well-nourished, and in no distress.  HENT:  Head: Normocephalic and atraumatic.  Pulmonary/Chest: Effort normal and breath sounds normal. No  respiratory distress. He has no wheezes. He has no rales. He exhibits no tenderness.  Musculoskeletal:       Right shoulder: He exhibits pain. He exhibits normal range of motion and normal strength.  Vitals reviewed.   No results found for this or any previous visit (from the past 2160 hour(s)).  Assessment/Plan: Chronic right shoulder pain Prior x-ray with arthritic changes noted. ROM present but limited due to pain. Pain with external rotation and internal rotation noted. Pain with abduction > 90 degrees noted. Will refer to Sports medicine for further assessment and potential [place]. Tramadol as directed. No resistance training until cleared by Sports medicine.

## 2015-08-02 NOTE — Assessment & Plan Note (Signed)
Prior x-ray with arthritic changes noted. ROM present but limited due to pain. Pain with external rotation and internal rotation noted. Pain with abduction > 90 degrees noted. Will refer to Sports medicine for further assessment and potential [place]. Tramadol as directed. No resistance training until cleared by Sports medicine.

## 2015-08-02 NOTE — Progress Notes (Signed)
Pre visit review using our clinic review tool, if applicable. No additional management support is needed unless otherwise documented below in the visit note. 

## 2015-08-03 ENCOUNTER — Ambulatory Visit: Payer: Self-pay | Admitting: Family Medicine

## 2015-08-04 ENCOUNTER — Ambulatory Visit (INDEPENDENT_AMBULATORY_CARE_PROVIDER_SITE_OTHER): Payer: Commercial Managed Care - HMO | Admitting: Family Medicine

## 2015-08-04 ENCOUNTER — Encounter: Payer: Self-pay | Admitting: Family Medicine

## 2015-08-04 VITALS — BP 124/82 | HR 89 | Ht 73.0 in | Wt 218.0 lb

## 2015-08-04 DIAGNOSIS — S4991XA Unspecified injury of right shoulder and upper arm, initial encounter: Secondary | ICD-10-CM | POA: Diagnosis not present

## 2015-08-04 MED ORDER — NITROGLYCERIN 0.2 MG/HR TD PT24: MEDICATED_PATCH | TRANSDERMAL | Status: DC

## 2015-08-04 NOTE — Patient Instructions (Signed)
You have strained your rotator cuff. Try to avoid painful activities (overhead activities, lifting with extended arm) as much as possible. Aleve 2 tabs twice a day with food OR ibuprofen 3 tabs three times a day with food for pain and inflammation. Can take tylenol in addition to this. Consider physical therapy with transition to home exercise program. Do home exercise program with theraband and scapular stabilization exercises daily - these are very important for long term relief - 3 sets of 10 once a day. Start nitro patches - 1/4th patch over affected area, change daily. If not improving at follow-up we will consider further imaging, physical therapy. Follow up with me in 1 month to 6 weeks.

## 2015-08-10 DIAGNOSIS — S4991XA Unspecified injury of right shoulder and upper arm, initial encounter: Secondary | ICD-10-CM | POA: Insufficient documentation

## 2015-08-10 NOTE — Assessment & Plan Note (Signed)
2/2 rotator cuff strain.  Excellent motion, strength.  Is improving.  Start with home exercises, nitro patches (discussed risks of headache, skin irritation), nsaids as needed.  F/u in 1 month to 6 weeks.  Consider PT, further imaging if not improving as expected.

## 2015-08-10 NOTE — Progress Notes (Signed)
PCP and referred by: Leeanne Rio, PA-C  Subjective:   HPI: Patient is a 74 y.o. male here for right shoulder pain.  Patient reports on 6/16 he was tangled up in a hose and fell down onto his right side. No prior problems with this shoulder. Not taken any medications for this. Pain when lifting overhead, reaching. No swelling or bruising. Is right handed. Feels like he is improving. Radiographs negative.  Past Medical History  Diagnosis Date  . Allergic rhinitis   . Sinusitis   . Dyspnea   . Hypertension   . Hypercholesteremia   . Borderline diabetes mellitus   . Hiatal hernia   . GERD (gastroesophageal reflux disease)   . Peyronie's disease   . Lumbar back pain   . Chicken pox   . Diabetes mellitus without complication   . Melanoma 2014    shoulder  . 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 History of stomach ulcers     1985    Current Outpatient Prescriptions on File Prior to Visit  Medication Sig Dispense Refill  . ACCU-CHEK AVIVA PLUS test strip USE AS DIRECTED 100 each 5  . ACCU-CHEK SOFTCLIX LANCETS lancets Use as instructed 100 each 5  . aspirin 81 MG tablet Take 81 mg by mouth daily.     . Blood Glucose Monitoring Suppl (ACCU-CHEK AVIVA PLUS) W/DEVICE KIT Test blood sugar once daily 1 kit 0  . lisinopril (PRINIVIL,ZESTRIL) 10 MG tablet Take 1 tablet (10 mg total) by mouth daily. 90 tablet 1  . metFORMIN (GLUCOPHAGE-XR) 500 MG 24 hr tablet Take 1 tablet (500 mg total) by mouth daily with breakfast. 90 tablet 1  . omeprazole (PRILOSEC) 20 MG capsule Take 1 capsule (20 mg total) by mouth daily. 90 capsule 1  . simvastatin (ZOCOR) 40 MG tablet Take 1 tablet (40 mg total) by mouth at bedtime. 90 tablet 1  . traMADol (ULTRAM) 50 MG tablet Take 1 tablet (50 mg total) by mouth every 6 (six) hours as needed. 15 tablet 0   No current facility-administered medications on file prior to visit.    Past Surgical History  Procedure Laterality Date  .  Cholecystectomy  02/1993    Dr. Harlow Asa  . Right hernia repair  2002    Dr. Deon Pilling  . Cataract extraction Bilateral 11/2009    Dr. Charise Killian  . Basal cell removed from face  12/2012  . Skin cancer removed from right shoulder  11/2013    melanoma    Allergies  Allergen Reactions  . Penicillins     REACTION: rash and itching    Social History   Social History  . Marital Status: Married    Spouse Name: N/A  . Number of Children: 1  . 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 Smoking status: Never Smoker   . Smokeless tobacco: Never Used  . Alcohol Use: 0.6 oz/week    1 Glasses of wine per week  . Drug Use: No  . Sexual Activity: Not on file   Other Topics Concern  . Not on file   Social History Narrative    Family History  Problem Relation Age of Onset  . Diabetes Mother     Living  . Dementia Mother   . Dementia Father     Deceased  . Diabetes Sister   . Healthy Son     x2  .  Healthy Daughter     x1  . Colon cancer Neg Hx     BP 124/82 mmHg  Pulse 89  Ht 6' 1" (1.854 m)  Wt 218 lb (98.884 kg)  BMI 28.77 kg/m2  Review of Systems: See HPI above.    Objective:  Physical Exam:  Gen: NAD  Right shoulder: No swelling, ecchymoses.  No gross deformity. No TTP AC joint, biceps tendon. FROM with mild painful arc. Negative Hawkins, Neers. Negative Speeds, Yergasons. Strength 5/5 with empty can and resisted internal/external rotation.  Mild pain empty can, ER Negative apprehension. NV intact distally.    Assessment & Plan:  1. Right shoulder injury - 2/2 rotator cuff strain.  Excellent motion, strength.  Is improving.  Start with home exercises, nitro patches (discussed risks of headache, skin irritation), nsaids as needed.  F/u in 1 month to 6 weeks.  Consider PT, further imaging if not improving as expected.

## 2015-08-11 ENCOUNTER — Telehealth: Payer: Self-pay | Admitting: Family Medicine

## 2015-08-11 NOTE — Telephone Encounter (Signed)
If the headaches are intolerable he can consider doing physical therapy as the next step instead.

## 2015-08-11 NOTE — Telephone Encounter (Signed)
Spoke to patient and told him that he could stop using the patches. Patient stated that he wants to do the HEP at this time.

## 2015-09-15 ENCOUNTER — Ambulatory Visit: Payer: Self-pay | Admitting: Family Medicine

## 2015-09-17 ENCOUNTER — Telehealth: Payer: Self-pay | Admitting: Physician Assistant

## 2015-09-17 NOTE — Telephone Encounter (Signed)
I sent a back dated referral to [facility], awaiting to see if they will do a 1 time backed dated [place]. Awaiting to hear from insurance

## 2015-09-17 NOTE — Telephone Encounter (Signed)
I am not sure how to do that -- we may need to call the Dermatology office or Insurance to ask how to proceed. If he needed a referral then the specialty office would not have seem him without a referral being in progress, unless they were not aware he changed insurances? Either way not sure what we can do at this point.

## 2015-09-17 NOTE — Telephone Encounter (Signed)
Howard Fisher, wife Ph# [phone]  Pt had appt 08/03/15 with Dr. Orlena Sheldon at [facility]. 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 She said that insurance denied because they did not have a referral/auth from PCP. They received a letter dated 08/26/15 stating they have 60 days from the date of letter to file an appeal. She would like to know what we can do to fix this issue and get insurance to pay. Please enter referral so the ref coordinators can work on [place].

## 2015-09-17 NOTE — Telephone Encounter (Signed)
[employer] referrals can't be backed dated. We never referred the patient to this office. This should had been patients responsibility and the office patient went too. I don't know how we can file a appeal if we never referred the patient. Please Howard Fisher

## 2015-09-20 NOTE — Telephone Encounter (Signed)
Error/gd °

## 2015-09-20 NOTE — Telephone Encounter (Signed)
Insurance will not back date referral, awaiting return call from patient

## 2015-09-20 NOTE — Telephone Encounter (Signed)
Pt wife aware

## 2015-09-29 ENCOUNTER — Telehealth: Payer: Self-pay | Admitting: Physician Assistant

## 2015-09-29 DIAGNOSIS — Z7689 Persons encountering health services in other specified circumstances: Secondary | ICD-10-CM

## 2015-09-29 NOTE — Telephone Encounter (Signed)
Caller Name: Tyrann Donaho  Relation to JJ:OACZYS  Call back [place]  Reason for call:  Requesting a referral to Dr. Orlena Sheldon at [facility]

## 2015-09-29 NOTE — Telephone Encounter (Signed)
Referral placed.

## 2015-12-02 ENCOUNTER — Other Ambulatory Visit: Payer: Self-pay | Admitting: Physician Assistant

## 2015-12-30 ENCOUNTER — Encounter: Payer: Self-pay | Admitting: Family Medicine

## 2015-12-30 ENCOUNTER — Ambulatory Visit (INDEPENDENT_AMBULATORY_CARE_PROVIDER_SITE_OTHER): Payer: Medicare HMO | Admitting: Family Medicine

## 2015-12-30 VITALS — BP 121/84 | HR 100 | Temp 98.4°F | Resp 20 | Wt 220.0 lb

## 2015-12-30 DIAGNOSIS — J0110 Acute frontal sinusitis, unspecified: Secondary | ICD-10-CM | POA: Insufficient documentation

## 2015-12-30 MED ORDER — AZITHROMYCIN 250 MG PO TABS: ORAL_TABLET | ORAL | Status: DC

## 2015-12-30 NOTE — Patient Instructions (Signed)
Sinusitis, Adult Sinusitis is redness, soreness, and inflammation of the paranasal sinuses. Paranasal sinuses are air pockets within the bones of your face. They are located beneath your eyes, in the middle of your forehead, and above your eyes. In healthy paranasal sinuses, mucus is able to drain out, and air is able to circulate through them by way of your nose. However, when your paranasal sinuses are inflamed, mucus and air can become trapped. This can allow bacteria and other germs to grow and cause infection. Sinusitis can develop quickly and last only a short time (acute) or continue over a long period (chronic). Sinusitis that lasts for more than 12 weeks is considered chronic. CAUSES Causes of sinusitis include:  Allergies.  Structural abnormalities, such as displacement of the cartilage that separates your nostrils (deviated septum), which can decrease the air flow through your nose and sinuses and affect sinus drainage.  Functional abnormalities, such as when the small hairs (cilia) that line your sinuses and help remove mucus do not work properly or are not present. SIGNS AND SYMPTOMS Symptoms of acute and chronic sinusitis are the same. The primary symptoms are pain and pressure around the affected sinuses. Other symptoms include:  Upper toothache.  Earache.  Headache.  Bad breath.  Decreased sense of smell and taste.  A cough, which worsens when you are lying flat.  Fatigue.  Fever.  Thick drainage from your nose, which often is green and may contain pus (purulent).  Swelling and warmth over the affected sinuses. DIAGNOSIS Your health care provider will perform a physical exam. During your exam, your health care provider may perform any of the following to help determine if you have acute sinusitis or chronic sinusitis:  Look in your nose for signs of abnormal growths in your nostrils (nasal polyps).  Tap over the affected sinus to check for signs of  infection.  View the inside of your sinuses using an imaging device that has a light attached (endoscope). If your health care provider suspects that you have chronic sinusitis, one or more of the following tests may be recommended:  Allergy tests.  Nasal culture. A sample of mucus is taken from your nose, sent to a lab, and screened for bacteria.  Nasal cytology. A sample of mucus is taken from your nose and examined by your health care provider to determine if your sinusitis is related to an allergy. TREATMENT Most cases of acute sinusitis are related to a viral infection and will resolve on their own within 10 days. Sometimes, medicines are prescribed to help relieve symptoms of both acute and chronic sinusitis. These may include pain medicines, decongestants, nasal steroid sprays, or saline sprays. However, for sinusitis related to a bacterial infection, your health care provider will prescribe antibiotic medicines. These are medicines that will help kill the bacteria causing the infection. Rarely, sinusitis is caused by a fungal infection. In these cases, your health care provider will prescribe antifungal medicine. For some cases of chronic sinusitis, surgery is needed. Generally, these are cases in which sinusitis recurs more than 3 times per year, despite other treatments. HOME CARE INSTRUCTIONS  Drink plenty of water. Water helps thin the mucus so your sinuses can drain more easily.  Use a humidifier.  Inhale steam 3-4 times a day (for example, sit in the bathroom with the shower running).  Apply a warm, moist washcloth to your face 3-4 times a day, or as directed by your health care provider.  Use saline nasal sprays to help   moisten and clean your sinuses.  Take medicines only as directed by your health care provider.  If you were prescribed either an antibiotic or antifungal medicine, finish it all even if you start to feel better. SEEK IMMEDIATE MEDICAL CARE IF:  You have  increasing pain or severe headaches.  You have nausea, vomiting, or drowsiness.  You have swelling around your face.  You have vision problems.  You have a stiff neck.  You have difficulty breathing.   This information is not intended to replace advice given to you by your health care provider. Make sure you discuss any questions you have with your health care provider.   Document Released: 12/11/2005 Document Revised: 01/01/2015 Document Reviewed: 12/26/2011 Elsevier Interactive Patient Education 2016 [place]- pack called in  nasal saline. Flonase. Humidifier. Mucinex (plain) Rest/hydrate.

## 2015-12-30 NOTE — Progress Notes (Signed)
Patient ID: Howard Fisher, male   DOB: [birth year], 75 y.o.   MRN: [id number]   Subjective:    Patient ID: Howard Fisher   DOB: April 14, 1941, 75 y.o.    MRN: [id number]  HPI  Cough: Patient presents with a four-day history of dry cough, rhinorrhea, nasal congestion, sore throat, not sleeping well at night secondary to cough, headache, fatigue, mild shortness of breath and chills. Patient states that his wife was ill last week. He has no asthma or COPD history. He is tolerating food and drink. He denies nausea, vomit, diarrhea or rash. OTC: alk cold night. Cold ease.  Tetanus up-to-date, flu shot up-to-date. Past Medical History  Diagnosis Date  . Allergic rhinitis   . Sinusitis   . Dyspnea   . Hypertension   . Hypercholesteremia   . Borderline diabetes mellitus   . Hiatal hernia   . GERD (gastroesophageal reflux disease)   . Peyronie's disease   . Lumbar back pain   . Chicken pox   . Diabetes mellitus without complication ([place])   . Melanoma ([place]) 2014    shoulder  . 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 History of stomach ulcers     1985   Allergies  Allergen Reactions  . Penicillins     REACTION: rash and itching   Past Surgical History  Procedure Laterality Date  . Cholecystectomy  02/1993    Dr. Harlow Asa  . Right hernia repair  2002    Dr. Deon Pilling  . Cataract extraction Bilateral 11/2009    Dr. Charise Killian  . Basal cell removed from face  12/2012  . Skin cancer removed from right shoulder  11/2013    melanoma   Social History  Substance Use Topics  . Smoking status: Never Smoker   . Smokeless tobacco: Never Used  . Alcohol Use: 0.6 oz/week    1 Glasses of wine per week    Review of Systems Negative, with the exception of above mentioned in HPI     Objective:   Physical Exam BP 121/84 mmHg  Pulse 100  Temp(Src) 98.4 F (36.9 C)  Resp 20  Wt 220 lb (99.791 kg)  SpO2 95% Body mass index is 29.03 kg/(m^2). Gen: Afebrile. No acute distress. Nontoxic in  appearance. Well developed, well nourished, pleasant, Caucasian male. HENT: AT. [place]. Bilateral TM visualized, no erythema or bulging. MMM, no oral lesions. Bilateral nares severe erythema, swelling, areas of excoriation, dried blood. Throat mild erythema, no exudates. Cough present, Hoarseness present, not tender over facial sinuses. Eyes:Pupils Equal Round Reactive to light, Extraocular movements intact,  Conjunctiva without redness, discharge or icterus. Neck/lymp/endocrine: Supple, shotty anterior cervical lymphadenopathy CV: RRR  Chest: CTAB, no wheeze or crackles Abd: Soft. NTND. BS present Skin: No rashes, purpura or petechiae.  Assessment & Plan:  Howard Fisher is a 75 y.o. present for acute OV with  1. Acute frontal sinusitis, recurrence not specified - Z-Pak, nasal saline, Flonase, Mucinex (plain). - Use of humidifier at night - Rest and maintain hydration. Dehydration precautions discussed. - azithromycin (ZITHROMAX Z-PAK) 250 MG tablet; 500 mg day1, then 250 mg  Dispense: 6 each; Refill: 0 - AVS on sinusitis provided. - Patient to follow-up with primary care provider if no improvement within one week.

## 2016-01-18 ENCOUNTER — Encounter: Payer: Self-pay | Admitting: General Practice

## 2016-01-19 ENCOUNTER — Encounter: Payer: Self-pay | Admitting: Physician Assistant

## 2016-01-19 ENCOUNTER — Ambulatory Visit (INDEPENDENT_AMBULATORY_CARE_PROVIDER_SITE_OTHER): Payer: Medicare HMO | Admitting: Physician Assistant

## 2016-01-19 VITALS — BP 132/86 | HR 85 | Temp 98.2°F | Ht 73.0 in | Wt 221.2 lb

## 2016-01-19 DIAGNOSIS — Z125 Encounter for screening for malignant neoplasm of prostate: Secondary | ICD-10-CM

## 2016-01-19 DIAGNOSIS — Z0000 Encounter for general adult medical examination without abnormal findings: Secondary | ICD-10-CM

## 2016-01-19 DIAGNOSIS — Z23 Encounter for immunization: Secondary | ICD-10-CM | POA: Diagnosis not present

## 2016-01-19 DIAGNOSIS — E119 Type 2 diabetes mellitus without complications: Secondary | ICD-10-CM

## 2016-01-19 LAB — URINALYSIS, ROUTINE W REFLEX MICROSCOPIC
BILIRUBIN URINE: NEGATIVE
Hgb urine dipstick: NEGATIVE
KETONES UR: NEGATIVE
Leukocytes, UA: NEGATIVE
Nitrite: NEGATIVE
RBC / HPF: NONE SEEN (ref 0–?)
Specific Gravity, Urine: >=1.03 — AB (ref 1.000–1.030)
TOTAL PROTEIN, URINE-UPE24: NEGATIVE
URINE GLUCOSE: NEGATIVE
UROBILINOGEN UA: 0.2 (ref 0.0–1.0)
pH: 5.5 (ref 5.0–8.0)

## 2016-01-19 LAB — COMPREHENSIVE METABOLIC PANEL
ALK PHOS: 50 U/L (ref 39–117)
ALT: 30 U/L (ref 0–53)
AST: 27 U/L (ref 0–37)
Albumin: 4.4 g/dL (ref 3.5–5.2)
BUN: 13 mg/dL (ref 6–23)
CALCIUM: 9.4 mg/dL (ref 8.4–10.5)
CO2: 29 mEq/L (ref 19–32)
Chloride: 105 mEq/L (ref 96–112)
Creatinine, Ser: 1.12 mg/dL (ref 0.40–1.50)
GFR: 67.79 mL/min (ref >60.00)
Glucose, Bld: 127 mg/dL — ABNORMAL HIGH (ref 70–99)
POTASSIUM: 4.1 meq/L (ref 3.5–5.1)
Sodium: 141 mEq/L (ref 135–145)
TOTAL PROTEIN: 6.8 g/dL (ref 6.0–8.3)
Total Bilirubin: 0.7 mg/dL (ref 0.2–1.2)

## 2016-01-19 LAB — CBC
HEMATOCRIT: 49.8 % (ref 39.0–52.0)
HEMOGLOBIN: 16.6 g/dL (ref 13.0–17.0)
MCHC: 33.3 g/dL (ref 30.0–36.0)
MCV: 95.7 fl (ref 78.0–100.0)
PLATELETS: 208 10*3/uL (ref 150.0–400.0)
RBC: 5.21 Mil/uL (ref 4.22–5.81)
RDW: 13.4 % (ref 11.5–15.5)
WBC: 7.4 10*3/uL (ref 4.0–10.5)

## 2016-01-19 LAB — PSA, MEDICARE: PSA: 0.98 ng/mL (ref 0.10–4.00)

## 2016-01-19 LAB — TSH: TSH: 2.52 u[IU]/mL (ref 0.35–4.50)

## 2016-01-19 LAB — LIPID PANEL
Cholesterol: 138 mg/dL (ref 0–200)
HDL: 42.7 mg/dL (ref >39.00)
LDL Cholesterol: 60 mg/dL (ref 0–99)
NONHDL: 95.21
Total CHOL/HDL Ratio: 3
Triglycerides: 176 mg/dL — ABNORMAL HIGH (ref 0.0–149.0)
VLDL: 35.2 mg/dL (ref 0.0–40.0)

## 2016-01-19 LAB — HEMOGLOBIN A1C: Hgb A1c MFr Bld: 6.1 % (ref 4.6–6.5)

## 2016-01-19 MED ORDER — LISINOPRIL 10 MG PO TABS: 10.0000 mg | ORAL_TABLET | Freq: Every day | ORAL | Status: DC

## 2016-01-19 MED ORDER — METFORMIN HCL ER 500 MG PO TB24: 500.0000 mg | ORAL_TABLET | Freq: Every day | ORAL | Status: DC

## 2016-01-19 MED ORDER — OMEPRAZOLE 20 MG PO CPDR: 20.0000 mg | DELAYED_RELEASE_CAPSULE | Freq: Every day | ORAL | Status: DC

## 2016-01-19 MED ORDER — SIMVASTATIN 40 MG PO TABS: 40.0000 mg | ORAL_TABLET | Freq: Every day | ORAL | Status: DC

## 2016-01-19 NOTE — Progress Notes (Signed)
Pre visit review using our clinic review tool, if applicable. No additional management support is needed unless otherwise documented below in the visit note. 

## 2016-01-19 NOTE — Patient Instructions (Signed)
Please go to the lab for blood work. I will call with your results.  Continue medications as directed. I have sent prescriptions to the [facility] mail order pharmacy. They have been instructed to hold medicines until you request them. The number is [phone].   Please schedule an appointment with your Ophthalmologist for a diabetic eye exam.  Preventive Care for Adults, Male A healthy lifestyle and preventive care can promote health and wellness. Preventive health guidelines for men include the following key practices:  A routine yearly physical is a good way to check with your health care provider about your health and preventative screening. It is a chance to share any concerns and updates on your health and to receive a thorough exam.  Visit your dentist for a routine exam and preventative care every 6 months. Brush your teeth twice a day and floss once a day. Good oral hygiene prevents tooth decay and gum disease.  The frequency of eye exams is based on your age, health, family medical history, use of contact lenses, and other factors. Follow your health care provider's recommendations for frequency of eye exams.  Eat a healthy diet. Foods such as vegetables, fruits, whole grains, low-fat dairy products, and lean protein foods contain the nutrients you need without too many calories. Decrease your intake of foods high in solid fats, added sugars, and salt. Eat the right amount of calories for you.Get information about a proper diet from your health care provider, if necessary.  Regular physical exercise is one of the most important things you can do for your health. Most adults should get at least 150 minutes of moderate-intensity exercise (any activity that increases your heart rate and causes you to sweat) each week. In addition, most adults need muscle-strengthening exercises on 2 or more days a week.  Maintain a healthy weight. The body mass index (BMI) is a screening tool to identify  possible weight problems. It provides an estimate of body fat based on height and weight. Your health care provider can find your BMI and can help you achieve or maintain a healthy weight.For adults 20 years and older:  A BMI below 18.5 is considered underweight.  A BMI of 18.5 to 24.9 is normal.  A BMI of 25 to 29.9 is considered overweight.  A BMI of 30 and above is considered obese.  Maintain normal blood lipids and cholesterol levels by exercising and minimizing your intake of saturated fat. Eat a balanced diet with plenty of fruit and vegetables. Blood tests for lipids and cholesterol should begin at age 44 and be repeated every 5 years. If your lipid or cholesterol levels are high, you are over 50, or you are at high risk for heart disease, you may need your cholesterol levels checked more frequently.Ongoing high lipid and cholesterol levels should be treated with medicines if diet and exercise are not working.  If you smoke, find out from your health care provider how to quit. If you do not use tobacco, do not start.  Lung cancer screening is recommended for adults aged 49-80 years who are at high risk for developing lung cancer because of a history of smoking. A yearly low-dose CT scan of the lungs is recommended for people who have at least a 30-pack-year history of smoking and are a current smoker or have quit within the past 15 years. A pack year of smoking is smoking an average of 1 pack of cigarettes a day for 1 year (for example: 1 pack a  day for 30 years or 2 packs a day for 15 years). Yearly screening should continue until the smoker has stopped smoking for at least 15 years. Yearly screening should be stopped for people who develop a health problem that would prevent them from having lung cancer treatment.  If you choose to drink alcohol, do not have more than 2 drinks per day. One drink is considered to be 12 ounces (355 mL) of beer, 5 ounces (148 mL) of wine, or 1.5 ounces (44  mL) of liquor.  Avoid use of street drugs. Do not share needles with anyone. Ask for help if you need support or instructions about stopping the use of drugs.  High blood pressure causes heart disease and increases the risk of stroke. Your blood pressure should be checked at least every 1-2 years. Ongoing high blood pressure should be treated with medicines, if weight loss and exercise are not effective.  If you are 45-5 years old, ask your health care provider if you should take aspirin to prevent heart disease.  Diabetes screening is done by taking a blood sample to check your blood glucose level after you have not eaten for a certain period of time (fasting). If you are not overweight and you do not have risk factors for diabetes, you should be screened once every 3 years starting at age 36. If you are overweight or obese and you are 46-65 years of age, you should be screened for diabetes every year as part of your cardiovascular risk assessment.  Colorectal cancer can be detected and often prevented. Most routine colorectal cancer screening begins at the age of 37 and continues through age 56. However, your health care provider may recommend screening at an earlier age if you have risk factors for colon cancer. On a yearly basis, your health care provider may provide home test kits to check for hidden blood in the stool. Use of a small camera at the end of a tube to directly examine the colon (sigmoidoscopy or colonoscopy) can detect the earliest forms of colorectal cancer. Talk to your health care provider about this at age 35, when routine screening begins. Direct exam of the colon should be repeated every 5-10 years through age 5, unless early forms of precancerous polyps or small growths are found.  People who are at an increased risk for hepatitis B should be screened for this virus. You are considered at high risk for hepatitis B if:  You were born in a country where hepatitis B occurs  often. Talk with your health care provider about which countries are considered high risk.  Your parents were born in a high-risk country and you have not received a shot to protect against hepatitis B (hepatitis B vaccine).  You have HIV or AIDS.  You use needles to inject street drugs.  You live with, or have sex with, someone who has hepatitis B.  You are a man who has sex with other men (MSM).  You get hemodialysis treatment.  You take certain medicines for conditions such as cancer, organ transplantation, and autoimmune conditions.  Hepatitis C blood testing is recommended for all people born from [birth year] through 1965 and any individual with known risks for hepatitis C.  Practice safe sex. Use condoms and avoid high-risk sexual practices to reduce the spread of sexually transmitted infections (STIs). STIs include gonorrhea, chlamydia, syphilis, trichomonas, herpes, HPV, and human immunodeficiency virus (HIV). Herpes, HIV, and HPV are viral illnesses that have no cure. They can  result in disability, cancer, and death.  If you are a man who has sex with other men, you should be screened at least once per year for:  HIV.  Urethral, rectal, and pharyngeal infection of gonorrhea, chlamydia, or both.  If you are at risk of being infected with HIV, it is recommended that you take a prescription medicine daily to prevent HIV infection. This is called preexposure prophylaxis (PrEP). You are considered at risk if:  You are a man who has sex with other men (MSM) and have other risk factors.  You are a heterosexual man, are sexually active, and are at increased risk for HIV infection.  You take drugs by injection.  You are sexually active with a partner who has HIV.  Talk with your health care provider about whether you are at high risk of being infected with HIV. If you choose to begin PrEP, you should first be tested for HIV. You should then be tested every 3 months for as long as you are  taking PrEP.  A one-time screening for abdominal aortic aneurysm (AAA) and surgical repair of large AAAs by ultrasound are recommended for men ages 62 to 21 years who are current or former smokers.  Healthy men should no longer receive prostate-specific antigen (PSA) blood tests as part of routine cancer screening. Talk with your health care provider about prostate cancer screening.  Testicular cancer screening is not recommended for adult males who have no symptoms. Screening includes self-exam, a health care provider exam, and other screening tests. Consult with your health care provider about any symptoms you have or any concerns you have about testicular cancer.  Use sunscreen. Apply sunscreen liberally and repeatedly throughout the day. You should seek shade when your shadow is shorter than you. Protect yourself by wearing long sleeves, pants, a wide-brimmed hat, and sunglasses year round, whenever you are outdoors.  Once a month, do a whole-body skin exam, using a mirror to look at the skin on your back. Tell your health care provider about new moles, moles that have irregular borders, moles that are larger than a pencil eraser, or moles that have changed in shape or color.  Stay current with required vaccines (immunizations).  Influenza vaccine. All adults should be immunized every year.  Tetanus, diphtheria, and acellular pertussis (Td, Tdap) vaccine. An adult who has not previously received Tdap or who does not know his vaccine status should receive 1 dose of Tdap. This initial dose should be followed by tetanus and diphtheria toxoids (Td) booster doses every 10 years. Adults with an unknown or incomplete history of completing a 3-dose immunization series with Td-containing vaccines should begin or complete a primary immunization series including a Tdap dose. Adults should receive a Td booster every 10 years.  Varicella vaccine. An adult without evidence of immunity to varicella should  receive 2 doses or a second dose if he has previously received 1 dose.  Human papillomavirus (HPV) vaccine. Males aged 11-21 years who have not received the vaccine previously should receive the 3-dose series. Males aged 22-26 years may be immunized. Immunization is recommended through the age of 9 years for any male who has sex with males and did not get any or all doses earlier. Immunization is recommended for any person with an immunocompromised condition through the age of 45 years if he did not get any or all doses earlier. During the 3-dose series, the second dose should be obtained 4-8 weeks after the first dose. The third  dose should be obtained 24 weeks after the first dose and 16 weeks after the second dose.  Zoster vaccine. One dose is recommended for adults aged 67 years or older unless certain conditions are present.  Measles, mumps, and rubella (MMR) vaccine. Adults born before [birth year] generally are considered immune to measles and mumps. Adults born in [birth year] or later should have 1 or more doses of MMR vaccine unless there is a contraindication to the vaccine or there is laboratory evidence of immunity to each of the three diseases. A routine second dose of MMR vaccine should be obtained at least 28 days after the first dose for students attending postsecondary schools, health care workers, or international travelers. People who received inactivated measles vaccine or an unknown type of measles vaccine during 1963-1967 should receive 2 doses of MMR vaccine. People who received inactivated mumps vaccine or an unknown type of mumps vaccine before 1979 and are at high risk for mumps infection should consider immunization with 2 doses of MMR vaccine. Unvaccinated health care workers born before [birth year] who lack laboratory evidence of measles, mumps, or rubella immunity or laboratory confirmation of disease should consider measles and mumps immunization with 2 doses of MMR vaccine or rubella immunization  with 1 dose of MMR vaccine.  Pneumococcal 13-valent conjugate (PCV13) vaccine. When indicated, a person who is uncertain of his immunization history and has no record of immunization should receive the PCV13 vaccine. All adults 73 years of age and older should receive this vaccine. An adult aged 54 years or older who has certain medical conditions and has not been previously immunized should receive 1 dose of PCV13 vaccine. This PCV13 should be followed with a dose of pneumococcal polysaccharide (PPSV23) vaccine. Adults who are at high risk for pneumococcal disease should obtain the PPSV23 vaccine at least 8 weeks after the dose of PCV13 vaccine. Adults older than 76 years of age who have normal immune system function should obtain the PPSV23 vaccine dose at least 1 year after the dose of PCV13 vaccine.  Pneumococcal polysaccharide (PPSV23) vaccine. When PCV13 is also indicated, PCV13 should be obtained first. All adults aged 39 years and older should be immunized. An adult younger than age 12 years who has certain medical conditions should be immunized. Any person who resides in a nursing home or long-term care facility should be immunized. An adult smoker should be immunized. People with an immunocompromised condition and certain other conditions should receive both PCV13 and PPSV23 vaccines. People with human immunodeficiency virus (HIV) infection should be immunized as soon as possible after diagnosis. Immunization during chemotherapy or radiation therapy should be avoided. Routine use of PPSV23 vaccine is not recommended for American Indians, [place] Natives, or people younger than 65 years unless there are medical conditions that require PPSV23 vaccine. When indicated, people who have unknown immunization and have no record of immunization should receive PPSV23 vaccine. One-time revaccination 5 years after the first dose of PPSV23 is recommended for people aged 19-64 years who have chronic kidney failure,  nephrotic syndrome, asplenia, or immunocompromised conditions. People who received 1-2 doses of PPSV23 before age 25 years should receive another dose of PPSV23 vaccine at age 62 years or later if at least 5 years have passed since the previous dose. Doses of PPSV23 are not needed for people immunized with PPSV23 at or after age 35 years.  Meningococcal vaccine. Adults with asplenia or persistent complement component deficiencies should receive 2 doses of quadrivalent meningococcal conjugate (MenACWY-D) vaccine. The doses  should be obtained at least 2 months apart. Microbiologists working with certain meningococcal bacteria, [place] recruits, people at risk during an outbreak, and people who travel to or live in countries with a high rate of meningitis should be immunized. A first-year college student up through age 7 years who is living in a residence hall should receive a dose if he did not receive a dose on or after his 16th birthday. Adults who have certain high-risk conditions should receive one or more doses of vaccine.  Hepatitis A vaccine. Adults who wish to be protected from this disease, have chronic liver disease, work with hepatitis A-infected animals, work in hepatitis A research labs, or travel to or work in countries with a high rate of hepatitis A should be immunized. Adults who were previously unvaccinated and who anticipate close contact with an international adoptee during the first 60 days after arrival in the [place] from a country with a high rate of hepatitis A should be immunized.  Hepatitis B vaccine. Adults should be immunized if they wish to be protected from this disease, are under age 58 years and have diabetes, have chronic liver disease, have had more than one sex partner in the past 6 months, may be exposed to blood or other infectious body fluids, are household contacts or sex partners of hepatitis B positive people, are clients or workers in certain care facilities, or  travel to or work in countries with a high rate of hepatitis B.  Haemophilus influenzae type b (Hib) vaccine. A previously unvaccinated person with asplenia or sickle cell disease or having a scheduled splenectomy should receive 1 dose of Hib vaccine. Regardless of previous immunization, a recipient of a hematopoietic stem cell transplant should receive a 3-dose series 6-12 months after his successful transplant. Hib vaccine is not recommended for adults with HIV infection. Preventive Service / Frequency Ages 26 to 80  Blood pressure check.** / Every 3-5 years.  Lipid and cholesterol check.** / Every 5 years beginning at age 51.  Hepatitis C blood test.** / For any individual with known risks for hepatitis C.  Skin self-exam. / Monthly.  Influenza vaccine. / Every year.  Tetanus, diphtheria, and acellular pertussis (Tdap, Td) vaccine.** / Consult your health care provider. 1 dose of Td every 10 years.  Varicella vaccine.** / Consult your health care provider.  HPV vaccine. / 3 doses over 6 months, if 60 or younger.  Measles, mumps, rubella (MMR) vaccine.** / You need at least 1 dose of MMR if you were born in 1957 or later. You may also need a second dose.  Pneumococcal 13-valent conjugate (PCV13) vaccine.** / Consult your health care provider.  Pneumococcal polysaccharide (PPSV23) vaccine.** / 1 to 2 doses if you smoke cigarettes or if you have certain conditions.  Meningococcal vaccine.** / 1 dose if you are age 72 to 29 years and a [employer] researcher living in a residence hall, or have one of several medical conditions. You may also need additional booster doses.  Hepatitis A vaccine.** / Consult your health care provider.  Hepatitis B vaccine.** / Consult your health care provider.  Haemophilus influenzae type b (Hib) vaccine.** / Consult your health care provider. Ages 30 to 13  Blood pressure check.** / Every year.  Lipid and cholesterol check.** / Every 5 years  beginning at age 87.  Lung cancer screening. / Every year if you are aged 75-80 years and have a 30-pack-year history of smoking and currently smoke or have quit  within the past 15 years. Yearly screening is stopped once you have quit smoking for at least 15 years or develop a health problem that would prevent you from having lung cancer treatment.  Fecal occult blood test (FOBT) of stool. / Every year beginning at age 83 and continuing until age 31. You may not have to do this test if you get a colonoscopy every 10 years.  Flexible sigmoidoscopy** or colonoscopy.** / Every 5 years for a flexible sigmoidoscopy or every 10 years for a colonoscopy beginning at age 69 and continuing until age 40.  Hepatitis C blood test.** / For all people born from [birth year] through 1965 and any individual with known risks for hepatitis C.  Skin self-exam. / Monthly.  Influenza vaccine. / Every year.  Tetanus, diphtheria, and acellular pertussis (Tdap/Td) vaccine.** / Consult your health care provider. 1 dose of Td every 10 years.  Varicella vaccine.** / Consult your health care provider.  Zoster vaccine.** / 1 dose for adults aged 29 years or older.  Measles, mumps, rubella (MMR) vaccine.** / You need at least 1 dose of MMR if you were born in 1958 or later. You may also need a second dose.  Pneumococcal 13-valent conjugate (PCV13) vaccine.** / Consult your health care provider.  Pneumococcal polysaccharide (PPSV23) vaccine.** / 1 to 2 doses if you smoke cigarettes or if you have certain conditions.  Meningococcal vaccine.** / Consult your health care provider.  Hepatitis A vaccine.** / Consult your health care provider.  Hepatitis B vaccine.** / Consult your health care provider.  Haemophilus influenzae type b (Hib) vaccine.** / Consult your health care provider. Ages 79 and over  Blood pressure check.** / Every year.  Lipid and cholesterol check.**/ Every 5 years beginning at age 23.  Lung cancer  screening. / Every year if you are aged 57-80 years and have a 30-pack-year history of smoking and currently smoke or have quit within the past 15 years. Yearly screening is stopped once you have quit smoking for at least 15 years or develop a health problem that would prevent you from having lung cancer treatment.  Fecal occult blood test (FOBT) of stool. / Every year beginning at age 73 and continuing until age 4. You may not have to do this test if you get a colonoscopy every 10 years.  Flexible sigmoidoscopy** or colonoscopy.** / Every 5 years for a flexible sigmoidoscopy or every 10 years for a colonoscopy beginning at age 95 and continuing until age 18.  Hepatitis C blood test.** / For all people born from [birth year] through 1966 and any individual with known risks for hepatitis C.  Abdominal aortic aneurysm (AAA) screening.** / A one-time screening for ages 59 to 64 years who are current or former smokers.  Skin self-exam. / Monthly.  Influenza vaccine. / Every year.  Tetanus, diphtheria, and acellular pertussis (Tdap/Td) vaccine.** / 1 dose of Td every 10 years.  Varicella vaccine.** / Consult your health care provider.  Zoster vaccine.** / 1 dose for adults aged 44 years or older.  Pneumococcal 13-valent conjugate (PCV13) vaccine.** / 1 dose for all adults aged 40 years and older.  Pneumococcal polysaccharide (PPSV23) vaccine.** / 1 dose for all adults aged 80 years and older.  Meningococcal vaccine.** / Consult your health care provider.  Hepatitis A vaccine.** / Consult your health care provider.  Hepatitis B vaccine.** / Consult your health care provider.  Haemophilus influenzae type b (Hib) vaccine.** / Consult your health care provider. **Family history and personal  history of risk and conditions may change your health care provider's recommendations.   This information is not intended to replace advice given to you by your health care provider. Make sure you discuss any  questions you have with your health care provider.   Document Released: 02/06/2002 Document Revised: 01/01/2015 Document Reviewed: 05/08/2011 Elsevier Interactive Patient Education [employer].

## 2016-01-19 NOTE — Progress Notes (Signed)
Subjective:    Howard Fisher is a 75 y.o. male who presents for Medicare Annual/Subsequent preventive examination.   Preventive Screening-Counseling & Management  Tobacco History  Smoking status  . Never Smoker   Smokeless tobacco  . Never Used    Problems Prior to Visit Hypertension -- patient currently on lisinopril 10 mg daily. Patient denies chest pain, palpitations, lightheadedness, dizziness, vision changes or frequent headaches.  BP Readings from Last 3 Encounters:  01/19/16 132/86  12/30/15 121/84  08/04/15 124/82   Hyperlipidemia -- Patient currently controlled with simvastatin 40 mg daily. Denies myalgias with medication. Is due for repeat lipid panel and LFT. Continues an 81 mg ASA daily.  DM II, controlled without complications -- Is taking Metformin daily as directed. Endorses checking fasting sugars twice weekly with number ranging 100-110.  Is due for foot exam and due to schedule yearly diabetic eye exam with his ophthalmologist.  Current Problems (verified) Patient Active Problem List   Diagnosis Date Noted  . Acute frontal sinusitis 12/30/2015  . Right shoulder injury 08/10/2015  . Chronic right shoulder pain 08/02/2015  . Fall at home 06/14/2015  . Sciatica 04/01/2015  . Controlled diabetes mellitus type II without complication ([place]) 81/09/3158  . Colon cancer screening 04/21/2014  . Dyspnea 06/12/2013  . Overweight(278.02) 01/02/2013  . HYPERTENSION 03/25/2009  . HIATAL HERNIA 12/17/2008  . 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 HYPERCHOLESTEROLEMIA 12/16/2008  . ALLERGIC RHINITIS 12/16/2008  . GERD 12/16/2008  . BACK PAIN, LUMBAR 12/16/2008    Medications Prior to Visit Current Outpatient Prescriptions on File Prior to Visit  Medication Sig Dispense Refill  . ACCU-CHEK AVIVA PLUS test strip USE AS DIRECTED 100 each 5  . ACCU-CHEK SOFTCLIX LANCETS lancets Use as instructed 100 each 5  . aspirin 81 MG tablet Take 81 mg by mouth daily.     . Blood  Glucose Monitoring Suppl (ACCU-CHEK AVIVA PLUS) W/DEVICE KIT Test blood sugar once daily 1 kit 0   No current facility-administered medications on file prior to visit.    Current Medications (verified) Current Outpatient Prescriptions  Medication Sig Dispense Refill  . ACCU-CHEK AVIVA PLUS test strip USE AS DIRECTED 100 each 5  . ACCU-CHEK SOFTCLIX LANCETS lancets Use as instructed 100 each 5  . aspirin 81 MG tablet Take 81 mg by mouth daily.     . Blood Glucose Monitoring Suppl (ACCU-CHEK AVIVA PLUS) W/DEVICE KIT Test blood sugar once daily 1 kit 0  . lisinopril (PRINIVIL,ZESTRIL) 10 MG tablet Take 1 tablet (10 mg total) by mouth daily. . 90 tablet 1  . metFORMIN (GLUCOPHAGE-XR) 500 MG 24 hr tablet Take 1 tablet (500 mg total) by mouth daily with breakfast. 90 tablet 1  . omeprazole (PRILOSEC) 20 MG capsule Take 1 capsule (20 mg total) by mouth daily. 90 capsule 1  . simvastatin (ZOCOR) 40 MG tablet Take 1 tablet (40 mg total) by mouth at bedtime. 90 tablet 1   No current facility-administered medications for this visit.     Allergies (verified) Penicillins   PAST HISTORY  Family History Family History  Problem Relation Age of Onset  . Diabetes Mother     Living  . Dementia Mother   . Dementia Father     Deceased  . Diabetes Sister   . Healthy Son     x2  . Healthy Daughter     x1  . Colon cancer Neg Hx     Social History Social History  Substance Use Topics  .  Smoking status: Never Smoker   . Smokeless tobacco: Never Used  . Alcohol Use: 0.6 oz/week    1 Glasses of wine per week   Are there smokers in your home (other than you)?  No  Risk Factors Current exercise habits: Gym/ health club routine includes cardio, light weights, walking on track  and yoga.  Dietary issues discussed: Body mass index is 29.2 kg/(m^2). Endorses a well-balanced diet.   Cardiac risk factors: advanced age (older than 8 for men, 73 for women), diabetes mellitus, dyslipidemia and male  gender.  Depression Screen (Note: if answer to either of the following is "Yes", a more complete depression screening is indicated)   Q1: Over the past two weeks, have you felt down, depressed or hopeless? No  Q2: Over the past two weeks, have you felt little interest or pleasure in doing things? No  Have you lost interest or pleasure in daily life? No  Do you often feel hopeless? No  Do you cry easily over simple problems? No  Activities of Daily Living In your present state of health, do you have any difficulty performing the following activities?:  Driving? No Managing money?  No Feeding yourself? No Getting from bed to chair? No Climbing a flight of stairs? No Preparing food and eating?: No Bathing or showering? No Getting dressed: No Getting to the toilet? No Using the toilet:No Moving around from place to place: No In the past year have you fallen or had a near fall?:Yes -- tripped. Patient has been assessed for this previously.   Are you sexually active?  Yes  Do you have more than one partner?  N/A  Hearing Difficulties: No Do you often ask people to speak up or repeat themselves? No Do you experience ringing or noises in your ears? No Do you have difficulty understanding soft or whispered voices? No   Do you feel that you have a problem with memory? No  Do you often misplace items? No  Do you feel safe at home?  Yes  Cognitive Testing  Alert? Yes  Normal Appearance?Yes  Oriented to person? Yes  Place? Yes   Time? Yes  Recall of three objects?  Yes  Can perform simple calculations? Yes  Displays appropriate judgment?Yes    Advanced Directives have been discussed with the patient? Yes   List the Names of Other Physician/Practitioners you currently use: See EMR for care team  Indicate any recent Medical Services you may have received from other than [facility] providers in the past year (date may be approximate).  Immunization History  Administered Date(s)  Administered  . Pneumococcal Conjugate-13 01/17/2015  . Pneumococcal Polysaccharide-23 01/19/2016  . Tdap 01/03/2012    Screening Tests Health Maintenance  Topic Date Due  . OPHTHALMOLOGY EXAM  10/27/2015  . FOOT EXAM  12/24/2015  . INFLUENZA VACCINE  09/24/2016 (Originally 07/26/2015)  . ZOSTAVAX  09/24/2016 (Originally 04/07/2000)  . HEMOGLOBIN A1C  07/18/2016  . COLONOSCOPY  07/02/2019  . DTaP/Tdap/Td (2 - Td) 01/02/2022  . TETANUS/TDAP  01/02/2022  . PNA vac Low Risk Adult  Completed    All answers were reviewed with the patient and necessary referrals were made:  Leeanne Rio, PA-C   01/24/2016   History reviewed: allergies, current medications, past family history, past medical history, past social history, past surgical history and problem list  Review of Systems Pertinent items noted in HPI and remainder of comprehensive ROS otherwise negative.    Objective:  Blood pressure 132/86, pulse 85, temperature 98.2 F (36.8 C), temperature source Oral, height _0  (1.854 m), weight 221 lb 4 oz (100.358 kg), SpO2 93 %. Body mass index is 29.2 kg/(m^2).  General appearance: alert, cooperative, appears stated age and no distress Head: Normocephalic, without obvious abnormality, atraumatic Eyes: conjunctivae/corneas clear. PERRL, EOM's intact. Fundi benign. Ears: normal TM's and external ear canals both ears Nose: Nares normal. Septum midline. Mucosa normal. No drainage or sinus tenderness. Throat: lips, mucosa, and tongue normal; teeth and gums normal Lungs: clear to auscultation bilaterally Heart: regular rate and rhythm, S1, S2 normal, no murmur, click, rub or gallop Abdomen: soft, non-tender; bowel sounds normal; no masses,  no organomegaly Pulses: 2+ and symmetric Lymph nodes: Cervical, supraclavicular, and axillary nodes normal. Neurologic: Alert and oriented X 3, normal strength and tone. Normal symmetric reflexes. Normal coordination and gait    Diabetic  Foot Form - Detailed   Diabetic Foot Exam - detailed  Diabetic Foot exam was performed with the following findings:  Yes 01/19/2016  2:00 PM  Visual Foot Exam completed.:  Yes  Is there a history of foot ulcer?:  No  Can the patient see the bottom of their feet?:  Yes  Are the shoes appropriate in style and fit?:  Yes  Is there swelling or and abnormal foot shape?:  No  Are the toenails long?:  No  Are the toenails thick?:  No  Do you have pain in calf while walking?:  No  Is there a claw toe deformity?:  No  Is there elevated skin temparature?:  No  Is there limited skin dorsiflexion?:  No  Is there foot or ankle muscle weakness?:  No  Are the toenails ingrown?:  No  Normal Range of Motion:  Yes    Pulse Foot Exam completed.:  Yes  Right posterior Tibialias:  Present Left posterior Tibialias:  Present  Right Dorsalis Pedis:  Present Left Dorsalis Pedis:  Present  Sensory Foot Exam Completed.:  Yes  Swelling:  No  Semmes-Weinstein Monofilament Test  R Foot Test Control:  Neg L Foot Test Control:  Neg  R Site 1-Great Toe:  Neg L Site 1-Great Toe:  Neg  R Site 4:  Neg L Site 4:  Neg  R Site 5:  Neg L Site 5:  Neg          Assessment:     (1) Medicare Wellness, Subsequent (2) Annual Physical Examination (3) Hypertension (4) Hyperlipidemia (5) DM II     Plan:     (1) During the course of the visit the patient was educated and counseled about appropriate screening and preventive services including:    Pneumococcal vaccine   Influenza vaccine  Prostate cancer screening  Colorectal cancer screening  Nutrition counseling   (2) Depression screen negative. Health Maintenance reviewed -- Decluines flu shot. Pneumonia updated. Colonoscopy up-to-date. Preventive schedule discussed and handout given in AVS. Will obtain fasting labs today.  (3) Stable. Continue current medication regimen. Will check BMP today.  (4) Will check fasting lipid profile and lft today.  Continue current regimen. Increase aerobic exercise.  (5) Diabetic foot exam performed without abnormal findings. Will repeat labs today. Continue current regimen. Follow-up for diabetic eye exam as scheduled.   Patient Instructions (the written plan) was given to the patient.  Medicare Attestation I have personally reviewed: The patient's medical and social history Their use of alcohol, tobacco or illicit drugs Their current medications and supplements The patient's functional  ability including ADLs,fall risks, home safety risks, cognitive, and hearing and visual impairment Diet and physical activities Evidence for depression or mood disorders  The patient's weight, height, BMI, and visual acuity have been recorded in the chart.  I have made referrals, counseling, and provided education to the patient based on review of the above and I have provided the patient with a written personalized care plan for preventive services.     Raiford Noble Banks, [place]   01/24/2016

## 2016-01-21 ENCOUNTER — Telehealth: Payer: Self-pay | Admitting: Physician Assistant

## 2016-01-21 NOTE — Telephone Encounter (Signed)
Called the patient completed giving him his lab results from 01/19/2016  The patient did verbalize understanding/agreement of results.

## 2016-01-21 NOTE — Telephone Encounter (Signed)
-----   Message from Quintin Alto sent at 01/21/2016  9:08 AM EST ----- Regarding: Returned Call Contact: [phone] Patient was returning call but did not know who had called him. While on the phone our phone system shut down and hung up on him. Plse call him back

## 2016-01-21 NOTE — Telephone Encounter (Signed)
Patient was returning call to Cody's nurse and our phone system shut down, hanging up on him

## 2016-02-24 DIAGNOSIS — H26493 Other secondary cataract, bilateral: Secondary | ICD-10-CM | POA: Diagnosis not present

## 2016-02-24 DIAGNOSIS — D3132 Benign neoplasm of left choroid: Secondary | ICD-10-CM | POA: Diagnosis not present

## 2016-02-24 DIAGNOSIS — E119 Type 2 diabetes mellitus without complications: Secondary | ICD-10-CM | POA: Diagnosis not present

## 2016-02-24 DIAGNOSIS — H04123 Dry eye syndrome of bilateral lacrimal glands: Secondary | ICD-10-CM | POA: Diagnosis not present

## 2016-02-29 ENCOUNTER — Encounter: Payer: Self-pay | Admitting: Physician Assistant

## 2016-02-29 ENCOUNTER — Ambulatory Visit (HOSPITAL_BASED_OUTPATIENT_CLINIC_OR_DEPARTMENT_OTHER)
Admission: RE | Admit: 2016-02-29 | Discharge: 2016-02-29 | Disposition: A | Discharge status: Home / Self Care | Payer: Medicare HMO | Source: Ambulatory Visit | Attending: Physician Assistant | Admitting: Physician Assistant

## 2016-02-29 ENCOUNTER — Ambulatory Visit (INDEPENDENT_AMBULATORY_CARE_PROVIDER_SITE_OTHER): Payer: Medicare HMO | Admitting: Physician Assistant

## 2016-02-29 VITALS — BP 168/92 | HR 79 | Temp 98.0°F | Ht 73.0 in | Wt 223.4 lb

## 2016-02-29 DIAGNOSIS — M5136 Other intervertebral disc degeneration, lumbar region: Secondary | ICD-10-CM | POA: Insufficient documentation

## 2016-02-29 DIAGNOSIS — M545 Low back pain, unspecified: Secondary | ICD-10-CM

## 2016-02-29 MED ORDER — TRAMADOL HCL 50 MG PO TABS: 50.0000 mg | ORAL_TABLET | Freq: Three times a day (TID) | ORAL | Status: DC | PRN

## 2016-02-29 NOTE — Patient Instructions (Signed)
Please go downstairs for imaging. Take pain medication as directed for moderate to severe pain. Apply topical Aspercreme or Salon Pas to the area.  Avoid heavy lifting or overexertion.  We will likely set you up with PT for strengthening and stretches but will wait on x-ray results first.

## 2016-02-29 NOTE — Assessment & Plan Note (Signed)
Will obtain x-ray today to assess. Rx Tramadol. Supportive measures and OTC topical medications reviewed. Will set up PT once x-ray results has been reviewed.

## 2016-02-29 NOTE — Progress Notes (Signed)
Patient presents to clinic today c/o 2 months of right lowe back pain described as sharp and intermittent. Patient endorses pain sometimes radiates into hip. Denies radiation into leg. Denies numbness, weakness, saddle anesthesia or change to bowel/bladder habits.  Endorses pain occurs mainly with ROM.  Has been using an Counsellor which helps calm down pain.  At present, pain at 3/10.   Past Medical History  Diagnosis Date  . Allergic rhinitis   . Sinusitis   . Dyspnea   . Hypertension   . Hypercholesteremia   . Borderline diabetes mellitus   . Hiatal hernia   . GERD (gastroesophageal reflux disease)   . Peyronie's disease   . Lumbar back pain   . Chicken pox   . Diabetes mellitus without complication ([place])   . Melanoma ([place]) 2014    shoulder  . 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 History of stomach ulcers     1985    Current Outpatient Prescriptions on File Prior to Visit  Medication Sig Dispense Refill  . ACCU-CHEK AVIVA PLUS test strip USE AS DIRECTED 100 each 5  . ACCU-CHEK SOFTCLIX LANCETS lancets Use as instructed 100 each 5  . aspirin 81 MG tablet Take 81 mg by mouth daily.     . Blood Glucose Monitoring Suppl (ACCU-CHEK AVIVA PLUS) W/DEVICE KIT Test blood sugar once daily 1 kit 0  . lisinopril (PRINIVIL,ZESTRIL) 10 MG tablet Take 1 tablet (10 mg total) by mouth daily. . 90 tablet 1  . metFORMIN (GLUCOPHAGE-XR) 500 MG 24 hr tablet Take 1 tablet (500 mg total) by mouth daily with breakfast. 90 tablet 1  . omeprazole (PRILOSEC) 20 MG capsule Take 1 capsule (20 mg total) by mouth daily. 90 capsule 1  . simvastatin (ZOCOR) 40 MG tablet Take 1 tablet (40 mg total) by mouth at bedtime. 90 tablet 1   No current facility-administered medications on file prior to visit.    Allergies  Allergen Reactions  . Penicillins     REACTION: rash and itching    Family History  Problem Relation Age of Onset  . Diabetes Mother     Living  . Dementia Mother   .  Dementia Father     Deceased  . Diabetes Sister   . Healthy Son     x2  . Healthy Daughter     x1  . Colon cancer Neg Hx     Social History   Social History  . Marital Status: Married    Spouse Name: N/A  . Number of Children: 1  . 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 Smoking status: Never Smoker   . Smokeless tobacco: Never Used  . Alcohol Use: 0.6 oz/week    1 Glasses of wine per week  . Drug Use: No  . Sexual Activity: Not Asked   Other Topics Concern  . None   Social History Narrative   Review of Systems - See HPI.  All other ROS are negative.  BP 168/92 mmHg  Pulse 79  Temp(Src) 98 F (36.7 C) (Oral)  Ht '6\' 1"'  (1.854 m)  Wt 223 lb 6.4 oz (101.334 kg)  BMI 29.48 kg/m2  SpO2 94%  Physical Exam  Constitutional: He is oriented to person, place, and time and well-developed, well-nourished, and in no distress.  HENT:  Head: Normocephalic and atraumatic.  Cardiovascular: Normal rate, regular rhythm and  normal heart sounds.   Pulmonary/Chest: Effort normal.  Musculoskeletal:       Lumbar back: He exhibits tenderness. He exhibits normal range of motion, no bony tenderness and no spasm.       Back:  Pain with extension of lumbar spine noted.  Neurological: He is oriented to person, place, and time.  Skin: Skin is warm and dry. No rash noted.  Psychiatric: Affect normal.  Vitals reviewed.   Recent Results (from the past 2160 hour(s))  CBC     Status: None   Collection Time: 01/19/16  9:45 AM  Result Value Ref Range   WBC 7.4 4.0 - 10.5 K/uL   RBC 5.21 4.22 - 5.81 Mil/uL   Platelets 208.0 150.0 - 400.0 K/uL   Hemoglobin 16.6 13.0 - 17.0 g/dL   HCT 49.8 39.0 - 52.0 %   MCV 95.7 78.0 - 100.0 fl   MCHC 33.3 30.0 - 36.0 g/dL   RDW 13.4 11.5 - 15.5 %  Comp Met (CMET)     Status: Abnormal   Collection Time: 01/19/16  9:45 AM  Result Value Ref Range   Sodium 141 135 - 145 mEq/L    Potassium 4.1 3.5 - 5.1 mEq/L   Chloride 105 96 - 112 mEq/L   CO2 29 19 - 32 mEq/L   Glucose, Bld 127 (H) 70 - 99 mg/dL   BUN 13 6 - 23 mg/dL   Creatinine, Ser 1.12 0.40 - 1.50 mg/dL   Total Bilirubin 0.7 0.2 - 1.2 mg/dL   Alkaline Phosphatase 50 39 - 117 U/L   AST 27 0 - 37 U/L   ALT 30 0 - 53 U/L   Total Protein 6.8 6.0 - 8.3 g/dL   Albumin 4.4 3.5 - 5.2 g/dL   Calcium 9.4 8.4 - 10.5 mg/dL   GFR 67.79 >60.00 mL/min  TSH     Status: None   Collection Time: 01/19/16  9:45 AM  Result Value Ref Range   TSH 2.52 0.35 - 4.50 uIU/mL  Urinalysis, Routine w reflex microscopic     Status: Abnormal   Collection Time: 01/19/16  9:45 AM  Result Value Ref Range   Color, Urine YELLOW Yellow;Lt. Yellow   APPearance Cloudy (A) Clear    Comment: large amount of amorphous material present   Specific Gravity, Urine >=1.030 (A) 1.000 - 1.030   pH 5.5 5.0 - 8.0   Total Protein, Urine NEGATIVE Negative   Urine Glucose NEGATIVE Negative   Ketones, ur NEGATIVE Negative   Bilirubin Urine NEGATIVE Negative   Hgb urine dipstick NEGATIVE Negative   Urobilinogen, UA 0.2 0.0 - 1.0   Leukocytes, UA NEGATIVE Negative   Nitrite NEGATIVE Negative   WBC, UA 0-2/hpf 0-2/hpf   RBC / HPF none seen 0-2/hpf  Lipid Profile     Status: Abnormal   Collection Time: 01/19/16  9:45 AM  Result Value Ref Range   Cholesterol 138 0 - 200 mg/dL    Comment: ATP III Classification       Desirable:  < 200 mg/dL               Borderline High:  200 - 239 mg/dL          High:  > = 240 mg/dL   Triglycerides 176.0 (H) 0.0 - 149.0 mg/dL    Comment: Normal:  <150 mg/dLBorderline High:  150 - 199 mg/dL   HDL 42.70 >39.00 mg/dL   VLDL 35.2 0.0 - 40.0 mg/dL   LDL  Cholesterol 60 0 - 99 mg/dL   Total CHOL/HDL Ratio 3     Comment:                Men          Women1/2 Average Risk     3.4          3.3Average Risk          5.0          4.42X Average Risk          9.6          7.13X Average Risk          15.0          11.0                        NonHDL 95.21     Comment: NOTE:  Non-HDL goal should be 30 mg/dL higher than patient's LDL goal (i.e. LDL goal of < 70 mg/dL, would have non-HDL goal of < 100 mg/dL)  Hemoglobin A1c     Status: None   Collection Time: 01/19/16  9:45 AM  Result Value Ref Range   Hgb A1c MFr Bld 6.1 4.6 - 6.5 %    Comment: Glycemic Control Guidelines for People with Diabetes:Non Diabetic:  <6%Goal of Therapy: <7%Additional Action Suggested:  >8%   PSA, Medicare     Status: None   Collection Time: 01/19/16  9:45 AM  Result Value Ref Range   PSA 0.98 0.10 - 4.00 ng/ml    Assessment/Plan: Right-sided low back pain without sciatica Will obtain x-ray today to assess. Rx Tramadol. Supportive measures and OTC topical medications reviewed. Will set up PT once x-ray results has been reviewed.

## 2016-02-29 NOTE — Progress Notes (Signed)
Pre visit review using our clinic review tool, if applicable. No additional management support is needed unless otherwise documented below in the visit note. 

## 2016-03-01 ENCOUNTER — Telehealth: Payer: Self-pay | Admitting: *Deleted

## 2016-03-01 DIAGNOSIS — M1990 Unspecified osteoarthritis, unspecified site: Secondary | ICD-10-CM

## 2016-03-01 NOTE — Telephone Encounter (Signed)
Called and spoke with the pt and informed him of recent x-ray results and note.  Pt verbalized understanding and agreed to the referral to the Orthopedics.  Referral placed and sent.//AB/CMA

## 2016-03-01 NOTE — Telephone Encounter (Signed)
-----   Message from Brunetta Jeans, PA-C sent at 03/01/2016  8:22 AM EST ----- X-ray reveals significant arthritis. Would like to refer him to Orthopedics for further assessment and management.

## 2016-03-03 MED FILL — traMADol HCL 50 MG TABS: 50 | 10 days supply | Qty: 30 | Fill #0

## 2016-03-07 DIAGNOSIS — G8929 Other chronic pain: Secondary | ICD-10-CM | POA: Diagnosis not present

## 2016-03-07 DIAGNOSIS — M545 Low back pain: Secondary | ICD-10-CM | POA: Diagnosis not present

## 2016-03-07 DIAGNOSIS — M5136 Other intervertebral disc degeneration, lumbar region: Secondary | ICD-10-CM | POA: Diagnosis not present

## 2016-03-09 ENCOUNTER — Telehealth: Payer: Self-pay | Admitting: Physician Assistant

## 2016-03-09 NOTE — Telephone Encounter (Signed)
Please change referral to [facility] ortho per patient request, let me know if I need to write new referral

## 2016-03-09 NOTE — Telephone Encounter (Signed)
Caller name:Brassell,Jo Relation to [id number]  Call back number:[phone] / [phone]   Reason for call:  Patient was referred to Dr. Joaquim Lai and as per specialist "they don't do the type of therapy patient needs" patient was unsure of what therapy Dr. Joaquim Lai was referring too. Patient requesting referral to [facility] [address] [place], [place] [address] Phone: [phone]

## 2016-03-10 NOTE — Telephone Encounter (Signed)
Called and spoke with the pt and informed him of the note below.  Pt verbalized understanding and agreed.//AB/CMA

## 2016-03-10 NOTE — Telephone Encounter (Signed)
Referral faxed to [facility] appt

## 2016-03-15 DIAGNOSIS — M545 Low back pain: Secondary | ICD-10-CM | POA: Diagnosis not present

## 2016-03-15 DIAGNOSIS — G8929 Other chronic pain: Secondary | ICD-10-CM | POA: Diagnosis not present

## 2016-03-22 DIAGNOSIS — M5136 Other intervertebral disc degeneration, lumbar region: Secondary | ICD-10-CM | POA: Diagnosis not present

## 2016-03-22 DIAGNOSIS — G8929 Other chronic pain: Secondary | ICD-10-CM | POA: Diagnosis not present

## 2016-03-22 DIAGNOSIS — M545 Low back pain: Secondary | ICD-10-CM | POA: Diagnosis not present

## 2016-03-29 DIAGNOSIS — M5136 Other intervertebral disc degeneration, lumbar region: Secondary | ICD-10-CM | POA: Diagnosis not present

## 2016-04-05 DIAGNOSIS — G8929 Other chronic pain: Secondary | ICD-10-CM | POA: Diagnosis not present

## 2016-04-05 DIAGNOSIS — M545 Low back pain: Secondary | ICD-10-CM | POA: Diagnosis not present

## 2016-07-13 ENCOUNTER — Telehealth: Payer: Self-pay | Admitting: Physician Assistant

## 2016-07-13 NOTE — Telephone Encounter (Signed)
Pt due for 6 month follow-up appt w/ [facility]. Please schedule appt. Thank you.

## 2016-07-13 NOTE — Telephone Encounter (Signed)
°  Relationship to patient: Self  Can be reached: [phone]  Pharmacy:  [place], [place] [phone] (Phone) [phone] (Fax)       Reason for call: Request refill metFORMIN (GLUCOPHAGE-XR) 500 MG 24 hr tablet [id number]  And simvastatin (ZOCOR) 40 MG tablet [id number]  90 day supply.   Please send to mail order pharm today if possible

## 2016-07-14 NOTE — Telephone Encounter (Signed)
Attempted to contact patient to inform him of below. Patient's phone was not in service

## 2016-07-24 ENCOUNTER — Other Ambulatory Visit: Payer: Self-pay | Admitting: Physician Assistant

## 2016-07-24 ENCOUNTER — Telehealth: Payer: Self-pay | Admitting: Physician Assistant

## 2016-07-24 MED ORDER — SIMVASTATIN 40 MG PO TABS: 40.0000 mg | ORAL_TABLET | Freq: Every day | ORAL | 1 refills | Status: DC

## 2016-07-24 MED ORDER — OMEPRAZOLE 20 MG PO CPDR: 20.0000 mg | DELAYED_RELEASE_CAPSULE | Freq: Every day | ORAL | 1 refills | Status: DC

## 2016-07-24 NOTE — Telephone Encounter (Signed)
Relation to [id number] Call back number:[phone] Pharmacy: [facility], [place] [phone] (Phone) [phone] (Fax)     Reason for call:  Patient requesting a refill simvastatin (ZOCOR) 40 MG tablet and omeprazole (PRILOSEC) 20 MG capsule

## 2016-07-24 NOTE — Telephone Encounter (Signed)
Rx request to pharmacy/SLS  

## 2016-08-30 ENCOUNTER — Ambulatory Visit (INDEPENDENT_AMBULATORY_CARE_PROVIDER_SITE_OTHER): Payer: Medicare HMO | Admitting: Physician Assistant

## 2016-08-30 ENCOUNTER — Encounter: Payer: Self-pay | Admitting: Physician Assistant

## 2016-08-30 VITALS — BP 120/88 | HR 89 | Temp 98.1°F | Resp 16 | Ht 73.0 in | Wt 222.1 lb

## 2016-08-30 DIAGNOSIS — K219 Gastro-esophageal reflux disease without esophagitis: Secondary | ICD-10-CM | POA: Diagnosis not present

## 2016-08-30 DIAGNOSIS — M62838 Other muscle spasm: Secondary | ICD-10-CM

## 2016-08-30 DIAGNOSIS — M6248 Contracture of muscle, other site: Secondary | ICD-10-CM

## 2016-08-30 MED ORDER — RANITIDINE HCL 300 MG PO TABS: 300.0000 mg | ORAL_TABLET | Freq: Every day | ORAL | 0 refills | Status: DC

## 2016-08-30 NOTE — Patient Instructions (Signed)
Please continue the Prilosec as directed. Start a Ranitidine each evening (300 mg) over the next 4-5 days. Follow the diet below.  For neck tension -- consider getting a new pillow for a side-sleeper. Apply topical Icy Hot or Aspercreme as we discussed.  Let me know if symptoms are not resolving.

## 2016-08-30 NOTE — Progress Notes (Signed)
Patient presents to clinic today c/o 2 months of a sensation of a "lump in the throat" that has been present intermittently. Usually worse at night. Denies odynophagia or dysphagia. Patient with history of GERD, not currently on any medication. Denies heart burn or nausea. Patient also notes some stiffness in left posterior neck from time to time, usually worse on waking. Denies trauma or injury. Denies weakness of neck or pain with ROM.   Past Medical History:  Diagnosis Date  . Allergic rhinitis   . 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 Borderline diabetes mellitus   . Chicken pox   . Diabetes mellitus without complication ([place])   . Dyspnea   . GERD (gastroesophageal reflux disease)   . Hiatal hernia   . History of stomach ulcers    1985  . Hypercholesteremia   . Hypertension   . Lumbar back pain   . Melanoma ([place]) 2014   shoulder  . Peyronie's disease   . Sinusitis     Current Outpatient Prescriptions on File Prior to Visit  Medication Sig Dispense Refill  . ACCU-CHEK AVIVA PLUS test strip USE AS DIRECTED 100 each 5  . ACCU-CHEK SOFTCLIX LANCETS lancets Use as instructed 100 each 5  . aspirin 81 MG tablet Take 81 mg by mouth daily.     . Blood Glucose Monitoring Suppl (ACCU-CHEK AVIVA PLUS) W/DEVICE KIT Test blood sugar once daily 1 kit 0  . lisinopril (PRINIVIL,ZESTRIL) 10 MG tablet Take 1 tablet (10 mg total) by mouth daily. . 90 tablet 1  . metFORMIN (GLUCOPHAGE-XR) 500 MG 24 hr tablet TAKE 1 TABLET DAILY WITH   BREAKFAST 90 tablet 0  . omeprazole (PRILOSEC) 20 MG capsule Take 1 capsule (20 mg total) by mouth daily. 90 capsule 1  . simvastatin (ZOCOR) 40 MG tablet Take 1 tablet (40 mg total) by mouth at bedtime. 90 tablet 1  . traMADol (ULTRAM) 50 MG tablet Take 1 tablet (50 mg total) by mouth every 8 (eight) hours as needed. (Patient not taking: Reported on 08/30/2016) 30 tablet 0   No current facility-administered medications on file prior to visit.     Allergies    Allergen Reactions  . Penicillins     REACTION: rash and itching    Family History  Problem Relation Age of Onset  . Diabetes Mother     Living  . Dementia Mother   . Dementia Father     Deceased  . Diabetes Sister   . Healthy Son     x2  . Healthy Daughter     x1  . Colon cancer Neg Hx     Social History   Social History  . Marital status: Married    Spouse name: N/A  . Number of children: 1  . 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 Smoking status: Never Smoker  . Smokeless tobacco: Never Used  . Alcohol use 0.6 oz/week    1 Glasses of wine per week  . Drug use: No  . Sexual activity: Not Asked   Other Topics Concern  . None   Social History Narrative  . None   Review of Systems - See HPI.  All other ROS are negative.  BP 120/88 (BP Location: Left Arm, Patient Position: Sitting, Cuff Size: Large)   Pulse 89   Temp 98.1 F (36.7 C) (Oral)   Resp 16  Ht _0  (1.854 m)   Wt 222 lb 2 oz (100.8 kg)   SpO2 97%   BMI 29.31 kg/m   Physical Exam  Constitutional: He is oriented to person, place, and time and well-developed, well-nourished, and in no distress.  HENT:  Head: Normocephalic and atraumatic.  Eyes: Conjunctivae are normal.  Neck: Neck supple. No JVD present. Muscular tenderness present. No spinous process tenderness present. No neck rigidity. No erythema and normal range of motion present. No thyroid mass and no thyromegaly present.  Cardiovascular: Normal rate, regular rhythm, normal heart sounds and intact distal pulses.   Pulmonary/Chest: Effort normal and breath sounds normal. No respiratory distress. He has no wheezes. He has no rales. He exhibits no tenderness.  Lymphadenopathy:    He has no cervical adenopathy.  Neurological: He is alert and oriented to person, place, and time.  Vitals reviewed.  Assessment/Plan: 1. Gastroesophageal reflux disease without  esophagitis No palpable mass. Feel what patient is experiencing is a globus sensation. Continue PPI. Will add on ranitidine and probiotic. GERD diet discussed. If not improving in 4-5 days, will refer to ENT for further assessment.  2. Trapezius muscle spasm With palpable tension noted. ROM intact. Discussed change in pillow to help with support as he is a side sleeper and current pillow is > 76 years old. Discussed topical creams and heating pad to the area in 10-minute intervals 2-3 x day. FU if not improving.   Leeanne Rio, PA-C

## 2016-09-11 DIAGNOSIS — K219 Gastro-esophageal reflux disease without esophagitis: Secondary | ICD-10-CM | POA: Diagnosis not present

## 2016-09-11 DIAGNOSIS — I10 Essential (primary) hypertension: Secondary | ICD-10-CM | POA: Diagnosis not present

## 2016-09-11 DIAGNOSIS — Z0000 Encounter for general adult medical examination without abnormal findings: Secondary | ICD-10-CM | POA: Diagnosis not present

## 2016-09-11 DIAGNOSIS — E785 Hyperlipidemia, unspecified: Secondary | ICD-10-CM | POA: Diagnosis not present

## 2016-09-11 DIAGNOSIS — E119 Type 2 diabetes mellitus without complications: Secondary | ICD-10-CM | POA: Diagnosis not present

## 2016-09-15 ENCOUNTER — Ambulatory Visit (INDEPENDENT_AMBULATORY_CARE_PROVIDER_SITE_OTHER): Payer: Medicare HMO | Admitting: Physician Assistant

## 2016-09-15 ENCOUNTER — Encounter: Payer: Self-pay | Admitting: Physician Assistant

## 2016-09-15 VITALS — BP 110/76 | HR 88 | Temp 98.2°F | Resp 16 | Ht 73.0 in | Wt 220.1 lb

## 2016-09-15 DIAGNOSIS — I10 Essential (primary) hypertension: Secondary | ICD-10-CM

## 2016-09-15 DIAGNOSIS — E119 Type 2 diabetes mellitus without complications: Secondary | ICD-10-CM

## 2016-09-15 LAB — BASIC METABOLIC PANEL
BUN: 18 mg/dL (ref 6–23)
CALCIUM: 9.1 mg/dL (ref 8.4–10.5)
CO2: 29 meq/L (ref 19–32)
Chloride: 104 mEq/L (ref 96–112)
Creatinine, Ser: 0.99 mg/dL (ref 0.40–1.50)
GFR: 78.03 mL/min (ref >60.00)
GLUCOSE: 100 mg/dL — AB (ref 70–99)
Potassium: 3.8 mEq/L (ref 3.5–5.1)
Sodium: 140 mEq/L (ref 135–145)

## 2016-09-15 LAB — HEMOGLOBIN A1C: Hgb A1c MFr Bld: 6 % (ref 4.6–6.5)

## 2016-09-15 NOTE — Patient Instructions (Signed)
Please go to the lab for blood work. I will call you with your results.  Please get a new BP matching with the appropriate size BP cuff.  Check BP each morning and record. Call me next week with new readings. If still elevated, we will tweak medication regimen.  Try to increase exercise to goal of 150 minutes per week. Watch those sweets!

## 2016-09-15 NOTE — Progress Notes (Signed)
Patient presents to clinic today c/o elevated BP readings with home BP cuff ranging from 130/80 - 150/100. Patient denies chest pain, palpitations, lightheadedness, dizziness, vision changes or frequent headaches. Is currently on lisinopril 10 mg daily. Endorses taking medications as directed. Patient endorses home BP machine is several years old and wife notes that the cuff is small and meant for her arm. They do not have a larger cuff at home.  BP Readings from Last 3 Encounters:  09/15/16 110/76  08/30/16 120/88  02/29/16 (!) 168/92    Past Medical History:  Diagnosis Date  . Allergic rhinitis   . 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 Borderline diabetes mellitus   . Chicken pox   . Diabetes mellitus without complication ([place])   . Dyspnea   . GERD (gastroesophageal reflux disease)   . Hiatal hernia   . History of stomach ulcers    1985  . Hypercholesteremia   . Hypertension   . Lumbar back pain   . Melanoma ([place]) 2014   shoulder  . Peyronie's disease   . Sinusitis     Current Outpatient Prescriptions on File Prior to Visit  Medication Sig Dispense Refill  . ACCU-CHEK AVIVA PLUS test strip USE AS DIRECTED 100 each 5  . ACCU-CHEK SOFTCLIX LANCETS lancets Use as instructed 100 each 5  . aspirin 81 MG tablet Take 81 mg by mouth daily.     . Blood Glucose Monitoring Suppl (ACCU-CHEK AVIVA PLUS) W/DEVICE KIT Test blood sugar once daily 1 kit 0  . lisinopril (PRINIVIL,ZESTRIL) 10 MG tablet Take 1 tablet (10 mg total) by mouth daily. . 90 tablet 1  . metFORMIN (GLUCOPHAGE-XR) 500 MG 24 hr tablet TAKE 1 TABLET DAILY WITH   BREAKFAST 90 tablet 0  . omeprazole (PRILOSEC) 20 MG capsule Take 1 capsule (20 mg total) by mouth daily. 90 capsule 1  . simvastatin (ZOCOR) 40 MG tablet Take 1 tablet (40 mg total) by mouth at bedtime. 90 tablet 1  . traMADol (ULTRAM) 50 MG tablet Take 1 tablet (50 mg total) by mouth every 8 (eight) hours as needed. 30 tablet 0  . ranitidine (ZANTAC) 300  MG tablet Take 1 tablet (300 mg total) by mouth at bedtime. (Patient not taking: Reported on 09/15/2016) 30 tablet 0   No current facility-administered medications on file prior to visit.     Allergies  Allergen Reactions  . Penicillins     REACTION: rash and itching    Family History  Problem Relation Age of Onset  . Diabetes Mother     Living  . Dementia Mother   . Dementia Father     Deceased  . Diabetes Sister   . Healthy Son     x2  . Healthy Daughter     x1  . Colon cancer Neg Hx     Social History   Social History  . Marital status: Married    Spouse name: N/A  . Number of children: 1  . 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 Smoking status: Never Smoker  . Smokeless tobacco: Never Used  . Alcohol use 0.6 oz/week    1 Glasses of wine per week  . Drug use: No  . Sexual activity: Not Asked   Other Topics Concern  . None   Social History Narrative  . None   Review of Systems - See HPI.  All other ROS are negative.  BP 110/76 (BP Location: Right Arm, Patient Position: Sitting, Cuff Size: Large)   Pulse 88   Temp 98.2 F (36.8 C) (Oral)   Resp 16   Ht _0  (1.854 m)   Wt 220 lb 2 oz (99.8 kg)   SpO2 95%   BMI 29.04 kg/m   Physical Exam  Constitutional: He is oriented to person, place, and time and well-developed, well-nourished, and in no distress.  HENT:  Head: Normocephalic and atraumatic.  Eyes: Conjunctivae are normal.  Neck: Neck supple.  Cardiovascular: Normal rate, regular rhythm, normal heart sounds and intact distal pulses.   Pulmonary/Chest: Effort normal and breath sounds normal. No respiratory distress. He has no wheezes. He has no rales. He exhibits no tenderness.  Neurological: He is alert and oriented to person, place, and time.  Skin: Skin is warm and dry. No rash noted.  Psychiatric: Affect normal.  Vitals reviewed.  Assessment/Plan: Essential  hypertension Endorses elevated BP at home. BP normotensive here. Endorses compliance with medication. He has been instructed to get a new BP cuff at home that is the appropriate size. Check BP daily. Call me early next week with readings so we can alter regimen.    Leeanne Rio, PA-C

## 2016-09-15 NOTE — Assessment & Plan Note (Signed)
Endorses elevated BP at home. BP normotensive here. Endorses compliance with medication. He has been instructed to get a new BP cuff at home that is the appropriate size. Check BP daily. Call me early next week with readings so we can alter regimen.

## 2016-09-15 NOTE — Progress Notes (Signed)
Pre visit review using our clinic review tool, if applicable. No additional management support is needed unless otherwise documented below in the visit note/SLS  

## 2016-09-21 ENCOUNTER — Telehealth: Payer: Self-pay | Admitting: Physician Assistant

## 2016-09-21 NOTE — Telephone Encounter (Signed)
Caller name: Relationship to patient: Self Can be reached: [phone]  Pharmacy:  Reason for call: Patient request call back to give BP readings. Did not want to give them to anyone but the nurse.

## 2016-09-21 NOTE — Telephone Encounter (Signed)
Still elevated. Reviewed medication list -- his lisinopril was last prescribed at the end of January -- 6 month supply. If he is taking as directed daily, he should already be out of medication. Can we verify if he is taking as directed? If so, would increase to lisinopril 20 mg daily. If not, have him restart lisinopril 10 mg and follow-up with me in 3-4 weeks. Ok to send in quantity 30 with 1 refill of appropriate dose.

## 2016-09-21 NOTE — Telephone Encounter (Signed)
I called the patient and below are his BP numbers 09/17/16--BP 139/94, Heart rate 89 09/18/16--BP 150/100 heart rate 77 09/19/16--BP 149/96 heart rate 65                     PM on 09/19/16 was 142/100 09/20/16--BP 143/95  Heart rate 88 09/21/16--BP 150/98  Heart rate 64. Advise Please.

## 2016-09-22 MED ORDER — LISINOPRIL 20 MG PO TABS: 20.0000 mg | ORAL_TABLET | Freq: Every day | ORAL | 1 refills | Status: DC

## 2016-09-22 NOTE — Telephone Encounter (Signed)
Patient Unavailable; pt's spouse informed, understood & agreed; new Rx to pharmacy/SLS 09/29

## 2016-09-25 ENCOUNTER — Telehealth: Payer: Self-pay | Admitting: Physician Assistant

## 2016-09-25 NOTE — Telephone Encounter (Signed)
Pt is calling back in returning nurses call. He says that he would like to speak back directly with her before scheduling. He would like a call back today if possible.     CB: [phone]

## 2016-09-25 NOTE — Telephone Encounter (Signed)
Pt notified of recommendations and verbalized understanding. He has been following low salt diet and he did get new BP cuff as directed. He declined to schedule appt at time of call as he has several other appts this week. Stated he would call back to schedule follow-up. Instructed him to bring his new BP cuff with him to appt for comparison.

## 2016-09-25 NOTE — Telephone Encounter (Signed)
°  Relation to pt: self Call back number: [phone] / [phone]   Reason for call:  Patient would like to discuss BP concerns due medication change.

## 2016-09-25 NOTE — Telephone Encounter (Signed)
Appt scheduled 09/27/16 at 7:15am. Pt advised to take BP medication prior to appt and to bring home BP cuff to appt for appt and he agreed to instructions. No further concerns at time of call.

## 2016-09-25 NOTE — Telephone Encounter (Signed)
I would have him follow you instructions over the next 1-2 days regarding BP checking. He is not waiting long enough. Limit salt intake. Again making sure he has gotten a new cuff and is using that to check BP. Have him follow-up with me in office on Wednesday for reassessment.  The issue is that his BP is normal when he is here for visits. We are trying to figure out what the discrepancy is. I am afraid of overmedicating him if he is not checking BP correctly at home.

## 2016-09-25 NOTE — Telephone Encounter (Signed)
I would have him at least call me Wednesday with his readings. If still elevated I will be altering his medication regimen.

## 2016-09-25 NOTE — Telephone Encounter (Signed)
Called pt and left message to return call.

## 2016-09-25 NOTE — Telephone Encounter (Signed)
Pt is concerned that his BP is still running high since startin lisinopril 20 mg on Saturday. He is taking BP 30 min-1 hr after taking medication. Advised him for the future to wait about 2 hrs after taking meds to check BP to give medication time to have effect. BP readings are as follows. Please advise if any additional recommendations.   Saturday AM: 162/100, HR 81 Saturday PM: 161/98, HR 66 Sunday AM: 150/88, HR 64 Sunday PM: 145/101, HR 83 Monday AM: 153/104, HR 82

## 2016-09-27 ENCOUNTER — Ambulatory Visit (INDEPENDENT_AMBULATORY_CARE_PROVIDER_SITE_OTHER): Payer: Medicare HMO | Admitting: Physician Assistant

## 2016-09-27 ENCOUNTER — Encounter: Payer: Self-pay | Admitting: Physician Assistant

## 2016-09-27 VITALS — BP 140/96 | HR 88 | Temp 97.8°F | Resp 16 | Ht 73.0 in | Wt 220.2 lb

## 2016-09-27 DIAGNOSIS — I10 Essential (primary) hypertension: Secondary | ICD-10-CM

## 2016-09-27 LAB — TSH: TSH: 4.36 u[IU]/mL (ref 0.35–4.50)

## 2016-09-27 MED ORDER — HYDROCHLOROTHIAZIDE 12.5 MG PO TABS: 12.5000 mg | ORAL_TABLET | Freq: Every day | ORAL | 3 refills | Status: DC

## 2016-09-27 NOTE — Assessment & Plan Note (Signed)
BP consistently above goal. Is taking medication as directed without side effect. Is following DASH diet and working on exercise. EKG obtained with NSR. Asymptomatic. Will add on HCTZ 12.5 mg daily. Will check TSH today. FU scheduled.

## 2016-09-27 NOTE — Progress Notes (Signed)
Patient presents to clinic today for follow-up of hypertension. Patient is currently on lisinopril 20 mg daily. Endorses taking daily as directed without side effect. Endorses purchasing a new BP cuff at home and checking daily 1.5-2 hours after taking medication. This was done as previously there was a discrepancy between manual office BP and home BP with old cuff. Notes BP averaging 140-160/80-100.  Patient denies chest pain, palpitations, lightheadedness, dizziness, vision changes or frequent headaches. Has gone to the fire station twice to have them check BP manually. Noted BP consistently 140/90s at those checks.  BP Readings from Last 3 Encounters:  09/27/16 (!) 140/96  09/15/16 110/76  08/30/16 120/88    Past Medical History:  Diagnosis Date  . Allergic rhinitis   . 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 Borderline diabetes mellitus   . Chicken pox   . Diabetes mellitus without complication ([place])   . Dyspnea   . GERD (gastroesophageal reflux disease)   . Hiatal hernia   . History of stomach ulcers    1985  . Hypercholesteremia   . Hypertension   . Lumbar back pain   . Melanoma ([place]) 2014   shoulder  . Peyronie's disease   . Sinusitis     Current Outpatient Prescriptions on File Prior to Visit  Medication Sig Dispense Refill  . ACCU-CHEK AVIVA PLUS test strip USE AS DIRECTED 100 each 5  . ACCU-CHEK SOFTCLIX LANCETS lancets Use as instructed 100 each 5  . aspirin 81 MG tablet Take 81 mg by mouth daily.     . Blood Glucose Monitoring Suppl (ACCU-CHEK AVIVA PLUS) W/DEVICE KIT Test blood sugar once daily 1 kit 0  . lisinopril (PRINIVIL,ZESTRIL) 20 MG tablet Take 1 tablet (20 mg total) by mouth daily. 30 tablet 1  . metFORMIN (GLUCOPHAGE-XR) 500 MG 24 hr tablet TAKE 1 TABLET DAILY WITH   BREAKFAST 90 tablet 0  . omeprazole (PRILOSEC) 20 MG capsule Take 1 capsule (20 mg total) by mouth daily. 90 capsule 1  . simvastatin (ZOCOR) 40 MG tablet Take 1 tablet (40 mg total) by  mouth at bedtime. 90 tablet 1  . traMADol (ULTRAM) 50 MG tablet Take 1 tablet (50 mg total) by mouth every 8 (eight) hours as needed. 30 tablet 0   No current facility-administered medications on file prior to visit.     Allergies  Allergen Reactions  . Penicillins     REACTION: rash and itching    Family History  Problem Relation Age of Onset  . Diabetes Mother     Living  . Dementia Mother   . Dementia Father     Deceased  . Diabetes Sister   . Healthy Son     x2  . Healthy Daughter     x1  . Colon cancer Neg Hx     Social History   Social History  . Marital status: Married    Spouse name: N/A  . Number of children: 1  . 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 Smoking status: Never Smoker  . Smokeless tobacco: Never Used  . Alcohol use 0.6 oz/week    1 Glasses of wine per week  . Drug use: No  . Sexual activity: Not Asked   Other Topics Concern  . None   Social History Narrative  . None    Review of Systems - See HPI.  All other  ROS are negative.  BP (!) 140/96 (BP Location: Right Arm, Cuff Size: Large)   Pulse 88   Temp 97.8 F (36.6 C) (Oral)   Resp 16   Ht _0  (1.854 m)   Wt 220 lb 4 oz (99.9 kg)   SpO2 96%   BMI 29.06 kg/m   Physical Exam  Constitutional: He is oriented to person, place, and time and well-developed, well-nourished, and in no distress.  HENT:  Head: Normocephalic and atraumatic.  Eyes: Conjunctivae are normal.  Neck: Neck supple.  Cardiovascular: Normal rate, regular rhythm, normal heart sounds and intact distal pulses.   Pulmonary/Chest: Effort normal and breath sounds normal. No respiratory distress. He has no wheezes. He has no rales. He exhibits no tenderness.  Neurological: He is alert and oriented to person, place, and time.  Skin: Skin is warm and dry. No rash noted.  Psychiatric: Affect normal.  Vitals reviewed.   Recent Results (from the past  2160 hour(s))  Basic metabolic panel     Status: Abnormal   Collection Time: 09/15/16  1:57 PM  Result Value Ref Range   Sodium 140 135 - 145 mEq/L   Potassium 3.8 3.5 - 5.1 mEq/L   Chloride 104 96 - 112 mEq/L   CO2 29 19 - 32 mEq/L   Glucose, Bld 100 (H) 70 - 99 mg/dL   BUN 18 6 - 23 mg/dL   Creatinine, Ser 0.99 0.40 - 1.50 mg/dL   Calcium 9.1 8.4 - 10.5 mg/dL   GFR 78.03 >60.00 mL/min  Hemoglobin A1c     Status: None   Collection Time: 09/15/16  1:57 PM  Result Value Ref Range   Hgb A1c MFr Bld 6.0 4.6 - 6.5 %    Comment: Glycemic Control Guidelines for People with Diabetes:Non Diabetic:  <6%Goal of Therapy: <7%Additional Action Suggested:  >8%    Assessment/Plan: Essential hypertension BP consistently above goal. Is taking medication as directed without side effect. Is following DASH diet and working on exercise. EKG obtained with NSR. Asymptomatic. Will add on HCTZ 12.5 mg daily. Will check TSH today. FU scheduled.    Leeanne Rio, PA-C

## 2016-09-27 NOTE — Patient Instructions (Signed)
Please go to the lab for blood work. Keep up with the diet and exercise. Continue your lisinopril and start the HCTZ daily (this is a low dose). Follow-up in 3 weeks for reassessment.  Keep checking BP and call me with your results weekly.

## 2016-09-27 NOTE — Progress Notes (Signed)
Pre visit review using our clinic review tool, if applicable. No additional management support is needed unless otherwise documented below in the visit note/SLS  

## 2016-10-01 ENCOUNTER — Encounter: Payer: Self-pay | Admitting: Physician Assistant

## 2016-10-11 ENCOUNTER — Ambulatory Visit (INDEPENDENT_AMBULATORY_CARE_PROVIDER_SITE_OTHER): Payer: Medicare HMO | Admitting: Physician Assistant

## 2016-10-11 ENCOUNTER — Telehealth: Payer: Self-pay | Admitting: Physician Assistant

## 2016-10-11 ENCOUNTER — Encounter: Payer: Self-pay | Admitting: Physician Assistant

## 2016-10-11 VITALS — BP 118/72 | HR 85 | Temp 97.9°F | Resp 16 | Ht 73.0 in | Wt 220.4 lb

## 2016-10-11 DIAGNOSIS — R42 Dizziness and giddiness: Secondary | ICD-10-CM

## 2016-10-11 DIAGNOSIS — I10 Essential (primary) hypertension: Secondary | ICD-10-CM | POA: Diagnosis not present

## 2016-10-11 MED ORDER — DIAZEPAM 2 MG PO TABS: 1.0000 mg | ORAL_TABLET | Freq: Three times a day (TID) | ORAL | 0 refills | Status: DC | PRN

## 2016-10-11 MED ORDER — LISINOPRIL 20 MG PO TABS: 20.0000 mg | ORAL_TABLET | Freq: Every day | ORAL | 1 refills | Status: DC

## 2016-10-11 MED ORDER — OMEPRAZOLE 20 MG PO CPDR: 20.0000 mg | DELAYED_RELEASE_CAPSULE | Freq: Every day | ORAL | 1 refills | Status: DC

## 2016-10-11 MED ORDER — SIMVASTATIN 40 MG PO TABS: 40.0000 mg | ORAL_TABLET | Freq: Every day | ORAL | 1 refills | Status: DC

## 2016-10-11 MED ORDER — METFORMIN HCL ER 500 MG PO TB24: 500.0000 mg | ORAL_TABLET | Freq: Every day | ORAL | 1 refills | Status: DC

## 2016-10-11 MED ORDER — FLUTICASONE PROPIONATE 50 MCG/ACT NA SUSP: 2.0000 | Freq: Every day | NASAL | 6 refills | Status: DC

## 2016-10-11 MED ORDER — HYDROCHLOROTHIAZIDE 12.5 MG PO TABS
12.5000 mg | ORAL_TABLET | Freq: Every day | ORAL | 3 refills | Status: DC
Start: 2016-10-11 — End: 2017-01-19

## 2016-10-11 NOTE — Telephone Encounter (Signed)
Pt would like to transition to Dr. Nani Ravens due to current PCP leaving the office.    Is this switch okay?

## 2016-10-11 NOTE — Progress Notes (Signed)
Patient presents to clinic today c/o 4 days of vertigo/unsteadiness with position changes. Notes some ear pressure on the R side. Denies cough, congestion, fever, chills. Denies nausea or vomiting. Denies chest pain, palpitations, lightheadedness. Endorses doing home Epley maneuvers that worked previously for BPPV with improvement in symptoms.   Past Medical History:  Diagnosis Date  . Allergic rhinitis   . 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 Borderline diabetes mellitus   . Chicken pox   . Diabetes mellitus without complication ([place])   . Dyspnea   . GERD (gastroesophageal reflux disease)   . Hiatal hernia   . History of stomach ulcers    1985  . Hypercholesteremia   . Hypertension   . Lumbar back pain   . Melanoma ([place]) 2014   shoulder  . Peyronie's disease   . Sinusitis     Current Outpatient Prescriptions on File Prior to Visit  Medication Sig Dispense Refill  . ACCU-CHEK AVIVA PLUS test strip USE AS DIRECTED 100 each 5  . ACCU-CHEK SOFTCLIX LANCETS lancets Use as instructed 100 each 5  . aspirin 81 MG tablet Take 81 mg by mouth daily.     . Blood Glucose Monitoring Suppl (ACCU-CHEK AVIVA PLUS) W/DEVICE KIT Test blood sugar once daily 1 kit 0  . hydrochlorothiazide (HYDRODIURIL) 12.5 MG tablet Take 1 tablet (12.5 mg total) by mouth daily. 90 tablet 3  . lisinopril (PRINIVIL,ZESTRIL) 20 MG tablet Take 1 tablet (20 mg total) by mouth daily. 30 tablet 1  . metFORMIN (GLUCOPHAGE-XR) 500 MG 24 hr tablet TAKE 1 TABLET DAILY WITH   BREAKFAST 90 tablet 0  . omeprazole (PRILOSEC) 20 MG capsule Take 1 capsule (20 mg total) by mouth daily. 90 capsule 1  . simvastatin (ZOCOR) 40 MG tablet Take 1 tablet (40 mg total) by mouth at bedtime. 90 tablet 1  . traMADol (ULTRAM) 50 MG tablet Take 1 tablet (50 mg total) by mouth every 8 (eight) hours as needed. 30 tablet 0   No current facility-administered medications on file prior to visit.     Allergies  Allergen Reactions  .  Penicillins     REACTION: rash and itching    Family History  Problem Relation Age of Onset  . Diabetes Mother     Living  . Dementia Mother   . Dementia Father     Deceased  . Diabetes Sister   . Healthy Son     x2  . Healthy Daughter     x1  . Colon cancer Neg Hx     Social History   Social History  . Marital status: Married    Spouse name: N/A  . Number of children: 1  . 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 Smoking status: Never Smoker  . Smokeless tobacco: Never Used  . Alcohol use 0.6 oz/week    1 Glasses of wine per week  . Drug use: No  . Sexual activity: Not Asked   Other Topics Concern  . None   Social History Narrative  . None    Review of Systems - See HPI.  All other ROS are negative.  BP 118/72 (BP Location: Right Arm, Patient Position: Sitting, Cuff Size: Large)   Pulse 85   Temp 97.9 F (36.6 C) (Oral)   Resp 16   Ht '6\' 1"'  (1.854 m)   Wt 220 lb 6 oz (100  kg)   SpO2 95%   BMI 29.07 kg/m   Physical Exam  Constitutional: He is oriented to person, place, and time and well-developed, well-nourished, and in no distress.  HENT:  Head: Normocephalic and atraumatic.  Right Ear: Tympanic membrane is retracted. Tympanic membrane is not erythematous and not bulging. A middle ear effusion is present.  Left Ear: Tympanic membrane is retracted. Tympanic membrane is not erythematous and not bulging. A middle ear effusion is present.  Nose: Nose normal.  Mouth/Throat: Uvula is midline, oropharynx is clear and moist and mucous membranes are normal.  Eyes: Conjunctivae are normal.  Cardiovascular: Normal rate, regular rhythm, normal heart sounds and intact distal pulses.   Pulmonary/Chest: Effort normal and breath sounds normal. No respiratory distress. He has no wheezes. He has no rales. He exhibits no tenderness.  Neurological: He is alert and oriented to person, place, and time.    Skin: Skin is warm and dry. No rash noted.  Psychiatric: Affect normal.  Vitals reviewed.   Recent Results (from the past 2160 hour(s))  Basic metabolic panel     Status: Abnormal   Collection Time: 09/15/16  1:57 PM  Result Value Ref Range   Sodium 140 135 - 145 mEq/L   Potassium 3.8 3.5 - 5.1 mEq/L   Chloride 104 96 - 112 mEq/L   CO2 29 19 - 32 mEq/L   Glucose, Bld 100 (H) 70 - 99 mg/dL   BUN 18 6 - 23 mg/dL   Creatinine, Ser 0.99 0.40 - 1.50 mg/dL   Calcium 9.1 8.4 - 10.5 mg/dL   GFR 78.03 >60.00 mL/min  Hemoglobin A1c     Status: None   Collection Time: 09/15/16  1:57 PM  Result Value Ref Range   Hgb A1c MFr Bld 6.0 4.6 - 6.5 %    Comment: Glycemic Control Guidelines for People with Diabetes:Non Diabetic:  <6%Goal of Therapy: <7%Additional Action Suggested:  >8%   TSH     Status: None   Collection Time: 09/27/16  8:30 AM  Result Value Ref Range   TSH 4.36 0.35 - 4.50 uIU/mL    Assessment/Plan: 1. Essential hypertension Briefly reviewed with patient and wife at end of visit. Patient continues medications as directed. BP much improved. HCTZ refilled.  BP Readings from Last 3 Encounters:  10/11/16 118/72  09/27/16 (!) 140/96  09/15/16 110/76    - hydrochlorothiazide (HYDRODIURIL) 12.5 MG tablet; Take 1 tablet (12.5 mg total) by mouth daily.  Dispense: 90 tablet; Refill: 3  2. 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 Start Flonase. Continue Epley maneuvers at home to improve symptoms. Diazepam as directed. FU if not resolving.    Leeanne Rio, PA-C

## 2016-10-11 NOTE — Patient Instructions (Signed)
Please continue medications as directed.  Start the [facility] daily. Valium as directed for vertigo. Continue your Epley exercises.  Follow-up if symptoms are not resolving. Call me in 1-2 weeks with your BP readings.

## 2016-10-11 NOTE — Telephone Encounter (Signed)
OK with me. TY.

## 2016-10-11 NOTE — Telephone Encounter (Signed)
Ok with me 

## 2016-10-12 ENCOUNTER — Other Ambulatory Visit: Payer: Self-pay | Admitting: Physician Assistant

## 2016-10-12 MED ORDER — FLUTICASONE PROPIONATE 50 MCG/ACT NA SUSP: 2.0000 | Freq: Every day | NASAL | 6 refills | Status: DC

## 2016-10-12 NOTE — Telephone Encounter (Addendum)
Caller name:Chery,Jo Relation to [id number]  Call back number:[phone] Pharmacy: [facility] - [place], [place] - [address] AT [place] [phone] (Phone) [phone] (Fax)     Reason for call:  Spouse requesting fluticasone (FLONASE) 50 MCG/ACT nasal spray please send to retail and cancel mail order

## 2016-10-12 NOTE — Telephone Encounter (Signed)
Rx canceled w/ mail order and resent to local pharmacy.

## 2016-10-16 ENCOUNTER — Ambulatory Visit: Payer: Medicare HMO | Admitting: Physician Assistant

## 2016-10-17 ENCOUNTER — Telehealth: Payer: Self-pay | Admitting: Physician Assistant

## 2016-10-17 NOTE — Telephone Encounter (Signed)
I would continue over the next week until symptoms are mostly/completely resolved then as needed.

## 2016-10-17 NOTE — Telephone Encounter (Signed)
Patient has vertigo and has been using nasal spray for five days, he feels like the nasal spray is helping. However, he is wondering how long he needs to continue using the nasal spray?   Phone: [phone]

## 2016-10-18 NOTE — Telephone Encounter (Signed)
Patient informed, understood & agreed/SLS 10/25

## 2016-10-19 NOTE — Telephone Encounter (Signed)
Patient scheduled for 01/19/2017 at 9:30am with Dr. Nani Ravens.

## 2016-10-27 DIAGNOSIS — R42 Dizziness and giddiness: Secondary | ICD-10-CM | POA: Diagnosis not present

## 2016-10-30 DIAGNOSIS — R69 Illness, unspecified: Secondary | ICD-10-CM | POA: Diagnosis not present

## 2016-11-06 DIAGNOSIS — R42 Dizziness and giddiness: Secondary | ICD-10-CM | POA: Diagnosis not present

## 2016-11-10 DIAGNOSIS — I6782 Cerebral ischemia: Secondary | ICD-10-CM | POA: Diagnosis not present

## 2016-11-10 DIAGNOSIS — R42 Dizziness and giddiness: Secondary | ICD-10-CM | POA: Diagnosis not present

## 2016-11-15 DIAGNOSIS — Z08 Encounter for follow-up examination after completed treatment for malignant neoplasm: Secondary | ICD-10-CM | POA: Diagnosis not present

## 2016-11-15 DIAGNOSIS — L570 Actinic keratosis: Secondary | ICD-10-CM | POA: Diagnosis not present

## 2016-11-15 DIAGNOSIS — Z85820 Personal history of malignant melanoma of skin: Secondary | ICD-10-CM | POA: Diagnosis not present

## 2017-01-19 ENCOUNTER — Encounter: Payer: Self-pay | Admitting: Family Medicine

## 2017-01-19 ENCOUNTER — Encounter: Payer: Medicare HMO | Admitting: Physician Assistant

## 2017-01-19 ENCOUNTER — Ambulatory Visit (INDEPENDENT_AMBULATORY_CARE_PROVIDER_SITE_OTHER): Payer: Medicare HMO | Admitting: Family Medicine

## 2017-01-19 VITALS — BP 133/78 | HR 83 | Temp 98.2°F | Ht 73.0 in | Wt 219.8 lb

## 2017-01-19 DIAGNOSIS — E119 Type 2 diabetes mellitus without complications: Secondary | ICD-10-CM | POA: Diagnosis not present

## 2017-01-19 DIAGNOSIS — Z0000 Encounter for general adult medical examination without abnormal findings: Secondary | ICD-10-CM

## 2017-01-19 LAB — CBC WITH DIFFERENTIAL/PLATELET
BASOS ABS: 0 10*3/uL (ref 0.0–0.1)
BASOS PCT: 0.5 % (ref 0.0–3.0)
Eosinophils Absolute: 0.2 10*3/uL (ref 0.0–0.7)
Eosinophils Relative: 2.7 % (ref 0.0–5.0)
HCT: 47.8 % (ref 39.0–52.0)
Hemoglobin: 16.5 g/dL (ref 13.0–17.0)
LYMPHS ABS: 2.3 10*3/uL (ref 0.7–4.0)
Lymphocytes Relative: 30.2 % (ref 12.0–46.0)
MCHC: 34.5 g/dL (ref 30.0–36.0)
MCV: 94.2 fl (ref 78.0–100.0)
MONOS PCT: 9.8 % (ref 3.0–12.0)
Monocytes Absolute: 0.8 10*3/uL (ref 0.1–1.0)
NEUTROS ABS: 4.4 10*3/uL (ref 1.4–7.7)
Neutrophils Relative %: 56.8 % (ref 43.0–77.0)
PLATELETS: 195 10*3/uL (ref 150.0–400.0)
RBC: 5.08 Mil/uL (ref 4.22–5.81)
RDW: 13.7 % (ref 11.5–15.5)
WBC: 7.7 10*3/uL (ref 4.0–10.5)

## 2017-01-19 LAB — LIPID PANEL
CHOL/HDL RATIO: 3
Cholesterol: 134 mg/dL (ref 0–200)
HDL: 39.5 mg/dL (ref >39.00)
LDL Cholesterol: 72 mg/dL (ref 0–99)
NonHDL: 94.43
TRIGLYCERIDES: 114 mg/dL (ref 0.0–149.0)
VLDL: 22.8 mg/dL (ref 0.0–40.0)

## 2017-01-19 LAB — COMPREHENSIVE METABOLIC PANEL
ALT: 26 U/L (ref 0–53)
AST: 23 U/L (ref 0–37)
Albumin: 4.4 g/dL (ref 3.5–5.2)
Alkaline Phosphatase: 47 U/L (ref 39–117)
BILIRUBIN TOTAL: 0.6 mg/dL (ref 0.2–1.2)
BUN: 16 mg/dL (ref 6–23)
CO2: 30 meq/L (ref 19–32)
CREATININE: 1.17 mg/dL (ref 0.40–1.50)
Calcium: 9.5 mg/dL (ref 8.4–10.5)
Chloride: 102 mEq/L (ref 96–112)
GFR: 64.29 mL/min (ref >60.00)
Glucose, Bld: 144 mg/dL — ABNORMAL HIGH (ref 70–99)
Potassium: 3.5 mEq/L (ref 3.5–5.1)
SODIUM: 139 meq/L (ref 135–145)
Total Protein: 7.1 g/dL (ref 6.0–8.3)

## 2017-01-19 LAB — MICROALBUMIN / CREATININE URINE RATIO
Creatinine,U: 196.9 mg/dL
Microalb Creat Ratio: 0.4 mg/g (ref 0.0–30.0)
Microalb, Ur: <0.7 mg/dL (ref 0.0–1.9)

## 2017-01-19 LAB — HEMOGLOBIN A1C: Hgb A1c MFr Bld: 6.1 % (ref 4.6–6.5)

## 2017-01-19 MED ORDER — LISINOPRIL-HYDROCHLOROTHIAZIDE 20-12.5 MG PO TABS: 1.0000 | ORAL_TABLET | Freq: Every day | ORAL | 1 refills | Status: DC

## 2017-01-19 MED ORDER — OMEPRAZOLE 20 MG PO CPDR: 20.0000 mg | DELAYED_RELEASE_CAPSULE | Freq: Every day | ORAL | 1 refills | Status: DC

## 2017-01-19 MED ORDER — SIMVASTATIN 40 MG PO TABS: 40.0000 mg | ORAL_TABLET | Freq: Every day | ORAL | 1 refills | Status: DC

## 2017-01-19 MED ORDER — METFORMIN HCL ER 500 MG PO TB24: 500.0000 mg | ORAL_TABLET | Freq: Every day | ORAL | 1 refills | Status: DC

## 2017-01-19 NOTE — Patient Instructions (Addendum)
Ask your insurance company about coverage for the shingles vaccine (Shingrix).  Try to go to the gym with your wife. Consider yoga- Youtube has Acupuncturist.  Healthy Eating Plan Many factors influence your heart health, including eating and exercise habits. Heart (coronary) risk increases with abnormal blood fat (lipid) levels. Heart-healthy meal planning includes limiting unhealthy fats, increasing healthy fats, and making other small dietary changes. This includes maintaining a healthy body weight to help keep lipid levels within a normal range.  WHAT IS MY PLAN?  Your health care provider recommends that you:  Drink a glass of water before meals to help with satiety.  Eat slowly.  An alternative to the water is to add Metamucil. This will help with satiety as well. It does contain calories, unlike water.  WHAT TYPES OF FAT SHOULD I CHOOSE?  Choose healthy fats more often. Choose monounsaturated and polyunsaturated fats, such as olive oil and canola oil, flaxseeds, walnuts, almonds, and seeds.  Eat more omega-3 fats. Good choices include salmon, mackerel, sardines, tuna, flaxseed oil, and ground flaxseeds. Aim to eat fish at least two times each week.  Avoid foods with partially hydrogenated oils in them. These contain trans fats. Examples of foods that contain trans fats are stick margarine, some tub margarines, cookies, crackers, and other baked goods. If you are going to avoid a fat, this is the one to avoid!  WHAT GENERAL GUIDELINES DO I NEED TO FOLLOW?  Check food labels carefully to identify foods with trans fats. Avoid these types of options when possible.  Fill one half of your plate with vegetables and green salads. Eat 4-5 servings of vegetables per day. A serving of vegetables equals 1 cup of raw leafy vegetables,  cup of raw or cooked cut-up vegetables, or  cup of vegetable juice.  Fill one fourth of your plate with whole grains. Look for the word "whole" as the  first word in the ingredient list.  Fill one fourth of your plate with lean protein foods.  Eat 4-5 servings of fruit per day. A serving of fruit equals one medium whole fruit,  cup of dried fruit,  cup of fresh, frozen, or canned fruit. Try to avoid fruits in cups/syrups as the sugar content can be high.  Eat more foods that contain soluble fiber. Examples of foods that contain this type of fiber are apples, broccoli, carrots, beans, peas, and barley. Aim to get 20-30 g of fiber per day.  Eat more home-cooked food and less restaurant, buffet, and fast food.  Limit or avoid alcohol.  Limit foods that are high in starch and sugar.  Avoid fried foods when able.  Cook foods by using methods other than frying. Baking, boiling, grilling, and broiling are all great options. Other fat-reducing suggestions include: ? Removing the skin from poultry. ? Removing all visible fats from meats. ? Skimming the fat off of stews, soups, and gravies before serving them. ? Steaming vegetables in water or broth.  Lose weight if you are overweight. Losing just 5-10% of your initial body weight can help your overall health and prevent diseases such as diabetes and heart disease.  Increase your consumption of nuts, legumes, and seeds to 4-5 servings per week. One serving of dried beans or legumes equals  cup after being cooked, one serving of nuts equals 1 ounces, and one serving of seeds equals  ounce or 1 tablespoon.  WHAT ARE GOOD FOODS CAN I EAT? Grains Grainy breads (try to find bread that  is 3 g of fiber per slice or greater), oatmeal, light popcorn. Whole-grain cereals. Rice and pasta, including brown rice and those that are made with whole wheat. Edamame pasta is a great alternative to grain pasta. It has a higher protein content. Try to avoid significant consumption of white bread, sugary cereals, or pastries/baked goods.  Vegetables All vegetables. Cooked white potatoes do not count as  vegetables.  Fruits All fruits, but limit pineapple and bananas as these fruits have a higher sugar content.  Meats and Other Protein Sources Lean, well-trimmed beef, veal, pork, and lamb. Chicken and [place] without skin. All fish and shellfish. Wild duck, rabbit, pheasant, and venison. Egg whites or low-cholesterol egg substitutes. Dried beans, peas, lentils, and tofu.Seeds and most nuts.  Dairy Low-fat or nonfat cheeses, including ricotta, string, and mozzarella. Skim or 1% milk that is liquid, powdered, or evaporated. Buttermilk that is made with low-fat milk. Nonfat or low-fat yogurt. Soy/Almond milk are good alternatives if you cannot handle dairy.  Beverages Water is the best for you. Sports drinks with less sugar are more desirable unless you are a highly active athlete.  Sweets and Desserts Sherbets and fruit ices. Honey, jam, marmalade, jelly, and syrups. Dark chocolate.  Eat all sweets and desserts in moderation.  Fats and Oils Nonhydrogenated (trans-free) margarines. Vegetable oils, including soybean, sesame, sunflower, olive, peanut, safflower, corn, canola, and cottonseed. Salad dressings or mayonnaise that are made with a vegetable oil. Limit added fats and oils that you use for cooking, baking, salads, and as spreads.  Other Cocoa powder. Coffee and tea. Most condiments.  The items listed above may not be a complete list of recommended foods or beverages. Contact your dietitian for more options.

## 2017-01-19 NOTE — Progress Notes (Signed)
Pre visit review using our clinic review tool, if applicable. No additional management support is needed unless otherwise documented below in the visit note. 

## 2017-01-19 NOTE — Progress Notes (Signed)
Chief Complaint  Patient presents with  . Annual Exam    Well Male Howard Fisher is here for a complete physical.   His last physical was >1 year ago.  Current diet: in general, a "healthy" diet, though could be better.  Current exercise: None Weight trend: stable Does pt snore? No. Daytime fatigue? No. Seat belt? Yes.   Wife is named [name]  Past Medical History:  Diagnosis Date  . Allergic rhinitis   . 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 Borderline diabetes mellitus   . Chicken pox   . Diabetes mellitus without complication ([place])   . Dyspnea   . GERD (gastroesophageal reflux disease)   . Hiatal hernia   . History of stomach ulcers    1985  . Hypercholesteremia   . Hypertension   . Lumbar back pain   . Melanoma ([place]) 2014   shoulder  . Peyronie's disease   . Sinusitis     Past Surgical History:  Procedure Laterality Date  . basal cell removed from face  12/2012  . CATARACT EXTRACTION Bilateral 11/2009   Dr. Charise Killian  . CHOLECYSTECTOMY  02/1993   Dr. Harlow Asa  . right hernia repair  2002   Dr. Deon Pilling  . skin cancer removed from right shoulder  11/2013   melanoma   Medications  Current Outpatient Prescriptions on File Prior to Visit  Medication Sig Dispense Refill  . ACCU-CHEK AVIVA PLUS test strip USE AS DIRECTED 100 each 5  . ACCU-CHEK SOFTCLIX LANCETS lancets Use as instructed 100 each 5  . aspirin 81 MG tablet Take 81 mg by mouth daily.     . Blood Glucose Monitoring Suppl (ACCU-CHEK AVIVA PLUS) W/DEVICE KIT Test blood sugar once daily 1 kit 0  . diazepam (VALIUM) 2 MG tablet Take 0.5-1 tablets (1-2 mg total) by mouth every 8 (eight) hours as needed for anxiety (vertigo). 10 tablet 0  . hydrochlorothiazide (HYDRODIURIL) 12.5 MG tablet Take 1 tablet (12.5 mg total) by mouth daily. 90 tablet 3  . lisinopril (PRINIVIL,ZESTRIL) 20 MG tablet Take 1 tablet (20 mg total) by mouth daily. 90 tablet 1  . metFORMIN (GLUCOPHAGE-XR) 500 MG 24 hr tablet Take 1 tablet (500 mg  total) by mouth daily with breakfast. 90 tablet 1  . omeprazole (PRILOSEC) 20 MG capsule Take 1 capsule (20 mg total) by mouth daily. 90 capsule 1  . simvastatin (ZOCOR) 40 MG tablet Take 1 tablet (40 mg total) by mouth at bedtime. 90 tablet 1  . traMADol (ULTRAM) 50 MG tablet Take 1 tablet (50 mg total) by mouth every 8 (eight) hours as needed. 30 tablet 0  . fluticasone (FLONASE) 50 MCG/ACT nasal spray Place 2 sprays into both nostrils daily. (Patient not taking: Reported on 01/19/2017) 16 g 6   Allergies Allergies  Allergen Reactions  . Penicillins     REACTION: rash and itching   Family History Family History  Problem Relation Age of Onset  . Diabetes Mother     Living  . Dementia Mother   . Dementia Father     Deceased  . Diabetes Sister   . Healthy Son     x2  . Healthy Daughter     x1  . Colon cancer Neg Hx     Review of Systems: Constitutional:  no unexpected change in weight, no fevers or chills Eye:  no recent significant change in vision Ear/Nose/Mouth/Throat:  Ears:  no tinnitus or hearing loss Nose/Mouth/Throat:  no complaints  of nasal congestion or bleeding, no sore throat and oral sores Cardiovascular:  no chest pain, no palpitations Respiratory:  no cough and no shortness of breath Gastrointestinal:  no abdominal pain, no change in bowel habits, no nausea, vomiting, diarrhea, or constipation and no black or bloody stool GU:  Male: negative for dysuria, frequency, and incontinence and negative for prostate symptoms Musculoskeletal/Extremities:  no pain, redness, or swelling of the joints Integumentary (Skin/Breast):  no abnormal skin lesions reported Neurologic:  no headaches, no numbness, tingling Endocrine:  weight changes, masses in the neck, heat/cold intolerance, bowel or skin changes, or cardiovascular system symptoms Hematologic/Lymphatic:  no abnormal bleeding, no HIV risk factors, no night sweats, no swollen nodes, no weight loss  Exam BP 133/78 (BP  Location: Left Arm, Patient Position: Sitting, Cuff Size: Small)   Pulse 83   Temp 98.2 F (36.8 C) (Oral)   Ht _0  (1.854 m)   Wt 219 lb 12.8 oz (99.7 kg)   SpO2 96%   BMI 29.00 kg/m  General:  well developed, well nourished, in no apparent distress Skin:  no significant moles, warts, or growths Head:  no masses, lesions, or tenderness Eyes:  pupils equal and round, sclera anicteric without injection Ears:  canals without lesions, TMs shiny without retraction, no obvious effusion, no erythema Nose:  nares patent, septum midline, mucosa normal Throat/Pharynx:  lips and gingiva without lesion; tongue and uvula midline; non-inflamed pharynx; no exudates or postnasal drainage Neck: neck supple without adenopathy, thyromegaly, or masses Lungs:  clear to auscultation, breath sounds equal bilaterally, no respiratory distress Cardio:  regular rate and rhythm without murmurs, heart sounds without clicks or rubs Abdomen:  abdomen soft, nontender; bowel sounds normal; no masses or organomegaly Genital (male): circumcised penis, no lesions or discharge; testes present bilaterally without masses or tenderness Rectal: Deferred Musculoskeletal:  symmetrical muscle groups noted without atrophy or deformity Extremities:  no clubbing, cyanosis, or edema, no deformities, no skin discoloration Neuro:  gait normal; deep tendon reflexes normal and symmetric Psych: well oriented with normal range of affect and appropriate judgment/insight  Assessment and Plan  Well adult exam  Type 2 diabetes mellitus without complication, without long-term current use of insulin (HCC) - Plan: Comprehensive metabolic panel, Hemoglobin A1c, Microalbumin / creatinine urine ratio, Lipid panel, CBC w/Diff   Well 76 y.o. male. Counseled on diet and exercise. Discussed going to the gym with his wife as well as yoga. Immunizations, labs, and further orders as above. Follow up in 6 mo for DM visit pending the above  workup. The patient and his wife voiced understanding and agreement to the plan.  [place], DO 01/19/17 11:51 AM

## 2017-02-26 DIAGNOSIS — E119 Type 2 diabetes mellitus without complications: Secondary | ICD-10-CM | POA: Diagnosis not present

## 2017-02-26 DIAGNOSIS — H26491 Other secondary cataract, right eye: Secondary | ICD-10-CM | POA: Diagnosis not present

## 2017-02-26 DIAGNOSIS — D3132 Benign neoplasm of left choroid: Secondary | ICD-10-CM | POA: Diagnosis not present

## 2017-03-22 DIAGNOSIS — L821 Other seborrheic keratosis: Secondary | ICD-10-CM | POA: Diagnosis not present

## 2017-03-22 DIAGNOSIS — L570 Actinic keratosis: Secondary | ICD-10-CM | POA: Diagnosis not present

## 2017-04-04 DIAGNOSIS — H02413 Mechanical ptosis of bilateral eyelids: Secondary | ICD-10-CM | POA: Diagnosis not present

## 2017-05-14 DIAGNOSIS — H02411 Mechanical ptosis of right eyelid: Secondary | ICD-10-CM | POA: Diagnosis not present

## 2017-05-14 DIAGNOSIS — H02413 Mechanical ptosis of bilateral eyelids: Secondary | ICD-10-CM | POA: Diagnosis not present

## 2017-05-14 DIAGNOSIS — H02532 Eyelid retraction right lower eyelid: Secondary | ICD-10-CM | POA: Diagnosis not present

## 2017-05-14 DIAGNOSIS — H02132 Senile ectropion of right lower eyelid: Secondary | ICD-10-CM | POA: Diagnosis not present

## 2017-05-14 DIAGNOSIS — H02412 Mechanical ptosis of left eyelid: Secondary | ICD-10-CM | POA: Diagnosis not present

## 2017-05-14 LAB — HM DIABETES EYE EXAM

## 2017-06-04 ENCOUNTER — Encounter: Payer: Self-pay | Admitting: Family Medicine

## 2017-06-04 ENCOUNTER — Ambulatory Visit (INDEPENDENT_AMBULATORY_CARE_PROVIDER_SITE_OTHER): Payer: Medicare HMO | Admitting: Family Medicine

## 2017-06-04 ENCOUNTER — Telehealth: Payer: Self-pay | Admitting: Family Medicine

## 2017-06-04 VITALS — BP 130/62 | HR 95 | Temp 98.3°F | Ht 73.0 in | Wt 218.0 lb

## 2017-06-04 DIAGNOSIS — L02421 Furuncle of right axilla: Secondary | ICD-10-CM

## 2017-06-04 MED ORDER — DOXYCYCLINE HYCLATE 100 MG PO TABS
100.0000 mg | ORAL_TABLET | Freq: Two times a day (BID) | ORAL | 0 refills | Status: DC
Start: 2017-06-04 — End: 2017-08-08

## 2017-06-04 MED ORDER — DOXYCYCLINE HYCLATE 100 MG PO TABS: 100.0000 mg | ORAL_TABLET | Freq: Two times a day (BID) | ORAL | 0 refills | Status: DC

## 2017-06-04 NOTE — Addendum Note (Signed)
Addended by: Harl Bowie on: 06/04/2017 12:40 PM   Modules accepted: Orders

## 2017-06-04 NOTE — Telephone Encounter (Signed)
Caller name: Relationship to patient: Self Can be reached: Pharmacy:  [facility], [place] [phone] (Phone) [phone] (Fax)     Reason for call: Medication was sent to the wrong Pharmacy. Plse send to [facility]

## 2017-06-04 NOTE — Telephone Encounter (Signed)
Rx sent to [facility].//AB/CMA

## 2017-06-04 NOTE — Progress Notes (Signed)
Chief Complaint  Patient presents with  . Knots under (R) arrm pit    x 3 weeks-tender    Subjective: Patient is a 76 y.o. male here for knots under R armpit.  He noticed some 3 weeks ago. They're mildly painful and do not itch. He has not been using deodorant lately and notices no changes. No other new soaps, topicals, lotions, or detergents. He does not notice anything draining currently. There is some redness, however it is not spreading. He denies any fevers or sick contacts. This is not a recurrent issue. 2x1 and 1.5 x 0.8 cm  ROS: Skin: as noted in HPI  Family History  Problem Relation Age of Onset  . Diabetes Mother        Living  . Dementia Mother   . Dementia Father        Deceased  . Diabetes Sister   . Healthy Son        x2  . Healthy Daughter        x1  . Colon cancer Neg Hx    Past Medical History:  Diagnosis Date  . Allergic rhinitis   . 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 Borderline diabetes mellitus   . Chicken pox   . Diabetes mellitus without complication ([place])   . Dyspnea   . GERD (gastroesophageal reflux disease)   . Hiatal hernia   . History of stomach ulcers    1985  . Hypercholesteremia   . Hypertension   . Lumbar back pain   . Melanoma ([place]) 2014   shoulder  . Peyronie's disease   . Sinusitis    Allergies  Allergen Reactions  . Penicillins     REACTION: rash and itching    Current Outpatient Prescriptions:  .  ACCU-CHEK AVIVA PLUS test strip, USE AS DIRECTED, Disp: 100 each, Rfl: 5 .  ACCU-CHEK SOFTCLIX LANCETS lancets, Use as instructed, Disp: 100 each, Rfl: 5 .  aspirin 81 MG tablet, Take 81 mg by mouth daily. , Disp: , Rfl:  .  Blood Glucose Monitoring Suppl (ACCU-CHEK AVIVA PLUS) W/DEVICE KIT, Test blood sugar once daily, Disp: 1 kit, Rfl: 0 .  fluticasone (FLONASE) 50 MCG/ACT nasal spray, Place 2 sprays into both nostrils daily., Disp: 16 g, Rfl: 6 .  lisinopril-hydrochlorothiazide (PRINZIDE,ZESTORETIC) 20-12.5 MG tablet,  Take 1 tablet by mouth daily., Disp: 90 tablet, Rfl: 1 .  metFORMIN (GLUCOPHAGE-XR) 500 MG 24 hr tablet, Take 1 tablet (500 mg total) by mouth daily with breakfast., Disp: 90 tablet, Rfl: 1 .  omeprazole (PRILOSEC) 20 MG capsule, Take 1 capsule (20 mg total) by mouth daily., Disp: 90 capsule, Rfl: 1 .  simvastatin (ZOCOR) 40 MG tablet, Take 1 tablet (40 mg total) by mouth at bedtime., Disp: 90 tablet, Rfl: 1 .  traMADol (ULTRAM) 50 MG tablet, Take 1 tablet (50 mg total) by mouth every 8 (eight) hours as needed., Disp: 30 tablet, Rfl: 0 .  doxycycline (VIBRA-TABS) 100 MG tablet, Take 1 tablet (100 mg total) by mouth 2 (two) times daily., Disp: 20 tablet, Rfl: 0  Objective: BP 130/62 (BP Location: Left Arm, Patient Position: Sitting, Cuff Size: Normal)   Pulse 95   Temp 98.3 F (36.8 C) (Oral)   Ht '6\' 1"'  (1.854 m)   Wt 218 lb (98.9 kg)   SpO2 97%   BMI 28.76 kg/m  General: Awake, appears stated age Lungs: No accessory muscle use Skin: under R axillae, there are 2 lesions, the cephalad  lesion is 1 x 2 cm in diameter, circular and raised lesion that is mildly TTP, erythematous, no significant warmth, some purulent material can be expressed, no fluctuance; the other lesion is 1.5 x 0.8 cm in dimension, elliptically shaped, no drainage, indurated, no TTP, no warmth Psych: Age appropriate judgment and insight, normal affect and mood  Assessment and Plan: Furuncle of right axilla - Plan: doxycycline (VIBRA-TABS) 100 MG tablet  2 furuncles  Orders as above. F/u in 3 days, if no improvement, will open up. Cancel if doing better. The patient voiced understanding and agreement to the plan.  [place], DO 06/04/17  11:31 AM

## 2017-06-04 NOTE — Patient Instructions (Signed)
If you start having fevers, spreading redness, pain that is uncontrollable, or foul odors, let us know or seek care.  Keep area clean and dry.  Cancel appointment if you start to improve.

## 2017-06-07 ENCOUNTER — Ambulatory Visit: Payer: Medicare HMO | Admitting: Family Medicine

## 2017-06-18 ENCOUNTER — Telehealth: Payer: Self-pay | Admitting: Family Medicine

## 2017-06-18 MED ORDER — LISINOPRIL-HYDROCHLOROTHIAZIDE 20-12.5 MG PO TABS: 1.0000 | ORAL_TABLET | Freq: Every day | ORAL | 1 refills | Status: DC

## 2017-06-18 NOTE — Telephone Encounter (Signed)
Relation to HL:KTGY Call back [place]: [place], [place] [phone] ([phone] (Fax)    Reason for call:  Patient requesting a 90 day refill lisinopril-hydrochlorothiazide (PRINZIDE,ZESTORETIC) 20-12.5 MG tablet

## 2017-06-18 NOTE — Telephone Encounter (Signed)
Rx approved and sent to the pharmacy by e-script.//AB/CMA 

## 2017-07-19 ENCOUNTER — Ambulatory Visit: Payer: Medicare HMO | Admitting: Family Medicine

## 2017-07-25 DIAGNOSIS — E119 Type 2 diabetes mellitus without complications: Secondary | ICD-10-CM | POA: Diagnosis not present

## 2017-07-25 DIAGNOSIS — Z7982 Long term (current) use of aspirin: Secondary | ICD-10-CM | POA: Diagnosis not present

## 2017-07-25 DIAGNOSIS — I10 Essential (primary) hypertension: Secondary | ICD-10-CM | POA: Diagnosis not present

## 2017-07-25 DIAGNOSIS — E785 Hyperlipidemia, unspecified: Secondary | ICD-10-CM | POA: Diagnosis not present

## 2017-07-25 DIAGNOSIS — E669 Obesity, unspecified: Secondary | ICD-10-CM | POA: Diagnosis not present

## 2017-07-25 DIAGNOSIS — Z965 Presence of tooth-root and mandibular implants: Secondary | ICD-10-CM | POA: Diagnosis not present

## 2017-07-25 DIAGNOSIS — Z7984 Long term (current) use of oral hypoglycemic drugs: Secondary | ICD-10-CM | POA: Diagnosis not present

## 2017-07-25 DIAGNOSIS — K219 Gastro-esophageal reflux disease without esophagitis: Secondary | ICD-10-CM | POA: Diagnosis not present

## 2017-07-25 DIAGNOSIS — Z6829 Body mass index (BMI) 29.0-29.9, adult: Secondary | ICD-10-CM | POA: Diagnosis not present

## 2017-07-25 DIAGNOSIS — Z0000 Encounter for general adult medical examination without abnormal findings: Secondary | ICD-10-CM | POA: Diagnosis not present

## 2017-07-25 DIAGNOSIS — Z9842 Cataract extraction status, left eye: Secondary | ICD-10-CM | POA: Diagnosis not present

## 2017-08-08 ENCOUNTER — Encounter: Payer: Self-pay | Admitting: Family Medicine

## 2017-08-08 ENCOUNTER — Ambulatory Visit (INDEPENDENT_AMBULATORY_CARE_PROVIDER_SITE_OTHER): Payer: Medicare HMO | Admitting: Family Medicine

## 2017-08-08 ENCOUNTER — Other Ambulatory Visit: Payer: Self-pay | Admitting: Family Medicine

## 2017-08-08 VITALS — BP 128/82 | HR 82 | Temp 98.2°F | Ht 73.0 in | Wt 217.2 lb

## 2017-08-08 DIAGNOSIS — R259 Unspecified abnormal involuntary movements: Principal | ICD-10-CM

## 2017-08-08 DIAGNOSIS — R251 Tremor, unspecified: Secondary | ICD-10-CM | POA: Diagnosis not present

## 2017-08-08 DIAGNOSIS — G252 Other specified forms of tremor: Secondary | ICD-10-CM

## 2017-08-08 DIAGNOSIS — M62838 Other muscle spasm: Secondary | ICD-10-CM

## 2017-08-08 LAB — BASIC METABOLIC PANEL
BUN: 14 mg/dL (ref 6–23)
CO2: 31 mEq/L (ref 19–32)
Calcium: 9.3 mg/dL (ref 8.4–10.5)
Chloride: 101 mEq/L (ref 96–112)
Creatinine, Ser: 1.05 mg/dL (ref 0.40–1.50)
GFR: 72.73 mL/min (ref >60.00)
Glucose, Bld: 129 mg/dL — ABNORMAL HIGH (ref 70–99)
POTASSIUM: 3.8 meq/L (ref 3.5–5.1)
Sodium: 138 mEq/L (ref 135–145)

## 2017-08-08 LAB — MAGNESIUM: MAGNESIUM: 1.9 mg/dL (ref 1.5–2.5)

## 2017-08-08 NOTE — Progress Notes (Signed)
Chief Complaint  Patient presents with  . Shaking    Patient is C/O right arm shaking. Describes as intermittent but bothersome and worsens with rest.  Symptoms started 1 month ago and denies any pain or tingling.    Subjective: Patient is a 76 y.o. male here for R arm shaking.He is here with his wife.  Over the past month, the patient has been having right hand shaking. Is progressively getting worse. He takes place when he is at rest. When he moves around, he does not notice it. There is no injury or change in activity. No new medications. No other areas of his body are affected. He is not having any numbness, tingling, pain, weakness, or balance issues. Of note, he notices nothing in his forearm.   ROS: MSK: No wrist/hand pain Neuro: As noted in HPI  Family History  Problem Relation Age of Onset  . Diabetes Mother        Living  . Dementia Mother   . Dementia Father        Deceased  . Diabetes Sister   . Healthy Son        x2  . Healthy Daughter        x1  . Colon cancer Neg Hx    Past Medical History:  Diagnosis Date  . Allergic rhinitis   . 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 Borderline diabetes mellitus   . Chicken pox   . Diabetes mellitus without complication ([place])   . Dyspnea   . GERD (gastroesophageal reflux disease)   . Hiatal hernia   . History of stomach ulcers    1985  . Hypercholesteremia   . Hypertension   . Lumbar back pain   . Melanoma ([place]) 2014   shoulder  . Peyronie's disease   . Sinusitis    Allergies  Allergen Reactions  . Penicillins     REACTION: rash and itching    Current Outpatient Prescriptions:  .  ACCU-CHEK AVIVA PLUS test strip, USE AS DIRECTED, Disp: 100 each, Rfl: 5 .  ACCU-CHEK SOFTCLIX LANCETS lancets, Use as instructed, Disp: 100 each, Rfl: 5 .  aspirin 81 MG tablet, Take 81 mg by mouth daily. , Disp: , Rfl:  .  Blood Glucose Monitoring Suppl (ACCU-CHEK AVIVA PLUS) W/DEVICE KIT, Test blood sugar once daily, Disp: 1  kit, Rfl: 0 .  fluticasone (FLONASE) 50 MCG/ACT nasal spray, Place 2 sprays into both nostrils daily., Disp: 16 g, Rfl: 6 .  lisinopril-hydrochlorothiazide (PRINZIDE,ZESTORETIC) 20-12.5 MG tablet, Take 1 tablet by mouth daily., Disp: 90 tablet, Rfl: 1 .  metFORMIN (GLUCOPHAGE-XR) 500 MG 24 hr tablet, Take 1 tablet (500 mg total) by mouth daily with breakfast., Disp: 90 tablet, Rfl: 1 .  omeprazole (PRILOSEC) 20 MG capsule, Take 1 capsule (20 mg total) by mouth daily., Disp: 90 capsule, Rfl: 1 .  simvastatin (ZOCOR) 40 MG tablet, Take 1 tablet (40 mg total) by mouth at bedtime., Disp: 90 tablet, Rfl: 1  Objective: BP 128/82 (BP Location: Right Arm, Patient Position: Sitting, Cuff Size: Large)   Pulse 82   Temp 98.2 F (36.8 C) (Oral)   Ht '6\' 1"'  (1.854 m)   Wt 217 lb 3.2 oz (98.5 kg)   SpO2 94%   BMI 28.66 kg/m  General: Awake, appears stated age HEENT: MMM, EOMi Heart: RRR, no murmurs Lungs: CTAB, no rales, wheezes or rhonchi. No accessory muscle use Neuro: No cerebellar signs, biceps reflexes 2/4 bilaterally, sensation intact to  pinprick bilaterally over fingers, negative Spurling's, a fine resting tremor noted, some twitching of forearm musculature. MSK: 5/5 strength throughout UE's. Psych: Age appropriate judgment and insight, normal affect and mood  Assessment and Plan: Tremor  Muscle spasm - Plan: Basic metabolic panel, Magnesium  Orders as above. Will make sure it is not related to a spasm issue. Stay well hydrated. If labs are normal, will refer to Neuro for another opinion. The patient and his wife voiced understanding and agreement to the plan.  [place], DO 08/08/17  1:34 PM

## 2017-08-08 NOTE — Progress Notes (Signed)
Neuro referral placed. Pt and wife notified via [place].

## 2017-08-08 NOTE — Patient Instructions (Signed)
Give [place] 2-3 business days to get the results of your labs back.   I will plan on sending you to a neurologist if your labs are normal.   Let us know if anything changes or if you need anything.

## 2017-08-15 ENCOUNTER — Telehealth: Payer: Self-pay | Admitting: Family Medicine

## 2017-08-15 NOTE — Telephone Encounter (Signed)
Referral faxed to [facility], awaiting appt

## 2017-08-15 NOTE — Telephone Encounter (Signed)
Relation to XF:QHKU Call back number: [phone] Pharmacy:  Reason for call:  Patient states he would like to be referred to a neurologist in [facility], please advise

## 2017-08-16 NOTE — Telephone Encounter (Signed)
Was this meant for Howard Fisher and not [place]?

## 2017-08-17 NOTE — Telephone Encounter (Signed)
Referral message sent to [facility], they will contact pt directly to schedule appt

## 2017-08-17 NOTE — Telephone Encounter (Signed)
°  Relation to [id number] /  Call back number: [phone]   Reason for call:  Spouse states [facility] is not accepting new patients, requesting referral go back to neurologist in [place],please advise when placed

## 2017-09-20 ENCOUNTER — Ambulatory Visit: Payer: Self-pay | Admitting: Neurology

## 2017-10-09 ENCOUNTER — Ambulatory Visit (INDEPENDENT_AMBULATORY_CARE_PROVIDER_SITE_OTHER): Payer: Medicare HMO | Admitting: Neurology

## 2017-10-09 ENCOUNTER — Encounter: Payer: Self-pay | Admitting: Neurology

## 2017-10-09 VITALS — BP 120/72 | HR 72 | Ht 73.0 in | Wt 216.0 lb

## 2017-10-09 DIAGNOSIS — R251 Tremor, unspecified: Secondary | ICD-10-CM

## 2017-10-09 DIAGNOSIS — G20C Parkinsonism, unspecified: Secondary | ICD-10-CM | POA: Insufficient documentation

## 2017-10-09 DIAGNOSIS — G20 Parkinson's disease: Secondary | ICD-10-CM

## 2017-10-09 MED ORDER — RASAGILINE MESYLATE 1 MG PO TABS: 1.0000 mg | ORAL_TABLET | Freq: Every day | ORAL | 11 refills | Status: DC

## 2017-10-09 NOTE — Progress Notes (Signed)
PATIENT: Howard Fisher DOB: 1941-06-01  Chief Complaint  Patient presents with  . Tremors    He is here with his wife, [name].  Reports right arm and hand tremors for the last three months.  Howard Fisher PCP    Shelda Pal, DO     HISTORICAL  Howard Fisher is a 76 year old male, seen in refer by his primary care doctor Shelda Pal, for evaluation of tremor, involving his right arm and right hand, initial evaluation was October 09 2017.  I have reviewed and summarized referring note, he has history of hypertension, diabetes, hyperlipidemia,  Since May of 2018, he noticed gradual onset right hand tremor, most noticeable when he is resting his right arm, he denies significant weakness, no left hand involvement,  He had a history of right shoulder injury in 2016 after he fell and landed on his right shoulder.  He denies loss sense of smell, he denies REM sleep disorder, no dizziness, chronic constipation, no family history of Parkinson's disease, he denies gait abnormality.   MRI of the brain November 2017 at the [facility] showed atrophy and small vessel disease no acute abnormality.  Laboratory evaluation in August 2018, normal magnesium, CBC, lipid profile, A1c was 6.1, normal TSH,  REVIEW OF SYSTEMS: Full 14 system review of systems performed and notable only for easy bruising, constipation, tremor, restless leg  ALLERGIES: Allergies  Allergen Reactions  . Penicillins     REACTION: rash and itching    HOME MEDICATIONS: Current Outpatient Prescriptions  Medication Sig Dispense Refill  . ACCU-CHEK AVIVA PLUS test strip USE AS DIRECTED 100 each 5  . ACCU-CHEK SOFTCLIX LANCETS lancets Use as instructed 100 each 5  . aspirin 81 MG tablet Take 81 mg by mouth daily.     . Blood Glucose Monitoring Suppl (ACCU-CHEK AVIVA PLUS) W/DEVICE KIT Test blood sugar once daily 1 kit 0  . lisinopril-hydrochlorothiazide (PRINZIDE,ZESTORETIC) 20-12.5 MG tablet Take 1 tablet  by mouth daily. 90 tablet 1  . metFORMIN (GLUCOPHAGE-XR) 500 MG 24 hr tablet Take 1 tablet (500 mg total) by mouth daily with breakfast. 90 tablet 1  . omeprazole (PRILOSEC) 20 MG capsule Take 1 capsule (20 mg total) by mouth daily. 90 capsule 1  . simvastatin (ZOCOR) 40 MG tablet Take 1 tablet (40 mg total) by mouth at bedtime. 90 tablet 1   No current facility-administered medications for this visit.     PAST MEDICAL HISTORY: Past Medical History:  Diagnosis Date  . Allergic rhinitis   . 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 Borderline diabetes mellitus   . Chicken pox   . Diabetes mellitus without complication ([name])   . Dyspnea   . GERD (gastroesophageal reflux disease)   . Hiatal hernia   . History of stomach ulcers    1985  . Hypercholesteremia   . Hypertension   . Lumbar back pain   . Melanoma ([name]) 2014   shoulder  . Peyronie's disease   . Sinusitis   . Tremor     PAST SURGICAL HISTORY: Past Surgical History:  Procedure Laterality Date  . basal cell removed from face  12/2012  . CATARACT EXTRACTION Bilateral 11/2009   Dr. Charise Killian  . CHOLECYSTECTOMY  02/1993   Dr. Harlow Asa  . right hernia repair  2002   Dr. Deon Pilling  . skin cancer removed from right shoulder  11/2013   melanoma    FAMILY HISTORY: Family History  Problem Relation Age of  Onset  . Diabetes Mother   . Dementia Mother   . Dementia Father   . Diabetes Sister   . Healthy Son        x2  . Healthy Daughter        x1  . Colon cancer Neg Hx     SOCIAL HISTORY:  Social History   Social History  . Marital status: Married    Spouse name: N/A  . Number of children: 3  . Years of education: One year college   Occupational History  . retired Administrator    Social History Main Topics  . Smoking status: Never Smoker  . Smokeless tobacco: Never Used  . Alcohol use 0.6 oz/week    1 Glasses of wine per week     Comment: 3 drinks per week  . Drug use: No  . Sexual activity: Not on file    Other Topics Concern  . Not on file   Social History Narrative   Lives at home with his wife.   Right-handed.   2 cups coffee in the morning.     PHYSICAL EXAM   Vitals:   10/09/17 1318  BP: 120/72  Pulse: 72  Weight: 216 lb (98 kg)  Height: _0  (1.854 m)    Not recorded      Body mass index is 28.5 kg/m.  PHYSICAL EXAMNIATION:  Gen: NAD, conversant, well nourised, obese, well groomed                     Cardiovascular: Regular rate rhythm, no peripheral edema, warm, nontender. Eyes: Conjunctivae clear without exudates or hemorrhage Neck: Supple, no carotid bruits. Pulmonary: Clear to auscultation bilaterally   NEUROLOGICAL EXAM:  MENTAL STATUS: Speech:    Speech is normal; fluent and spontaneous with normal comprehension.  Cognition:     Orientation to time, place and person     Normal recent and remote memory     Normal Attention span and concentration     Normal Language, naming, repeating,spontaneous speech     Fund of knowledge   CRANIAL NERVES: CN II: Visual fields are full to confrontation. Fundoscopic exam is normal with sharp discs and no vascular changes. Pupils are round equal and briskly reactive to light. CN III, IV, VI: extraocular movement are normal. No ptosis. CN V: Facial sensation is intact to pinprick in all 3 divisions bilaterally. Corneal responses are intact.  CN VII: Face is symmetric with normal eye closure and smile. CN VIII: Hearing is normal to rubbing fingers CN IX, X: Palate elevates symmetrically. Phonation is normal. CN XI: Head turning and shoulder shrug are intact CN XII: Tongue is midline with normal movements and no atrophy.  MOTOR: Right hand resting tremor, no significant weakness, mild bradykinesia, rigidity with reinforcement maneuver, right worse than left.  REFLEXES: Reflexes are 2+ and symmetric at the biceps, triceps, knees, and ankles. Plantar responses are flexor.  SENSORY: Intact to light touch,  pinprick, positional sensation and vibratory sensation are intact in fingers and toes.  COORDINATION: Rapid alternating movements and fine finger movements are intact. There is no dysmetria on finger-to-nose and heel-knee-shin.    GAIT/STANCE: Posture is normal. Gait is steady with normal steps, base, he has decreased right arm swing, more noticeable right hand tremor Romberg is absent.   DIAGNOSTIC DATA (LABS, IMAGING, TESTING) - I reviewed patient records, labs, notes, testing and imaging myself where available.   ASSESSMENT AND PLAN  BLUE WINTHER is  a 76 y.o. male    Right hand resting tremor, mostly suggestive of idiopathic Parkinson's disease.  Mainly involving right arm  DATscan  Azilect 39m daily  YMarcial Pacas M.D. Ph.D.  [facility] [address] [place] [address]Ph: [phone]: [phone] CC: WShelda Pal DO

## 2017-10-11 ENCOUNTER — Telehealth: Payer: Self-pay | Admitting: Neurology

## 2017-10-11 NOTE — Telephone Encounter (Signed)
Patient ready to  be scheduled for DAT Scat called Insurance No approval needed reference # [id number].

## 2017-10-24 ENCOUNTER — Other Ambulatory Visit: Payer: Self-pay | Admitting: *Deleted

## 2017-10-24 ENCOUNTER — Telehealth: Payer: Self-pay | Admitting: Neurology

## 2017-10-24 MED ORDER — RASAGILINE MESYLATE 1 MG PO TABS: 1.0000 mg | ORAL_TABLET | Freq: Every day | ORAL | 11 refills | Status: DC

## 2017-10-24 MED ORDER — RASAGILINE MESYLATE 1 MG PO TABS: 1.0000 mg | ORAL_TABLET | Freq: Every day | ORAL | 3 refills | Status: DC

## 2017-10-24 NOTE — Telephone Encounter (Signed)
Pt wife calling re: rasagiline (AZILECT) 1 MG TABS tablet, they will be ordering this from [place] at a less expensive rate.  Pt wife is asking if the prescription can be written and available for them to pick up on tomorrow.  Please call

## 2017-10-24 NOTE — Telephone Encounter (Addendum)
Returned call to patient's wife on HIPAA - states they need a prescription for his rasaliline 1mg  tablets printed for 100 tablets.  He is able to purchase this amount in [place] for $85.00.  Dr. Krista Blue has approved it and the signed prescription has been placed up front for pick up.  They are aware and appreciative.

## 2017-10-24 NOTE — Addendum Note (Signed)
Addended by: Noberto Retort C on: 10/24/2017 03:35 PM   Modules accepted: Orders

## 2017-10-29 NOTE — Telephone Encounter (Signed)
Patient's wife called and relayed they are going to Hold Of on DAT - Scan until January. The cost will be much cheaper for them.  Dr. Krista Blue is this ok to wait. Please return call to Patient's wife Howard Fisher - [phone].

## 2017-10-29 NOTE — Telephone Encounter (Signed)
Per vo by Dr. Krista Blue, it is okay for him to wait until January.  Spoke to patient's wife on HIPAA and she is aware.

## 2017-11-21 ENCOUNTER — Telehealth: Payer: Self-pay | Admitting: Family Medicine

## 2017-11-21 MED ORDER — METFORMIN HCL ER 500 MG PO TB24: 500.0000 mg | ORAL_TABLET | Freq: Every day | ORAL | 1 refills | Status: DC

## 2017-11-21 NOTE — Telephone Encounter (Signed)
Copied from [place]. Topic: General - Other >> Nov 21, 2017 12:07 PM Oneta Rack wrote: Relation to pt: self Call back number: [phone]  Pharmacy: [place], [place] [phone] (Phone) [phone] (Fax)    Reason for call:  Patient checking on the status of metFORMIN (GLUCOPHAGE-XR) 500 MG 24 hr tablet refill, patient states he contacted the pharmacy, patient states he will schedule physical after 01/22/17 but would like PCP to refill him out until February. Patient did verbalize he will call back and schedule physical. Please advise

## 2017-11-21 NOTE — Telephone Encounter (Signed)
Metformin sent in/patient notified.

## 2017-11-29 ENCOUNTER — Other Ambulatory Visit (HOSPITAL_COMMUNITY): Payer: Medicare HMO

## 2017-11-29 ENCOUNTER — Encounter (HOSPITAL_COMMUNITY): Payer: Medicare HMO

## 2017-11-29 ENCOUNTER — Ambulatory Visit: Payer: Self-pay | Admitting: *Deleted

## 2017-11-29 NOTE — Telephone Encounter (Signed)
FYI

## 2017-11-29 NOTE — Telephone Encounter (Signed)
Pt was concerned that his blood sugars have been up to 180 when checked the last couple times. He wanted advise on home care to get or keep his blood sugars down.  Home care advice given to patient.  Reason for Disposition . Blood glucose 60-240 mg/dl (3.5 -13 mmol/l)  Answer Assessment - Initial Assessment Questions 1. BLOOD GLUCOSE: "What is your blood glucose level?"      189 2. ONSET: "When did you check the blood glucose?"     1300 3. USUAL RANGE: "What is your glucose level usually?" (e.g., usual fasting morning value, usual evening value)     100-115 and sometimes less than 100 4. KETONES: "Do you check for ketones (urine or blood test strips)?" If yes, ask: "What does the test show now?"      no 5. TYPE 1 or 2:  "Do you know what type of diabetes you have?"  (e.g., Type 1, Type 2, Gestational; doesn't know)      Pre diabetic 6. INSULIN: "Do you take insulin?" If yes, ask: "Have you missed any shots recently?"     no 7. DIABETES PILLS: "Do you take any pills for your diabetes?" If yes, ask: "Have you missed taking any pills recently?"     Yes, metformin. Took it this morning 8. OTHER SYMPTOMS: "Do you have any symptoms?" (e.g., fever, frequent urination, difficulty breathing, dizziness, weakness, vomiting)     no 9. PREGNANCY: "Is there any chance you are pregnant?" "When was your last menstrual period?"     n/a  Protocols used: DIABETES - HIGH BLOOD SUGAR-A-AH

## 2017-12-11 ENCOUNTER — Telehealth: Payer: Self-pay | Admitting: Neurology

## 2017-12-11 NOTE — Telephone Encounter (Signed)
[facility] and left message for April Pait [phone]) to return my call.  Patient's wife has questions about instructions prior to DatScan. She is aware that I am reaching out for answers and will call her back.

## 2017-12-11 NOTE — Telephone Encounter (Signed)
Pt wife(on DPR) has called to inform that pt has been scheduled for scan @ [facility] for 01-03, wife is asking for a call to discuss pt's medications because of being told that some could affect the scan.  Pt wife has other questions concerning the scan as well. Please call

## 2017-12-12 NOTE — Telephone Encounter (Signed)
Spoke to April - she is going to call patient's wife and review his pre-procedure instructions with them again.

## 2017-12-27 ENCOUNTER — Encounter (HOSPITAL_COMMUNITY): Payer: Medicare HMO

## 2017-12-27 ENCOUNTER — Encounter (HOSPITAL_COMMUNITY)
Admission: RE | Admit: 2017-12-27 | Discharge: 2017-12-27 | Disposition: A | Discharge status: Home / Self Care | Payer: Medicare HMO | Source: Ambulatory Visit | Attending: Neurology | Admitting: Neurology

## 2017-12-27 DIAGNOSIS — G20 Parkinson's disease: Secondary | ICD-10-CM | POA: Insufficient documentation

## 2017-12-27 DIAGNOSIS — R251 Tremor, unspecified: Secondary | ICD-10-CM | POA: Diagnosis not present

## 2017-12-27 MED ORDER — IODINE STRONG (LUGOLS) 5 % PO SOLN
0.8000 mL | Freq: Once | ORAL | Status: AC
  Administered 2017-12-27: 0.8 mL via ORAL
  Filled 2017-12-27: qty 0.8

## 2017-12-27 MED ORDER — IOFLUPANE I 123 185 MBQ/2.5ML IV SOLN
4.8000 | Freq: Once | INTRAVENOUS | Status: AC
  Administered 2017-12-27: 4.8 via INTRAVENOUS

## 2017-12-27 MED ORDER — IODINE STRONG (LUGOLS) 5 % PO SOLN
0.0800 mL | Freq: Once | ORAL | Status: DC
  Filled 2017-12-27: qty 0.08

## 2017-12-31 ENCOUNTER — Telehealth: Payer: Self-pay | Admitting: Neurology

## 2017-12-31 NOTE — Telephone Encounter (Signed)
Please call patient, that [place] showed decreased radiotracer activity within bilateral basal ganglion, more on the left side, which consistent with Parkinson syndrome pattern  Decreased radiotracer activity within the bilateral striata with greater deficit on the LEFT. Findings consistent with a Parkinson's syndrome pattern .

## 2017-12-31 NOTE — Telephone Encounter (Signed)
Pt called request results, he can be reached at [phone]

## 2017-12-31 NOTE — Telephone Encounter (Signed)
He is aware of results and will keep his pending appt on 01/15/18 to further discuss.

## 2018-01-15 ENCOUNTER — Ambulatory Visit: Payer: Medicare HMO | Admitting: Neurology

## 2018-01-15 ENCOUNTER — Encounter: Payer: Self-pay | Admitting: Neurology

## 2018-01-15 DIAGNOSIS — G20 Parkinson's disease: Secondary | ICD-10-CM

## 2018-01-15 DIAGNOSIS — G20A1 Parkinson's disease without dyskinesia, without mention of fluctuations: Secondary | ICD-10-CM | POA: Insufficient documentation

## 2018-01-15 MED ORDER — CARBIDOPA-LEVODOPA 25-100 MG PO TABS
1.0000 | ORAL_TABLET | Freq: Three times a day (TID) | ORAL | 11 refills | Status: DC
Start: 2018-01-15 — End: 2018-07-15

## 2018-01-15 MED ORDER — CARBIDOPA-LEVODOPA 25-100 MG PO TABS
1.0000 | ORAL_TABLET | Freq: Three times a day (TID) | ORAL | 11 refills | Status: DC
Start: 2018-01-15 — End: 2018-01-15

## 2018-01-15 NOTE — Progress Notes (Signed)
PATIENT: Howard Fisher DOB: September 22, 1941  Chief Complaint  Patient presents with  . Tremors    He is here with his wife, [name], to discuss the results of his DatScan.  His hand tremors are worse. He has not noticed any changes with Azilect.      HISTORICAL  Howard Fisher is a 77 year old male, seen in refer by his primary care doctor Shelda Pal, for evaluation of tremor, involving his right arm and right hand, initial evaluation was October 09 2017.  I have reviewed and summarized referring note, he has history of hypertension, diabetes, hyperlipidemia,  Since May of 2018, he noticed gradual onset right hand tremor, most noticeable when he is resting his right arm, he denies significant weakness, no left hand involvement,  He had a history of right shoulder injury in 2016 after he fell and landed on his right shoulder.  He denies loss sense of smell, he denies REM sleep disorder, no dizziness, chronic constipation, no family history of Parkinson's disease, he denies gait abnormality.   MRI of the brain November 2017 at the [facility] showed atrophy and small vessel disease no acute abnormality.  Laboratory evaluation in August 2018, normal magnesium, CBC, lipid profile, A1c was 6.1, normal TSH,  Update January 15, 2018. 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   He started  to take Azilect 1 mg daily since October, getting prescription from [place], he denies significant sore, no significant improvement  REVIEW OF SYSTEMS: Full 14 system review of systems performed and notable only for easy bruising, constipation, tremor, restless leg  ALLERGIES: Allergies  Allergen Reactions  . Penicillins     REACTION: rash and itching    HOME MEDICATIONS: Current Outpatient Medications  Medication Sig Dispense Refill  .  ACCU-CHEK AVIVA PLUS test strip USE AS DIRECTED 100 each 5  . ACCU-CHEK SOFTCLIX LANCETS lancets Use as instructed 100 each 5  . aspirin 81 MG tablet Take 81 mg by mouth daily.     . Blood Glucose Monitoring Suppl (ACCU-CHEK AVIVA PLUS) W/DEVICE KIT Test blood sugar once daily 1 kit 0  . lisinopril-hydrochlorothiazide (PRINZIDE,ZESTORETIC) 20-12.5 MG tablet Take 1 tablet by mouth daily. 90 tablet 1  . metFORMIN (GLUCOPHAGE-XR) 500 MG 24 hr tablet Take 1 tablet (500 mg total) by mouth daily with breakfast. 90 tablet 1  . omeprazole (PRILOSEC) 20 MG capsule Take 1 capsule (20 mg total) by mouth daily. 90 capsule 1  . rasagiline (AZILECT) 1 MG TABS tablet Take 1 tablet (1 mg total) by mouth daily. 100 tablet 3  . simvastatin (ZOCOR) 40 MG tablet Take 1 tablet (40 mg total) by mouth at bedtime. 90 tablet 1   No current facility-administered medications for this visit.     PAST MEDICAL HISTORY: Past Medical History:  Diagnosis Date  . Allergic rhinitis   . 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 Borderline diabetes mellitus   . Chicken pox   . Diabetes mellitus without complication ([place])   . Dyspnea   . GERD (gastroesophageal reflux disease)   . Hiatal hernia   . History of stomach ulcers    1985  . Hypercholesteremia   . Hypertension   . Lumbar back pain   . Melanoma ([place]) 2014   shoulder  . Peyronie's disease   . Sinusitis   . Tremor  PAST SURGICAL HISTORY: Past Surgical History:  Procedure Laterality Date  . basal cell removed from face  12/2012  . CATARACT EXTRACTION Bilateral 11/2009   Dr. Charise Killian  . CHOLECYSTECTOMY  02/1993   Dr. Harlow Asa  . right hernia repair  2002   Dr. Deon Pilling  . skin cancer removed from right shoulder  11/2013   melanoma    FAMILY HISTORY: Family History  Problem Relation Age of Onset  . Diabetes Mother   . Dementia Mother   . Dementia Father   . Diabetes Sister   . Healthy Son        x2  . Healthy Daughter        x1  . Colon cancer Neg  Hx     SOCIAL HISTORY:  Social History   Socioeconomic History  . Marital status: Married    Spouse name: Not on file  . Number of children: 3  . Years of education: One year college  . Highest education level: Not on file  Social Needs  . Financial resource strain: Not on file  . Food insecurity - worry: Not on file  . Food insecurity - inability: Not on file  . Transportation needs - medical: Not on file  . Transportation needs - non-medical: Not on file  Occupational History  . Occupation: retired Nurse, mental health  . Smoking status: Never Smoker  . Smokeless tobacco: Never Used  Substance and Sexual Activity  . Alcohol use: Yes    Alcohol/week: 0.6 oz    Types: 1 Glasses of wine per week    Comment: 3 drinks per week  . Drug use: No  . Sexual activity: Not on file  Other Topics Concern  . Not on file  Social History Narrative   Lives at home with his wife.   Right-handed.   2 cups coffee in the morning.     PHYSICAL EXAM   Vitals:   01/15/18 1058  BP: 132/70  Pulse: 84  Weight: 215 lb 8 oz (97.8 kg)  Height: '6\' 1"'  (1.854 m)    Not recorded      Body mass index is 28.43 kg/m.  PHYSICAL EXAMNIATION:  Gen: NAD, conversant, well nourised, obese, well groomed                     Cardiovascular: Regular rate rhythm, no peripheral edema, warm, nontender. Eyes: Conjunctivae clear without exudates or hemorrhage Neck: Supple, no carotid bruits. Pulmonary: Clear to auscultation bilaterally   NEUROLOGICAL EXAM:  MENTAL STATUS: Speech:    Speech is normal; fluent and spontaneous with normal comprehension.  Cognition:     Orientation to time, place and person     Normal recent and remote memory     Normal Attention span and concentration     Normal Language, naming, repeating,spontaneous speech     Fund of knowledge   CRANIAL NERVES: CN II: Visual fields are full to confrontation. Fundoscopic exam is normal with sharp discs and no vascular  changes. Pupils are round equal and briskly reactive to light. CN III, IV, VI: extraocular movement are normal. No ptosis. CN V: Facial sensation is intact to pinprick in all 3 divisions bilaterally. Corneal responses are intact.  CN VII: Face is symmetric with normal eye closure and smile. CN VIII: Hearing is normal to rubbing fingers CN IX, X: Palate elevates symmetrically. Phonation is normal. CN XI: Head turning and shoulder shrug are intact CN XII: Tongue is midline with  normal movements and no atrophy.  MOTOR: Right hand resting tremor, no significant weakness, mild bradykinesia, rigidity with reinforcement maneuver, right worse than left.  REFLEXES: Reflexes are 2+ and symmetric at the biceps, triceps, knees, and ankles. Plantar responses are flexor.  SENSORY: Intact to light touch, pinprick, positional sensation and vibratory sensation are intact in fingers and toes.  COORDINATION: Rapid alternating movements and fine finger movements are intact. There is no dysmetria on finger-to-nose and heel-knee-shin.    GAIT/STANCE: Posture is normal. Gait is steady with normal steps, base, he has decreased right arm swing, more noticeable right hand tremor Romberg is absent.   DIAGNOSTIC DATA (LABS, IMAGING, TESTING) - I reviewed patient records, labs, notes, testing and imaging myself where available.   ASSESSMENT AND PLAN  MARQUON ALCALA is a 77 y.o. male    Idiopathic Parkinson's disease.     DATscan showed dominant deficiency of bilateral basal ganglion, worsening on the left side  Continue Azilect 18m daily  Add on Sinemet 25/100 mg 3 times a day  Also encouraged him moderate exercise  YMarcial Pacas M.D. Ph.D.  [facility] [address] [place] [id number]: [phone]: [phone] CC: WShelda Pal D

## 2018-01-24 ENCOUNTER — Telehealth: Payer: Self-pay | Admitting: Family Medicine

## 2018-01-24 NOTE — Telephone Encounter (Signed)
Patient called in wanting to know if he can take claritin with his medications for parkinson's, he said he's taken claritin in the past, I advised taking claritin 1 tab daily is fine and if he has any adverse symptoms to stop taking and call the office, he verbalized understanding.

## 2018-02-07 ENCOUNTER — Encounter: Payer: Self-pay | Admitting: Family Medicine

## 2018-02-07 ENCOUNTER — Ambulatory Visit (INDEPENDENT_AMBULATORY_CARE_PROVIDER_SITE_OTHER): Payer: Medicare HMO | Admitting: Family Medicine

## 2018-02-07 VITALS — BP 107/62 | HR 84 | Temp 98.0°F | Ht 73.0 in | Wt 212.4 lb

## 2018-02-07 DIAGNOSIS — Z0000 Encounter for general adult medical examination without abnormal findings: Secondary | ICD-10-CM | POA: Insufficient documentation

## 2018-02-07 DIAGNOSIS — G20 Parkinson's disease: Secondary | ICD-10-CM

## 2018-02-07 DIAGNOSIS — E119 Type 2 diabetes mellitus without complications: Secondary | ICD-10-CM | POA: Diagnosis not present

## 2018-02-07 DIAGNOSIS — Z2821 Immunization not carried out because of patient refusal: Secondary | ICD-10-CM

## 2018-02-07 LAB — CBC
HEMATOCRIT: 45.4 % (ref 39.0–52.0)
Hemoglobin: 15.9 g/dL (ref 13.0–17.0)
MCHC: 35 g/dL (ref 30.0–36.0)
MCV: 94.2 fl (ref 78.0–100.0)
PLATELETS: 204 10*3/uL (ref 150.0–400.0)
RBC: 4.82 Mil/uL (ref 4.22–5.81)
RDW: 13.2 % (ref 11.5–15.5)
WBC: 6.9 10*3/uL (ref 4.0–10.5)

## 2018-02-07 LAB — LIPID PANEL
CHOL/HDL RATIO: 3
Cholesterol: 123 mg/dL (ref 0–200)
HDL: 36.8 mg/dL — ABNORMAL LOW (ref >39.00)
LDL CALC: 53 mg/dL (ref 0–99)
NonHDL: 86.46
Triglycerides: 167 mg/dL — ABNORMAL HIGH (ref 0.0–149.0)
VLDL: 33.4 mg/dL (ref 0.0–40.0)

## 2018-02-07 LAB — COMPREHENSIVE METABOLIC PANEL
ALT: 17 U/L (ref 0–53)
AST: 39 U/L — AB (ref 0–37)
Albumin: 4.3 g/dL (ref 3.5–5.2)
Alkaline Phosphatase: 47 U/L (ref 39–117)
BUN: 19 mg/dL (ref 6–23)
CALCIUM: 9.5 mg/dL (ref 8.4–10.5)
CO2: 30 meq/L (ref 19–32)
CREATININE: 1.08 mg/dL (ref 0.40–1.50)
Chloride: 102 mEq/L (ref 96–112)
GFR: 70.31 mL/min (ref >60.00)
Glucose, Bld: 137 mg/dL — ABNORMAL HIGH (ref 70–99)
Potassium: 3.6 mEq/L (ref 3.5–5.1)
Sodium: 138 mEq/L (ref 135–145)
Total Bilirubin: 0.8 mg/dL (ref 0.2–1.2)
Total Protein: 7.1 g/dL (ref 6.0–8.3)

## 2018-02-07 LAB — HEMOGLOBIN A1C: HEMOGLOBIN A1C: 6.2 % (ref 4.6–6.5)

## 2018-02-07 MED ORDER — SIMVASTATIN 40 MG PO TABS: 40.0000 mg | ORAL_TABLET | Freq: Every day | ORAL | 3 refills | Status: DC

## 2018-02-07 MED ORDER — LISINOPRIL-HYDROCHLOROTHIAZIDE 20-12.5 MG PO TABS: 1.0000 | ORAL_TABLET | Freq: Every day | ORAL | 3 refills | Status: DC

## 2018-02-07 MED ORDER — METFORMIN HCL ER 500 MG PO TB24: 500.0000 mg | ORAL_TABLET | Freq: Every day | ORAL | 3 refills | Status: DC

## 2018-02-07 MED ORDER — OMEPRAZOLE 20 MG PO CPDR: 20.0000 mg | DELAYED_RELEASE_CAPSULE | Freq: Every day | ORAL | 3 refills | Status: DC

## 2018-02-07 NOTE — Progress Notes (Signed)
Pre visit review using our clinic review tool, if applicable. No additional management support is needed unless otherwise documented below in the visit note. 

## 2018-02-07 NOTE — Patient Instructions (Addendum)
OK to check sugars as much or as little as you want.  Stop Metformin for now.   You don't need to be fasting for your next appointment.   Keep up the good work.   Let us know if you need anything.

## 2018-02-07 NOTE — Progress Notes (Signed)
Chief Complaint  Patient presents with  . Annual Exam    Well Male Howard Fisher is here for a complete physical.   His last physical was >1 year ago.  Current diet: in general, a "healthy" diet.   Current exercise: walking Weight trend: lost a few lbs by cleaning up diet Does pt snore? No. Daytime fatigue? No. Seat belt? Yes.    Health maintenance Tetanus- Yes Pneumonia vaccine- Yes  Past Medical History:  Diagnosis Date  . Allergic rhinitis   . 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 Borderline diabetes mellitus   . Chicken pox   . Diabetes mellitus without complication ([place])   . Dyspnea   . GERD (gastroesophageal reflux disease)   . Hiatal hernia   . History of stomach ulcers    1985  . Hypercholesteremia   . Hypertension   . Lumbar back pain   . Melanoma ([place]) 2014   shoulder  . Parkinson disease ([place])   . Peyronie's disease   . Sinusitis     Past Surgical History:  Procedure Laterality Date  . basal cell removed from face  12/2012  . CATARACT EXTRACTION Bilateral 11/2009   Dr. Charise Fisher  . CHOLECYSTECTOMY  02/1993   Dr. Harlow Fisher  . right hernia repair  2002   Dr. Deon Fisher  . skin cancer removed from right shoulder  11/2013   melanoma   Medications  Current Outpatient Medications on File Prior to Visit  Medication Sig Dispense Refill  . ACCU-CHEK AVIVA PLUS test strip USE AS DIRECTED 100 each 5  . ACCU-CHEK SOFTCLIX LANCETS lancets Use as instructed 100 each 5  . aspirin 81 MG tablet Take 81 mg by mouth daily.     . Blood Glucose Monitoring Suppl (ACCU-CHEK AVIVA PLUS) W/DEVICE KIT Test blood sugar once daily 1 kit 0  . carbidopa-levodopa (SINEMET IR) 25-100 MG tablet Take 1 tablet by mouth 3 (three) times daily. 90 tablet 11  . lisinopril-hydrochlorothiazide (PRINZIDE,ZESTORETIC) 20-12.5 MG tablet Take 1 tablet by mouth daily. 90 tablet 1  . metFORMIN (GLUCOPHAGE-XR) 500 MG 24 hr tablet Take 1 tablet (500 mg total) by mouth daily with breakfast. 90 tablet 1   . omeprazole (PRILOSEC) 20 MG capsule Take 1 capsule (20 mg total) by mouth daily. 90 capsule 1  . rasagiline (AZILECT) 1 MG TABS tablet Take 1 tablet (1 mg total) by mouth daily. 100 tablet 3  . simvastatin (ZOCOR) 40 MG tablet Take 1 tablet (40 mg total) by mouth at bedtime. 90 tablet 1   Allergies Allergies  Allergen Reactions  . Penicillins     REACTION: rash and itching   Family History Family History  Problem Relation Age of Onset  . Diabetes Mother   . Dementia Mother   . Dementia Father   . Diabetes Sister   . Healthy Son        x2  . Healthy Daughter        x1  . Colon cancer Neg Hx     Review of Systems: Constitutional:  no fevers or chills Eye:  no recent significant change in vision Ear/Nose/Mouth/Throat:  Ears:  no tinnitus or hearing loss Nose/Mouth/Throat:  no complaints of nasal congestion or bleeding, no sore throat and oral sores Cardiovascular:  no chest pain, no palpitations Respiratory:  no cough and no shortness of breath Gastrointestinal:  no abdominal pain, no change in bowel habits, no nausea, vomiting, diarrhea, or constipation and no black or bloody stool GU:  Male: negative for dysuria, frequency, and incontinence and negative for prostate symptoms Musculoskeletal/Extremities:  no pain, redness, or swelling of the joints Integumentary (Skin):  no abnormal skin lesions reported Neurologic: +tremor, no headaches, no numbness, tingling Endocrine:  No unexpected weight changes Hematologic/Lymphatic:  no night sweats  Exam BP 107/62 (BP Location: Left Arm, Patient Position: Sitting, Cuff Size: Normal)   Pulse 84   Temp 98 F (36.7 C) (Oral)   Ht '6\' 1"'  (1.854 m)   Wt 212 lb 6 oz (96.3 kg)   SpO2 93%   BMI 28.02 kg/m  General:  well developed, well nourished, in no apparent distress Skin:  no significant moles, warts, or growths Head:  no masses, lesions, or tenderness Eyes:  pupils equal and round, sclera anicteric without injection Ears:   canals without lesions, TMs shiny without retraction, no obvious effusion, no erythema Nose:  nares patent, septum midline, mucosa normal Throat/Pharynx:  lips and gingiva without lesion; tongue and uvula midline; non-inflamed pharynx; no exudates or postnasal drainage Neck: neck supple without adenopathy, thyromegaly, or masses Lungs:  clear to auscultation, breath sounds equal bilaterally, no respiratory distress Cardio:  regular rate and rhythm without murmurs, heart sounds without clicks or rubs Abdomen:  abdomen soft, nontender; bowel sounds normal; no masses or organomegaly Genital (male): circumcised penis, no lesions or discharge; testes present bilaterally without masses or tenderness Rectal: Deferred Musculoskeletal:  symmetrical muscle groups noted without atrophy or deformity Extremities:  no clubbing, cyanosis, or edema, no deformities, no skin discoloration Neuro: +resting tremor of RUE; deep tendon reflexes normal and symmetric Psych: well oriented with normal range of affect and appropriate judgment/insight  Assessment and Plan  Well adult exam - Plan: Comprehensive metabolic panel, CBC, Lipid panel, Hemoglobin A1c  Parkinson disease (HCC)  Controlled type 2 diabetes mellitus without complication, without long-term current use of insulin ([place])   Well 77 y.o. male. Given most recent A1c, will stop Metformin, ck sugars prn only.  Counseled on diet and exercise. Other orders as above. Follow up in 6 mo for med ck.  The patient voiced understanding and agreement to the plan.  [name], DO 02/07/18 1:49 PM

## 2018-02-08 ENCOUNTER — Telehealth: Payer: Self-pay | Admitting: Family Medicine

## 2018-02-08 ENCOUNTER — Other Ambulatory Visit: Payer: Self-pay | Admitting: Family Medicine

## 2018-02-08 DIAGNOSIS — R740 Nonspecific elevation of levels of transaminase and lactic acid dehydrogenase [LDH]: Principal | ICD-10-CM

## 2018-02-08 DIAGNOSIS — R7401 Elevation of levels of liver transaminase levels: Secondary | ICD-10-CM

## 2018-02-08 NOTE — Telephone Encounter (Addendum)
Pt called and given lab results.Documented in result note. Lab appt not scheduled and pt will need to have a return call with how soon he needs to come in to have lab appt. This information was not specified in result note. Pt states he has an OV scheduled in 6 months and wanted to know if it was ok to have  lab appt scheduled at that time to have labs drawn or if he would need to come in for a lab appt sooner. Attempted to call the office x 2 to receive clarification for lab appt, but no answer.

## 2018-02-08 NOTE — Telephone Encounter (Signed)
Copied from [place]. Topic: Quick Communication - See Telephone Encounter >> Feb 08, 2018 11:55 AM Bea Graff, NT wrote: CRM for notification. See Telephone encounter for: PT calling to get lab results.   02/08/18.

## 2018-02-08 NOTE — Telephone Encounter (Signed)
Called the patient back to schedule lab appt.

## 2018-02-12 ENCOUNTER — Other Ambulatory Visit (INDEPENDENT_AMBULATORY_CARE_PROVIDER_SITE_OTHER): Payer: Medicare HMO

## 2018-02-12 ENCOUNTER — Telehealth: Payer: Self-pay | Admitting: Family Medicine

## 2018-02-12 DIAGNOSIS — R7401 Elevation of levels of liver transaminase levels: Secondary | ICD-10-CM

## 2018-02-12 DIAGNOSIS — R740 Nonspecific elevation of levels of transaminase and lactic acid dehydrogenase [LDH]: Secondary | ICD-10-CM | POA: Diagnosis not present

## 2018-02-12 LAB — HEPATIC FUNCTION PANEL
ALK PHOS: 45 U/L (ref 39–117)
ALT: 12 U/L (ref 0–53)
AST: 29 U/L (ref 0–37)
Albumin: 4.2 g/dL (ref 3.5–5.2)
BILIRUBIN DIRECT: 0.1 mg/dL (ref 0.0–0.3)
BILIRUBIN TOTAL: 0.9 mg/dL (ref 0.2–1.2)
TOTAL PROTEIN: 6.9 g/dL (ref 6.0–8.3)

## 2018-02-12 NOTE — Telephone Encounter (Signed)
Copied from [place] [id number]. Topic: Quick Communication - Rx Refill/Question >> Feb 12, 2018  1:55 PM Oliver Pila B wrote: Pt called b/c a change is needed for this Rx, pt has talked w/ [facility] already but the Rx is needing a  90 day supply which would give the pill count up to 270, b/c the pt takes 3 pills a day, contact pt to advise  Medication:  carbidopa-levodopa (SINEMET IR) 25-100 MG tablet [[id number]]    Has the patient contacted their pharmacy? Yes.     (Agent: If no, request that the patient contact the pharmacy for the refill.)   Preferred Pharmacy (with phone number or street name): [facility]    Agent: Please be advised that RX refills may take up to 3 business days. We ask that you follow-up with your pharmacy.

## 2018-02-13 NOTE — Telephone Encounter (Signed)
Dr. Krista Blue, his neurologist is rx'ing this. He needs to contact that office. TY.

## 2018-02-13 NOTE — Telephone Encounter (Signed)
Called left message to call back 

## 2018-03-08 DIAGNOSIS — E119 Type 2 diabetes mellitus without complications: Secondary | ICD-10-CM | POA: Diagnosis not present

## 2018-03-08 DIAGNOSIS — D3132 Benign neoplasm of left choroid: Secondary | ICD-10-CM | POA: Diagnosis not present

## 2018-03-08 DIAGNOSIS — H04209 Unspecified epiphora, unspecified lacrimal gland: Secondary | ICD-10-CM | POA: Diagnosis not present

## 2018-03-26 ENCOUNTER — Telehealth: Payer: Self-pay | Admitting: Family Medicine

## 2018-03-26 ENCOUNTER — Other Ambulatory Visit: Payer: Self-pay | Admitting: *Deleted

## 2018-03-26 MED ORDER — GLUCOSE BLOOD VI STRP: ORAL_STRIP | 5 refills | Status: DC

## 2018-03-26 NOTE — Telephone Encounter (Signed)
Copied from [place] [id number]. Topic: Quick Communication - Rx Refill/Question >> Mar 26, 2018 12:52 PM Percell Belt A wrote: Medication ACCU-CHEK AVIVA PLUS test strip [[id number]]  and swabs  Has the patient contacted their pharmacy? No  (Agent: If no, request that the patient contact the pharmacy for the refill.) Preferred Pharmacy (with phone number or street name): [facility] in [place] [phone] Agent: Please be advised that RX refills may take up to 3 business days. We ask that you follow-up with your pharmacy.

## 2018-03-26 NOTE — Telephone Encounter (Signed)
Contacted pt to verify pharmacy; he states that he uses [facility].

## 2018-03-27 ENCOUNTER — Telehealth: Payer: Self-pay | Admitting: Family Medicine

## 2018-03-27 MED ORDER — GLUCOSE BLOOD VI STRP: ORAL_STRIP | 5 refills | Status: DC

## 2018-03-27 NOTE — Telephone Encounter (Signed)
Sent in to pharmacy.  

## 2018-03-27 NOTE — Telephone Encounter (Signed)
Copied from [place] [id number]. Topic: General - Other >> Mar 27, 2018 12:27 PM Cecelia Byars, NT wrote: Reason for [id number] pharmacy called and need  a prescription for  glucose blood (ACCU-CHEK AVIVA PLUS) test strip resent per medicare protocol , they are in need of a diagnosis code and specific instructions sent to  [place], [place] [phone] (Phone) [phone] (Fax)

## 2018-04-01 DIAGNOSIS — R69 Illness, unspecified: Secondary | ICD-10-CM | POA: Diagnosis not present

## 2018-04-01 MED ORDER — ONETOUCH DELICA LANCETS 33G MISC: 6 refills | Status: DC

## 2018-04-01 MED ORDER — GLUCOSE BLOOD VI STRP: ORAL_STRIP | 6 refills | Status: DC

## 2018-04-01 MED ORDER — ONETOUCH ULTRA 2 W/DEVICE KIT: PACK | 0 refills | Status: DC

## 2018-04-01 NOTE — Addendum Note (Signed)
Addended by: Sharon Seller B on: 04/01/2018 04:46 PM   Modules accepted: Orders

## 2018-04-01 NOTE — Telephone Encounter (Signed)
New testing supplies sent to pharmacy

## 2018-04-01 NOTE — Telephone Encounter (Signed)
Pt calling and states that his insurance will no longer cover the Accu Check and he needs the One Touch that you don't have to prick your finger. (Pt is unsure of the name of the exact med).  [place], [place] [phone] (Phone) [phone] (Fax)

## 2018-04-02 DIAGNOSIS — R69 Illness, unspecified: Secondary | ICD-10-CM | POA: Diagnosis not present

## 2018-04-03 ENCOUNTER — Telehealth: Payer: Self-pay | Admitting: Family Medicine

## 2018-04-03 NOTE — Telephone Encounter (Signed)
done

## 2018-04-03 NOTE — Telephone Encounter (Signed)
Copied from [place] [id number]. Topic: Quick Communication - Rx Refill/Question >> Apr 03, 2018 10:04 AM Lennox Solders wrote: Medication: new rx one touch . Pt is dm. Pt stated his pharm said his ins will cover one touch [address] [address] in [place]

## 2018-04-05 DIAGNOSIS — R69 Illness, unspecified: Secondary | ICD-10-CM | POA: Diagnosis not present

## 2018-04-24 ENCOUNTER — Ambulatory Visit: Payer: Self-pay | Admitting: *Deleted

## 2018-04-24 NOTE — Telephone Encounter (Signed)
Pt is pre diabetic and stumped his toe 16 years ago and he hit his toe again the other day and it knocked the nail just came off the other day and it bleed a little bit but not bad. He just wants to know if he needs to come in and have it checked out or what precautions he should take.   Call to patient - patient reports he had a damaged nail on his L great toe. He stumped his toe and the nail came off. He has been cleaning the nailbed and taking care of it since the nail came off and he reports the new nail has appeared. He reports everything looks good- color is good, no sign of infection, no pain reported, patient is taking good care to prevent any problems. Reviewed wound care with patient and the signs of infection. He will call if he has any concerns at all.  Reason for Disposition . [1] Toenail comes off or is almost off AND [2] follows old injury  Answer Assessment - Initial Assessment Questions 1. MECHANISM: "How did the injury happen?"      Old injury to L toe- patient hit the nail and it came off 2. ONSET: "When did the injury happen?" (Minutes or hours ago)      Saturday 3. LOCATION: "What part of the toe is injured?" "Is the nail damaged?"      L foot- great toe 4. APPEARANCE of TOE INJURY: "What does the injury look like?"      Looks healthy and new nail is appearing 5. SEVERITY: "Can you use the foot normally?" "Can you walk?"      Patient states he does not have pain and he is walking normally 6. SIZE: For cuts, bruises, or swelling, ask: "How large is it?" (e.g., inches or centimeters;  entire toe)      Nailbed is reported as healthy and pink 7. PAIN: "Is there pain?" If so, ask: "How bad is the pain?"   (e.g., Scale 1-10; or mild, moderate, severe)     No pain repoted 8. TETANUS: For any breaks in the skin, ask: "When was the last tetanus booster?"     Up to date 9. DIABETES: "Do you have a history of diabetes or poor circulation in the feet?"     Patient reports he is pre  diabetic and is taking care to clean and care for nailbed- he reports it looks good and the new nail is appearing. 10. OTHER SYMPTOMS: "Do you have any other symptoms?"        No other symptoms 11. PREGNANCY: "Is there any chance you are pregnant?" "When was your last menstrual period?"       n/a  Protocols used: TOE INJURY-A-AH

## 2018-05-21 DIAGNOSIS — C44722 Squamous cell carcinoma of skin of right lower limb, including hip: Secondary | ICD-10-CM | POA: Diagnosis not present

## 2018-05-21 DIAGNOSIS — D485 Neoplasm of uncertain behavior of skin: Secondary | ICD-10-CM | POA: Diagnosis not present

## 2018-05-21 DIAGNOSIS — Z85820 Personal history of malignant melanoma of skin: Secondary | ICD-10-CM | POA: Diagnosis not present

## 2018-05-21 DIAGNOSIS — Z08 Encounter for follow-up examination after completed treatment for malignant neoplasm: Secondary | ICD-10-CM | POA: Diagnosis not present

## 2018-07-15 ENCOUNTER — Ambulatory Visit: Payer: Medicare HMO | Admitting: Neurology

## 2018-07-15 ENCOUNTER — Encounter: Payer: Self-pay | Admitting: Neurology

## 2018-07-15 DIAGNOSIS — G20 Parkinson's disease: Secondary | ICD-10-CM

## 2018-07-15 MED ORDER — CARBIDOPA-LEVODOPA 25-100 MG PO TABS: 1.0000 | ORAL_TABLET | Freq: Three times a day (TID) | ORAL | 4 refills | Status: DC

## 2018-07-15 MED ORDER — RASAGILINE MESYLATE 1 MG PO TABS: 1.0000 mg | ORAL_TABLET | Freq: Every day | ORAL | 3 refills | Status: DC

## 2018-07-15 NOTE — Progress Notes (Signed)
PATIENT: Howard Fisher DOB: 05-26-1941  Chief Complaint  Patient presents with  . Parkinson's Disease    He is here with his wife, [name]. Overall, feels he is doing well.  He has noticed some throbbing pain in his shoulders, especially with activities like raising his arms above his head.  He has been using OTC NSAIDS to help with the discomfort.  He has also been under added stress the last few month due to downsizing and moving.     HISTORICAL  Howard Fisher is a 77 year old male, seen in refer by his primary care doctor Shelda Pal, for evaluation of tremor, involving his right arm and right hand, initial evaluation was on October 09 2017.  I have reviewed and summarized referring note, he has history of hypertension, diabetes, hyperlipidemia,  Since May of 2018, he noticed gradual onset right hand tremor, most noticeable when he is resting his right arm, he denies significant weakness, no left hand involvement,  He had a history of right shoulder injury in 2016 after he fell and landed on his right shoulder.  He denies loss sense of smell, he denies REM sleep disorder, no dizziness, chronic constipation, no family history of Parkinson's disease, he denies gait abnormality.   MRI of the brain November 2017 at the [facility] showed atrophy and small vessel disease no acute abnormality.  Laboratory evaluation in August 2018, normal magnesium, CBC, lipid profile, A1c was 6.1, normal TSH,  Update January 15, 2018. 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   He started  to take Azilect 1 mg daily since October, getting prescription from [place], he denies significant sore, no significant improvement  UPDATE July 15 2018: He is accompanied by his wife at today's clinical visit, he is taking Sinemet 25/100 mg 3 times a day at  9, 2, 8 PM, no significant side effect noticed, there was no wearing off noticed, he has mild limitation mainly due to pain in his shoulders, no significant gait abnormality,  He has a lot of stress due to recent move, has been moody  REVIEW OF SYSTEMS: Full 14 system review of systems performed and notable only for tremor, joint pain, achy muscles, runny nose, eye discharge, eye itching  ALLERGIES: Allergies  Allergen Reactions  . Penicillins     REACTION: rash and itching    HOME MEDICATIONS: Current Outpatient Medications  Medication Sig Dispense Refill  . aspirin 81 MG tablet Take 81 mg by mouth daily.     . Blood Glucose Monitoring Suppl (ONE TOUCH ULTRA 2) w/Device KIT Use once daily to check blood sugar.  DX E11.9 1 each 0  . carbidopa-levodopa (SINEMET IR) 25-100 MG tablet Take 1 tablet by mouth 3 (three) times daily. 90 tablet 11  . glucose blood (ONE TOUCH ULTRA TEST) test strip Use once daily to check blood sugar.  DXE11.9 50 each 6  . lisinopril-hydrochlorothiazide (PRINZIDE,ZESTORETIC) 20-12.5 MG tablet Take 1 tablet by mouth daily. 90 tablet 3  . omeprazole (PRILOSEC) 20 MG capsule Take 1 capsule (20 mg total) by mouth daily. 90 capsule 3  . ONETOUCH DELICA LANCETS 67T MISC Use once daily to check blood sugar.  DX E11.9 100 each 6  . rasagiline (AZILECT) 1 MG TABS tablet Take 1 tablet (1 mg total) by mouth daily. 100 tablet 3  . simvastatin (ZOCOR) 40 MG tablet  Take 1 tablet (40 mg total) by mouth at bedtime. 90 tablet 3   No current facility-administered medications for this visit.     PAST MEDICAL HISTORY: Past Medical History:  Diagnosis Date  . Allergic rhinitis   . 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 Diabetes mellitus without complication ([place])   . GERD (gastroesophageal reflux disease)   . Hiatal hernia   . History of stomach ulcers    1985  . Hypercholesteremia   . Hypertension   . Melanoma ([place]) 2014   shoulder  . Parkinson disease ([place])   .  Peyronie's disease     PAST SURGICAL HISTORY: Past Surgical History:  Procedure Laterality Date  . basal cell removed from face  12/2012  . CATARACT EXTRACTION Bilateral 11/2009   Dr. Charise Killian  . CHOLECYSTECTOMY  02/1993   Dr. Harlow Asa  . right hernia repair  2002   Dr. Deon Pilling  . skin cancer removed from right shoulder  11/2013   melanoma    FAMILY HISTORY: Family History  Problem Relation Age of Onset  . Diabetes Mother   . Dementia Mother   . Dementia Father   . Diabetes Sister   . Healthy Son        x2  . Healthy Daughter        x1  . Colon cancer Neg Hx     SOCIAL HISTORY:  Social History   Socioeconomic History  . Marital status: Married    Spouse name: Not on file  . Number of children: 3  . Years of education: One year college  . Highest education level: Not on file  Occupational History  . Occupation: retired Investment banker, operational  . Financial resource strain: Not on file  . Food insecurity:    Worry: Not on file    Inability: Not on file  . Transportation needs:    Medical: Not on file    Non-medical: Not on file  Tobacco Use  . Smoking status: Never Smoker  . Smokeless tobacco: Never Used  Substance and Sexual Activity  . Alcohol use: Yes    Alcohol/week: 0.6 oz    Types: 1 Glasses of wine per week    Comment: 3 drinks per week  . Drug use: No  . Sexual activity: Not on file  Lifestyle  . Physical activity:    Days per week: Not on file    Minutes per session: Not on file  . Stress: Not on file  Relationships  . Social connections:    Talks on phone: Not on file    Gets together: Not on file    Attends religious service: Not on file    Active member of club or organization: Not on file    Attends meetings of clubs or organizations: Not on file    Relationship status: Not on file  . Intimate partner violence:    Fear of current or ex partner: Not on file    Emotionally abused: Not on file    Physically abused: Not on file    Forced  sexual activity: Not on file  Other Topics Concern  . Not on file  Social History Narrative   Lives at home with his wife.   Right-handed.   2 cups coffee in the morning.     PHYSICAL EXAM   Vitals:   07/15/18 1014  BP: (!) 143/79  Pulse: 81  Weight: 205 lb 8 oz (93.2 kg)  Height: 6'  1" (1.854 m)    Not recorded      Body mass index is 27.11 kg/m.  PHYSICAL EXAMNIATION:  Gen: NAD, conversant, well nourised, obese, well groomed                     Cardiovascular: Regular rate rhythm, no peripheral edema, warm, nontender. Eyes: Conjunctivae clear without exudates or hemorrhage Neck: Supple, no carotid bruits. Pulmonary: Clear to auscultation bilaterally   NEUROLOGICAL EXAM:  MENTAL STATUS: Speech:    Speech is normal; fluent and spontaneous with normal comprehension.  Cognition:     Orientation to time, place and person     Normal recent and remote memory     Normal Attention span and concentration     Normal Language, naming, repeating,spontaneous speech     Fund of knowledge   CRANIAL NERVES: CN II: Visual fields are full to confrontation. Fundoscopic exam is normal with sharp discs and no vascular changes. Pupils are round equal and briskly reactive to light. CN III, IV, VI: extraocular movement are normal. No ptosis. CN V: Facial sensation is intact to pinprick in all 3 divisions bilaterally. Corneal responses are intact.  CN VII: Face is symmetric with normal eye closure and smile. CN VIII: Hearing is normal to rubbing fingers CN IX, X: Palate elevates symmetrically. Phonation is normal. CN XI: Head turning and shoulder shrug are intact CN XII: Tongue is midline with normal movements and no atrophy.  MOTOR: Right hand resting tremor, no significant weakness, mild bradykinesia, rigidity with reinforcement maneuver, right worse than left.  REFLEXES: Reflexes are 2+ and symmetric at the biceps, triceps, knees, and ankles. Plantar responses are  flexor.  SENSORY: Intact to light touch, pinprick, positional sensation and vibratory sensation are intact in fingers and toes.  COORDINATION: Rapid alternating movements and fine finger movements are intact. There is no dysmetria on finger-to-nose and heel-knee-shin.    GAIT/STANCE: Posture is normal. Gait is steady with normal steps, base, he has decreased right arm swing, more noticeable right hand tremor Romberg is absent.   DIAGNOSTIC DATA (LABS, IMAGING, TESTING) - I reviewed patient records, labs, notes, testing and imaging myself where available.   ASSESSMENT AND PLAN  Howard Fisher is a 77 y.o. male    Idiopathic Parkinson's disease.     DATscan showed dominant deficiency of bilateral basal ganglion, worsening on the left side  Continue Azilect 3m daily  Keep Sinemet 25/100 mg 3 times a day, change the time of medicine to 7 AM, 12, 5 PM  Also encouraged him moderate exercise  YMarcial Pacas M.D. Ph.D.  [facility] [address] [place] [id number]: [phone]: [phone] CC: WShelda Pal D

## 2018-07-16 ENCOUNTER — Ambulatory Visit: Payer: Medicare HMO | Admitting: Neurology

## 2018-09-17 ENCOUNTER — Ambulatory Visit: Payer: Self-pay

## 2018-09-17 NOTE — Telephone Encounter (Signed)
He called in c/o his neck hurting when he turns his head to the left.   It feels like a catch in my neck.   This has been going on for 3 days.   The doctor told me to let him know if my neck started to hurt because it could be a side effect of the Parkinson's.     "I mainly want to know if this is a side effect of Parkinson's?  See triage notes. I have routed a note to Dr. Irene Limbo nurse pool with the pt's question.   I let him know someone would be in contact with him from the office.  He was agreeable to this plan.   Reason for Disposition . [1] Age > 66 AND [2] no history of prior similar neck pain  Answer Assessment - Initial Assessment Questions 1. ONSET: "When did the pain begin?"      I have Parkinson's.   The doctor mentioned if my neck hurts to let him know. Last 3 days it feels like a catch in my neck when I turn to the left. 2. LOCATION: "Where does it hurt?"      Left side of my neck when I turn head to the left. 3. PATTERN "Does the pain come and go, or has it been constant since it started?"      When I turn my head to the left.   Yesterday it hurt all day long.   4. SEVERITY: "How bad is the pain?"  (Scale 1-10; or mild, moderate, severe)   - MILD (1-3): doesn't interfere with normal activities    - MODERATE (4-7): interferes with normal activities or awakens from sleep    - SEVERE (8-10):  excruciating pain, unable to do any normal activities      6-7 on pain scale. 5. RADIATION: "Does the pain go anywhere else, shoot into your arms?"     No 6. CORD SYMPTOMS: "Any weakness or numbness of the arms or legs?"     No  If I move too quick I feel dizzy just on occasion.   I've noticed over the last 4 weeks.   I'm trying to sell my house and I'm under a lot of stress. 7. CAUSE: "What do you think is causing the neck pain?"     I'm wondering if it's a side effect of Parkinson's.    8. NECK OVERUSE: "Any recent activities that involved turning or twisting the neck?"     No  accidents. 9. OTHER SYMPTOMS: "Do you have any other symptoms?" (e.g., headache, fever, chest pain, difficulty breathing, neck swelling)     Sometimes in the morning the back of my neck hurts. 10. PREGNANCY: "Is there any chance you are pregnant?" "When was your last menstrual period?"       N/A  Protocols used: NECK PAIN OR STIFFNESS-A-AH

## 2018-09-17 NOTE — Telephone Encounter (Signed)
Please advise 

## 2018-09-18 NOTE — Telephone Encounter (Signed)
I think he needs to call his neurologist. I believe the neurology team told him this. TY.

## 2018-09-19 ENCOUNTER — Telehealth: Payer: Self-pay | Admitting: Neurology

## 2018-09-19 NOTE — Telephone Encounter (Signed)
Author phoned pt. To relay Dr. Irene Limbo message. Pt. Appreciative of advice.

## 2018-09-19 NOTE — Telephone Encounter (Signed)
Spoke to patient - states his pain feels like a bad "crick in his neck".  He is questioning whether he may have slept on it wrong.  He does not recall an injury.  The pain has been present for nearly two weeks.  He is going to try NSAIDS and warm compresses.  If the pain persist, he is going to call his PCP to have it further evaluated.

## 2018-09-19 NOTE — Telephone Encounter (Signed)
Pt requesting a call in regards to carbidopa-levodopa (SINEMET IR) 25-100 MG tablet stating he is having off and on neck pain wanting to know if this could be a s/e please advise

## 2018-10-22 ENCOUNTER — Telehealth: Payer: Self-pay | Admitting: Neurology

## 2018-10-22 NOTE — Telephone Encounter (Signed)
Pt requesting a call stating he had been looking over the medication bottle for rasagiline (AZILECT) 1 MG TABS tablet saw that it stated to stay away from cheese wanting to know the s/e that could arise with cheese intake

## 2018-10-22 NOTE — Telephone Encounter (Signed)
Spoke to Dr. Krista Blue.  The patient should eat aged cheese in moderation.  He is aware and verbalized understanding.

## 2018-10-28 ENCOUNTER — Other Ambulatory Visit: Payer: Self-pay | Admitting: *Deleted

## 2018-10-28 ENCOUNTER — Telehealth: Payer: Self-pay | Admitting: Neurology

## 2018-10-28 MED ORDER — RASAGILINE MESYLATE 1 MG PO TABS: 1.0000 mg | ORAL_TABLET | Freq: Every day | ORAL | 11 refills | Status: DC

## 2018-10-28 NOTE — Telephone Encounter (Signed)
Rx printed, signed by Dr. Krista Blue and faxed to the requested pharmacy below.  Confirmation received.

## 2018-10-28 NOTE — Telephone Encounter (Signed)
Pts wife Jana Half on [place] requesting refills for rasagiline (AZILECT) 1 MG TABS tablet sent to a pharmacy in [place] [facility] ([facility]) [phone] please advise

## 2018-11-11 DIAGNOSIS — R69 Illness, unspecified: Secondary | ICD-10-CM | POA: Diagnosis not present

## 2018-11-18 DIAGNOSIS — R69 Illness, unspecified: Secondary | ICD-10-CM | POA: Diagnosis not present

## 2018-11-26 DIAGNOSIS — R69 Illness, unspecified: Secondary | ICD-10-CM | POA: Diagnosis not present

## 2018-12-09 DIAGNOSIS — Z08 Encounter for follow-up examination after completed treatment for malignant neoplasm: Secondary | ICD-10-CM | POA: Diagnosis not present

## 2018-12-09 DIAGNOSIS — L570 Actinic keratosis: Secondary | ICD-10-CM | POA: Diagnosis not present

## 2018-12-09 DIAGNOSIS — Z85828 Personal history of other malignant neoplasm of skin: Secondary | ICD-10-CM | POA: Diagnosis not present

## 2018-12-09 DIAGNOSIS — Z85820 Personal history of malignant melanoma of skin: Secondary | ICD-10-CM | POA: Diagnosis not present

## 2018-12-09 DIAGNOSIS — L821 Other seborrheic keratosis: Secondary | ICD-10-CM | POA: Diagnosis not present

## 2019-01-02 ENCOUNTER — Encounter: Payer: Self-pay | Admitting: Family Medicine

## 2019-01-02 ENCOUNTER — Ambulatory Visit (INDEPENDENT_AMBULATORY_CARE_PROVIDER_SITE_OTHER): Payer: Medicare HMO | Admitting: Family Medicine

## 2019-01-02 VITALS — BP 120/76 | HR 101 | Temp 98.6°F | Ht 73.0 in | Wt 210.1 lb

## 2019-01-02 DIAGNOSIS — J069 Acute upper respiratory infection, unspecified: Secondary | ICD-10-CM | POA: Diagnosis not present

## 2019-01-02 DIAGNOSIS — B9789 Other viral agents as the cause of diseases classified elsewhere: Secondary | ICD-10-CM | POA: Diagnosis not present

## 2019-01-02 MED ORDER — BENZONATATE 100 MG PO CAPS
100.0000 mg | ORAL_CAPSULE | Freq: Three times a day (TID) | ORAL | 0 refills | Status: DC | PRN
Start: 2019-01-02 — End: 2019-01-10

## 2019-01-02 NOTE — Progress Notes (Signed)
Chief Complaint  Patient presents with  . Cough    for 2 weeks    [name] here for URI complaints.  Duration: 2 weeks  Associated symptoms: rhinorrhea and cough Denies: sinus congestion, sinus pain, rhinorrhea, ear pain, ear drainage, sore throat, wheezing, shortness of breath, myalgia and fevers Treatment to date: Cold-eeze, Brandy w honey Sick contacts: No  ROS:  Const: Denies fevers HEENT: As noted in HPI Lungs: No SOB  Past Medical History:  Diagnosis Date  . Allergic rhinitis   . 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 Diabetes mellitus without complication ([place])   . GERD (gastroesophageal reflux disease)   . Hiatal hernia   . History of stomach ulcers    1985  . Hypercholesteremia   . Hypertension   . Melanoma ([place]) 2014   shoulder  . Parkinson disease ([place])   . Peyronie's disease     BP 120/76 (BP Location: Left Arm, Patient Position: Sitting, Cuff Size: Normal)   Pulse (!) 101   Temp 98.6 F (37 C) (Oral)   Ht 6\' 1"  (1.854 m)   Wt 210 lb 2 oz (95.3 kg)   SpO2 95%   BMI 27.72 kg/m  General: Awake, alert, appears stated age HEENT: AT, [place], ears patent b/l and TM's neg, nares patent w/o discharge, pharynx pink and without exudates, MMM Neck: No masses or asymmetry Heart: RRR Lungs: CTAB, no accessory muscle use Psych: Age appropriate judgment and insight, normal mood and affect  Viral URI with cough - Plan: benzonatate (TESSALON) 100 MG capsule  Orders as above. Continue to push fluids, practice good hand hygiene, cover mouth when coughing. F/u prn. If starting to experience fevers, shaking, or shortness of breath, seek immediate care. Pt voiced understanding and agreement to the plan.  [place], DO 01/02/19 10:18 AM

## 2019-01-02 NOTE — Patient Instructions (Signed)
Continue to push fluids, practice good hand hygiene, and cover your mouth if you cough.  If you start having fevers, shaking or shortness of breath, seek immediate care.  Let us know if you need anything.  

## 2019-01-02 NOTE — Progress Notes (Signed)
Pre visit review using our clinic review tool, if applicable. No additional management support is needed unless otherwise documented below in the visit note. 

## 2019-01-10 ENCOUNTER — Ambulatory Visit (INDEPENDENT_AMBULATORY_CARE_PROVIDER_SITE_OTHER): Payer: Medicare HMO | Admitting: Family Medicine

## 2019-01-10 ENCOUNTER — Encounter: Payer: Self-pay | Admitting: Family Medicine

## 2019-01-10 VITALS — BP 110/80 | HR 87 | Temp 98.3°F | Ht 73.0 in | Wt 211.0 lb

## 2019-01-10 DIAGNOSIS — K219 Gastro-esophageal reflux disease without esophagitis: Secondary | ICD-10-CM | POA: Diagnosis not present

## 2019-01-10 DIAGNOSIS — J069 Acute upper respiratory infection, unspecified: Secondary | ICD-10-CM | POA: Diagnosis not present

## 2019-01-10 DIAGNOSIS — B9789 Other viral agents as the cause of diseases classified elsewhere: Secondary | ICD-10-CM

## 2019-01-10 MED ORDER — GLUCOSE BLOOD VI STRP: ORAL_STRIP | 6 refills | Status: DC

## 2019-01-10 MED ORDER — OMEPRAZOLE 20 MG PO CPDR: 20.0000 mg | DELAYED_RELEASE_CAPSULE | Freq: Every day | ORAL | 3 refills | Status: DC

## 2019-01-10 MED ORDER — SIMVASTATIN 40 MG PO TABS: 40.0000 mg | ORAL_TABLET | Freq: Every day | ORAL | 3 refills | Status: DC

## 2019-01-10 MED ORDER — LISINOPRIL-HYDROCHLOROTHIAZIDE 20-12.5 MG PO TABS: 1.0000 | ORAL_TABLET | Freq: Every day | ORAL | 3 refills | Status: DC

## 2019-01-10 NOTE — Progress Notes (Signed)
Pre visit review using our clinic review tool, if applicable. No additional management support is needed unless otherwise documented below in the visit note. 

## 2019-01-10 NOTE — Progress Notes (Signed)
Chief Complaint  Patient presents with  . Cough    4 weeks    Howard Fisher here for URI complaints.  Duration: 3 weeks  Associated symptoms: cough; getting better Denies: sinus congestion, sinus pain, rhinorrhea, itchy watery eyes, ear pain, ear drainage, sore throat, wheezing, shortness of breath, myalgia and fevers Treatment to date: Tessalon Perles helped Sick contacts: No   GERD-hx of well controlled GERD on PPI. Wondering if he can come off of it.   ROS:  Const: Denies fevers HEENT: As noted in HPI Lungs: No SOB  Past Medical History:  Diagnosis Date  . Allergic rhinitis   . 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 Diabetes mellitus without complication ([place])   . GERD (gastroesophageal reflux disease)   . Hiatal hernia   . History of stomach ulcers    1985  . Hypercholesteremia   . Hypertension   . Melanoma ([place]) 2014   shoulder  . Parkinson disease ([place])   . Peyronie's disease     BP 110/80 (BP Location: Left Arm, Patient Position: Sitting, Cuff Size: Normal)   Pulse 87   Temp 98.3 F (36.8 C) (Oral)   Ht 6\' 1"  (1.854 m)   Wt 211 lb (95.7 kg)   SpO2 96%   BMI 27.84 kg/m  General: Awake, alert, appears stated age HEENT: AT, [place], ears patent b/l and TM's neg, nares patent w/o discharge, pharynx pink and without exudates, MMM Neck: No masses or asymmetry Heart: RRR Lungs: CTAB, no accessory muscle use Psych: Age appropriate judgment and insight, normal mood and affect  Viral URI with cough  Gastroesophageal reflux disease without esophagitis  Supportive care. Continue to push fluids, practice good hand hygiene, cover mouth when coughing. OK to stop PPI. If s/s's return, start back on it.  F/u as originally scheduled.  Pt voiced understanding and agreement to the plan.  [place], DO 01/10/19 1:21 PM

## 2019-01-10 NOTE — Patient Instructions (Addendum)
I think you will continue to get better.   OK to use Mucinex or robitussin to help with the cough. Keep pushing fluids.   Things to look out for: fevers, shortness of breath, body shakes  Sometimes people can get a post-infectious cough that can last 4-6 weeks after the initial infection.  Let us know if you need anything.

## 2019-01-13 DIAGNOSIS — R69 Illness, unspecified: Secondary | ICD-10-CM | POA: Diagnosis not present

## 2019-01-17 NOTE — Progress Notes (Signed)
[facility]  PATIENT: Howard Fisher DOB: 1941/12/16   REASON FOR VISIT: Follow-up for Parkinson's disease HISTORY FROM: Patient and wife    HISTORY OF PRESENT ILLNESS: Howard Fisher is a 78 year old male, seen in refer by his primary care doctor Shelda Pal, for evaluation of tremor, involving his right arm and right hand, initial evaluation was on October 09 2017.  I have reviewed and summarized referring note, he has history of hypertension, diabetes, hyperlipidemia,  Since May of 2018, he noticed gradual onset right hand tremor, most noticeable when he is resting his right arm, he denies significant weakness, no left hand involvement,  He had a history of right shoulder injury in 2016 after he fell and landed on his right shoulder.  He denies loss sense of smell, he denies REM sleep disorder, no dizziness, chronic constipation, no family history of Parkinson's disease, he denies gait abnormality.   MRI of the brain November 2017 at the [facility] showed atrophy and small vessel disease no acute abnormality.  Laboratory evaluation in August 2018, normal magnesium, CBC, lipid profile, A1c was 6.1, normal TSH,  Update January 15, 2018. 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  He started  to take Azilect 1 mg daily since October, getting prescription from [place], he denies significant sore, no significant improvement  UPDATE July 15 2018:YY He is accompanied by his wife at today's clinical visit, he is taking Sinemet 25/100 mg 3 times a day at 9, 2, 8 PM, no significant side effect noticed, there was no wearing off noticed, he has mild limitation mainly due to pain in his shoulders, no significant gait abnormality, He has a lot of stress due to recent move, has been moody UPDATE 1/27/2020CM  Howard Fisher, 78 year old male returns for follow-up with his wife.  He has Parkinson's disease.  His initial evaluation was in October 2018.  He is currently on Sinemet 3 times a day and Azilect.  He does not exercise.  He denies any falls appetite is good he is sleeping well.  He denies any freezing episodes he denies any swallowing difficulty.  He denies any significant resting tremor.  He returns for reevaluation REVIEW OF SYSTEMS: Full 14 system review of systems performed and notable only for those listed, all others are neg:  Constitutional: neg  Cardiovascular: neg Ear/Nose/Throat: neg  Skin: neg Eyes: neg Respiratory: neg Gastroitestinal: neg  Hematology/Lymphatic: neg  Endocrine: neg Musculoskeletal:neg Allergy/Immunology: neg Neurological: neg Psychiatric: neg Sleep : neg   ALLERGIES: Allergies  Allergen Reactions  . Penicillins     REACTION: rash and itching    HOME MEDICATIONS: Outpatient Medications Prior to Visit  Medication Sig Dispense Refill  . aspirin 81 MG tablet Take 81 mg by mouth daily.     . Blood Glucose Monitoring Suppl (ONE TOUCH ULTRA 2) w/Device KIT Use once daily to check blood sugar.  DX E11.9 1 each 0  . carbidopa-levodopa (SINEMET IR) 25-100 MG tablet Take 1 tablet by mouth 3 (three) times daily. 270 tablet 4  . glucose blood (ONE TOUCH ULTRA TEST) test strip Use once daily to check blood sugar.  DXE11.9 50 each 6  . lisinopril-hydrochlorothiazide (PRINZIDE,ZESTORETIC) 20-12.5 MG tablet Take 1 tablet by mouth daily. 90 tablet 3  . omeprazole (PRILOSEC) 20 MG capsule Take 1 capsule (20 mg total) by mouth daily. 90 capsule 3  .  ONETOUCH DELICA LANCETS 88B MISC Use once daily to check blood sugar.  DX E11.9 100 each 6  . rasagiline (AZILECT) 1 MG TABS tablet Take 1 tablet (1 mg total) by mouth daily. 100 tablet 11  . simvastatin (ZOCOR) 40 MG tablet Take 1 tablet (40 mg total) by mouth at bedtime. 90 tablet 3   No facility-administered medications  prior to visit.     PAST MEDICAL HISTORY: Past Medical History:  Diagnosis Date  . Allergic rhinitis   . 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 Diabetes mellitus without complication ([place])   . GERD (gastroesophageal reflux disease)   . Hiatal hernia   . History of stomach ulcers    1985  . Hypercholesteremia   . Hypertension   . Melanoma ([place]) 2014   shoulder  . Parkinson disease ([place])   . Peyronie's disease     PAST SURGICAL HISTORY: Past Surgical History:  Procedure Laterality Date  . basal cell removed from face  12/2012  . CATARACT EXTRACTION Bilateral 11/2009   Dr. Charise Killian  . CHOLECYSTECTOMY  02/1993   Dr. Harlow Asa  . right hernia repair  2002   Dr. Deon Pilling  . skin cancer removed from right shoulder  11/2013   melanoma    FAMILY HISTORY: Family History  Problem Relation Age of Onset  . Diabetes Mother   . Dementia Mother   . Dementia Father   . Diabetes Sister   . Healthy Son        x2  . Healthy Daughter        x1  . Colon cancer Neg Hx     SOCIAL HISTORY: Social History   Socioeconomic History  . Marital status: Married    Spouse name: [name]  . Number of children: 3  . Years of education: One year college  . Highest education level: Not on file  Occupational History  . Occupation: retired Investment banker, operational  . Financial resource strain: Not on file  . Food insecurity:    Worry: Not on file    Inability: Not on file  . Transportation needs:    Medical: Not on file    Non-medical: Not on file  Tobacco Use  . Smoking status: Never Smoker  . Smokeless tobacco: Never Used  Substance and Sexual Activity  . Alcohol use: Yes    Alcohol/week: 1.0 standard drinks    Types: 1 Glasses of wine per week    Comment: 3 drinks per week  . Drug use: No  . Sexual activity: Not on file  Lifestyle  . Physical activity:    Days per week: Not on file    Minutes per session: Not on file  . Stress: Not on file  Relationships  . Social connections:     Talks on phone: Not on file    Gets together: Not on file    Attends religious service: Not on file    Active member of club or organization: Not on file    Attends meetings of clubs or organizations: Not on file    Relationship status: Not on file  . Intimate partner violence:    Fear of current or ex partner: Not on file    Emotionally abused: Not on file    Physically abused: Not on file    Forced sexual activity: Not on file  Other Topics Concern  . Not on file  Social History Narrative   Lives at home with his wife.  Right-handed.   2 cups coffee in the morning.     PHYSICAL EXAM  Vitals:   01/20/19 1042  BP: 121/81  Pulse: 91  Weight: 215 lb (97.5 kg)  Height: '6\' 1"'$  (1.854 m)   Body mass index is 28.37 kg/m.  Generalized: Well developed, in no acute distress , well-groomed Head: normocephalic and atraumatic,. Oropharynx benign negative Myerson sign Neck: Supple,  Musculoskeletal: No deformity   Neurological examination   Mentation: Alert oriented to time, place, history taking. Attention span and concentration appropriate. Recent and remote memory intact.  Follows all commands speech and language fluent.   Cranial nerve II-XII: Pupils were equal round reactive to light extraocular movements were full, visual field were full on confrontational test. Facial sensation and strength were normal. hearing was intact to finger rubbing bilaterally. Uvula tongue midline. head turning and shoulder shrug were normal and symmetric.Tongue protrusion into cheek strength was normal. Motor: normal bulk and tone, full strength in the BUE, BLE, no bradykinesia no cogwheeling Sensory: normal and symmetric to light touch, pinprick, and  Vibration, in the upper and lower extremities  Coordination: finger-nose-finger, heel-to-shin bilaterally, no dysmetria Reflexes: Brachioradialis 2/2, biceps 2/2, triceps 2/2, patellar 2/2, Achilles 2/2, plantar responses were flexor bilaterally. Gait  and Station: Rising up from seated position without assistance, normal stance,  moderate stride, good bilateral arm swing, smooth turning, no assistive device DIAGNOSTIC DATA (LABS, IMAGING, TESTING) - I reviewed patient records, labs, notes, testing and imaging myself where available.  Lab Results  Component Value Date   WBC 6.9 02/07/2018   HGB 15.9 02/07/2018   HCT 45.4 02/07/2018   MCV 94.2 02/07/2018   PLT 204.0 02/07/2018      Component Value Date/Time   NA 138 02/07/2018 1337   K 3.6 02/07/2018 1337   CL 102 02/07/2018 1337   CO2 30 02/07/2018 1337   GLUCOSE 137 (H) 02/07/2018 1337   BUN 19 02/07/2018 1337   CREATININE 1.08 02/07/2018 1337   CREATININE 1.03 04/17/2014 1026   CALCIUM 9.5 02/07/2018 1337   PROT 6.9 02/12/2018 1017   ALBUMIN 4.2 02/12/2018 1017   AST 29 02/12/2018 1017   ALT 12 02/12/2018 1017   ALKPHOS 45 02/12/2018 1017   BILITOT 0.9 02/12/2018 1017   GFRNONAA 70.39 12/30/2009 1014   GFRAA 86 12/17/2008 0946   Lab Results  Component Value Date   CHOL 123 02/07/2018   HDL 36.80 (L) 02/07/2018   LDLCALC 53 02/07/2018   LDLDIRECT 94.6 01/15/2014   TRIG 167.0 (H) 02/07/2018   CHOLHDL 3 02/07/2018   Lab Results  Component Value Date   HGBA1C 6.2 02/07/2018   No results found for: [id number] Lab Results  Component Value Date   TSH 4.36 09/27/2016      ASSESSMENT AND PLAN  Howard Fisher is a 78 y.o. male  with idiopathic Parkinson's disease initially evaluated here October 2018.           PLAN: Continue Azilect '1mg'$  daily does not need refills             Keep Sinemet 25/100 mg 3 times a day, change the time of medicine to 7 AM, 12, 5 PM  does not need refills              Encouraged him moderate exercise, this is very important with Parkinson's disease  F/U in 6 months Dennie Bible, [facility], [facility], [facility] [address] [place], [place] [address] [phone])  [phone]

## 2019-01-20 ENCOUNTER — Encounter: Payer: Self-pay | Admitting: Nurse Practitioner

## 2019-01-20 ENCOUNTER — Ambulatory Visit: Payer: Medicare HMO | Admitting: Nurse Practitioner

## 2019-01-20 VITALS — BP 121/81 | HR 91 | Ht 73.0 in | Wt 215.0 lb

## 2019-01-20 DIAGNOSIS — G20 Parkinson's disease: Secondary | ICD-10-CM | POA: Diagnosis not present

## 2019-01-20 NOTE — Patient Instructions (Signed)
Continue Azilect 1mg  daily does not need refills             Keep Sinemet 25/100 mg 3 times a day, change the time of medicine to 7 AM, 12, 5 PM  does not need refills              Encouraged him moderate exercise  F/U in 6 months

## 2019-01-20 NOTE — Progress Notes (Signed)
I have reviewed and agreed above plan. 

## 2019-02-12 ENCOUNTER — Ambulatory Visit (INDEPENDENT_AMBULATORY_CARE_PROVIDER_SITE_OTHER): Payer: Medicare HMO | Admitting: Family Medicine

## 2019-02-12 ENCOUNTER — Encounter: Payer: Self-pay | Admitting: Family Medicine

## 2019-02-12 VITALS — BP 138/86 | HR 85 | Temp 98.1°F | Ht 73.0 in | Wt 213.5 lb

## 2019-02-12 DIAGNOSIS — E119 Type 2 diabetes mellitus without complications: Secondary | ICD-10-CM | POA: Diagnosis not present

## 2019-02-12 DIAGNOSIS — Z0000 Encounter for general adult medical examination without abnormal findings: Secondary | ICD-10-CM | POA: Diagnosis not present

## 2019-02-12 LAB — LIPID PANEL
CHOL/HDL RATIO: 3
CHOLESTEROL: 148 mg/dL (ref 0–200)
HDL: 44.3 mg/dL (ref >39.00)
LDL CALC: 78 mg/dL (ref 0–99)
NonHDL: 103.94
Triglycerides: 130 mg/dL (ref 0.0–149.0)
VLDL: 26 mg/dL (ref 0.0–40.0)

## 2019-02-12 LAB — HEMOGLOBIN A1C: Hgb A1c MFr Bld: 6.3 % (ref 4.6–6.5)

## 2019-02-12 LAB — COMPREHENSIVE METABOLIC PANEL
ALK PHOS: 57 U/L (ref 39–117)
ALT: 6 U/L (ref 0–53)
AST: 28 U/L (ref 0–37)
Albumin: 4.6 g/dL (ref 3.5–5.2)
BILIRUBIN TOTAL: 0.7 mg/dL (ref 0.2–1.2)
BUN: 12 mg/dL (ref 6–23)
CO2: 30 mEq/L (ref 19–32)
CREATININE: 1.04 mg/dL (ref 0.40–1.50)
Calcium: 9.4 mg/dL (ref 8.4–10.5)
Chloride: 100 mEq/L (ref 96–112)
GFR: 68.92 mL/min (ref >60.00)
GLUCOSE: 111 mg/dL — AB (ref 70–99)
Potassium: 3.8 mEq/L (ref 3.5–5.1)
SODIUM: 140 meq/L (ref 135–145)
Total Protein: 7.1 g/dL (ref 6.0–8.3)

## 2019-02-12 LAB — MICROALBUMIN / CREATININE URINE RATIO
Creatinine,U: 184 mg/dL
MICROALB UR: 0.8 mg/dL (ref 0.0–1.9)
Microalb Creat Ratio: 0.4 mg/g (ref 0.0–30.0)

## 2019-02-12 NOTE — Patient Instructions (Addendum)
Give [place] 2-3 business days to get the results of your labs back.   Keep the diet clean and stay active.  Call your eye doctor regarding your yearly visit.   Let me know if you change your mind regarding the flu shot.  Let us know if you need anything.

## 2019-02-12 NOTE — Progress Notes (Signed)
Chief Complaint  Patient presents with  . Annual Exam    Well Male Howard Fisher is here for a complete physical.   His last physical was >1 year ago.  Current diet: in general, a "real good" diet.   Current exercise: walking, golfing Weight trend: stable Daytime fatigue? No. Seat belt? Yes.    Past Medical History:  Diagnosis Date  . Allergic rhinitis   . 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 Diabetes mellitus without complication ([place])   . GERD (gastroesophageal reflux disease)   . Hiatal hernia   . History of stomach ulcers    1985  . Hypercholesteremia   . Hypertension   . Melanoma ([place]) 2014   shoulder  . Parkinson disease ([place])   . Peyronie's disease      Past Surgical History:  Procedure Laterality Date  . basal cell removed from face  12/2012  . CATARACT EXTRACTION Bilateral 11/2009   Dr. Charise Killian  . CHOLECYSTECTOMY  02/1993   Dr. Harlow Asa  . right hernia repair  2002   Dr. Deon Pilling  . skin cancer removed from right shoulder  11/2013   melanoma    Medications  Current Outpatient Medications on File Prior to Visit  Medication Sig Dispense Refill  . aspirin 81 MG tablet Take 81 mg by mouth daily.     . Blood Glucose Monitoring Suppl (ONE TOUCH ULTRA 2) w/Device KIT Use once daily to check blood sugar.  DX E11.9 1 each 0  . carbidopa-levodopa (SINEMET IR) 25-100 MG tablet Take 1 tablet by mouth 3 (three) times daily. 270 tablet 4  . glucose blood (ONE TOUCH ULTRA TEST) test strip Use once daily to check blood sugar.  DXE11.9 50 each 6  . lisinopril-hydrochlorothiazide (PRINZIDE,ZESTORETIC) 20-12.5 MG tablet Take 1 tablet by mouth daily. 90 tablet 3  . omeprazole (PRILOSEC) 20 MG capsule Take 1 capsule (20 mg total) by mouth daily. 90 capsule 3  . ONETOUCH DELICA LANCETS 15Q MISC Use once daily to check blood sugar.  DX E11.9 100 each 6  . rasagiline (AZILECT) 1 MG TABS tablet Take 1 tablet (1 mg total) by mouth daily. 100 tablet 11  . simvastatin (ZOCOR) 40 MG  tablet Take 1 tablet (40 mg total) by mouth at bedtime. 90 tablet 3   Allergies Allergies  Allergen Reactions  . Penicillins     REACTION: rash and itching    Family History Family History  Problem Relation Age of Onset  . Diabetes Mother   . Dementia Mother   . Dementia Father   . Diabetes Sister   . Healthy Son        x2  . Healthy Daughter        x1  . Colon cancer Neg Hx     Review of Systems: Constitutional:  no fevers or chills Eye:  no recent significant change in vision Ear/Nose/Mouth/Throat:  Ears:  no recent hearing loss Nose/Mouth/Throat:  no complaints of nasal congestion or sore throat Cardiovascular:  no chest pain, no palpitations Respiratory:  no cough and no shortness of breath Gastrointestinal:  no abdominal pain, no change in bowel habits GU:  Male: negative for dysuria, frequency, and incontinence and negative for prostate symptoms Musculoskeletal/Extremities:  no pain, redness, or swelling of the joints Integumentary (Skin):  no abnormal skin lesions reported Neurologic:  no headaches, Endocrine:  No unexpected weight changes Hematologic/Lymphatic:  no areas of easy bruising  Exam BP 138/86 (BP Location: Left Arm, Patient  Position: Sitting, Cuff Size: Normal)   Pulse 85   Temp 98.1 F (36.7 C) (Oral)   Ht _0  (1.854 m)   Wt 213 lb 8 oz (96.8 kg)   SpO2 94%   BMI 28.17 kg/m  General:  well developed, well nourished, in no apparent distress Skin:  no significant moles, warts, or growths Head:  no masses, lesions, or tenderness Eyes:  pupils equal and round, sclera anicteric without injection Ears:  canals without lesions, TMs shiny without retraction, no obvious effusion, no erythema Nose:  nares patent, septum midline, mucosa normal Throat/Pharynx:  lips and gingiva without lesion; tongue and uvula midline; non-inflamed pharynx; no exudates or postnasal drainage Neck: neck supple without adenopathy, thyromegaly, or masses Lungs:  clear to  auscultation, breath sounds equal bilaterally, no respiratory distress Cardio:  regular rate and rhythm, no LE edema or bruits Abdomen:  abdomen soft, nontender; bowel sounds normal; no masses or organomegaly Genital (male): circumcised penis, no lesions or discharge; testes present bilaterally without masses or tenderness Rectal: Deferred Musculoskeletal:  symmetrical muscle groups noted without atrophy or deformity Extremities:  no clubbing, cyanosis, or edema, no deformities, no skin discoloration Neuro:  gait normal; deep tendon reflexes normal and symmetric, sensation intact to pinprick on b/l feet Psych: well oriented with normal range of affect and appropriate judgment/insight  Assessment and Plan  Well adult exam - Plan: Lipid panel, Comprehensive metabolic panel  Controlled type 2 diabetes mellitus without complication, without long-term current use of insulin (HCC) - Plan: Microalbumin / creatinine urine ratio, Hemoglobin A1c, HM DIABETES FOOT EXAM   Well 78 y.o. male. Counseled on diet and exercise. Other orders as above. Call eye doctor.  Follow up in 6 mo.  The patient voiced understanding and agreement to the plan.  [place], DO 02/12/19 11:13 AM

## 2019-02-20 ENCOUNTER — Telehealth: Payer: Self-pay | Admitting: *Deleted

## 2019-02-20 NOTE — Telephone Encounter (Signed)
Copied from [place] [id number]. Topic: General - Inquiry >> Feb 20, 2019  2:55 PM Selinda Flavin B, NT wrote: Reason for CRM: Patient calling and would like to know if a print out of his 02/12/2019 lab results could be mailed to him? Please advise.

## 2019-02-24 NOTE — Telephone Encounter (Signed)
Mailed results as requested.

## 2019-06-04 ENCOUNTER — Telehealth: Payer: Self-pay | Admitting: Neurology

## 2019-06-04 NOTE — Telephone Encounter (Signed)
Pt called and stated that his rasagiline (AZILECT) 1 MG TABS tablet got lost when it was ordered and then they reordered it again and it still has not reached him. Pt is worried he will not get it on time and he has about 3 weeks left of medication. Please advise.

## 2019-06-04 NOTE — Telephone Encounter (Addendum)
He gets his medication from a pharmacy in [place] ([place] [facility]).  We are happy to send in a 30-day supply to a local pharmacy.  We have [facility] on [address] in [facility] listed for him.  I left him a message asking him to call us back and let us know if he would like a prescription for Azilect to be sent to [facility].

## 2019-06-05 NOTE — Telephone Encounter (Signed)
Pt returned call and stated that he is going to hold off because its cheaper through the [place] pharmacy

## 2019-06-11 ENCOUNTER — Encounter: Payer: Self-pay | Admitting: Internal Medicine

## 2019-06-11 ENCOUNTER — Encounter: Payer: Self-pay | Admitting: Family Medicine

## 2019-06-11 ENCOUNTER — Other Ambulatory Visit: Payer: Self-pay

## 2019-06-11 ENCOUNTER — Ambulatory Visit (INDEPENDENT_AMBULATORY_CARE_PROVIDER_SITE_OTHER): Payer: Medicare HMO | Admitting: Family Medicine

## 2019-06-11 VITALS — BP 108/68 | HR 94 | Temp 98.4°F | Ht 73.0 in | Wt 208.0 lb

## 2019-06-11 DIAGNOSIS — M25512 Pain in left shoulder: Secondary | ICD-10-CM

## 2019-06-11 NOTE — Progress Notes (Signed)
Musculoskeletal Exam  Patient: Howard Fisher DOB: [birth year]  DOS: 06/11/2019  SUBJECTIVE:  Chief Complaint:   Chief Complaint  Patient presents with  . Shoulder Pain    left    Howard Fisher is a 78 y.o.  male for evaluation and treatment of L shoulder pain.   Onset:  2 weeks ago. No inj or change in activity.  Location: upper shoulder area Character:  dull  Progression of issue:  is unchanged Associated symptoms: certain positions make it worse, yellowing/bruising Treatment: to date has been acetaminophen.   Neurovascular symptoms: no  ROS: Musculoskeletal/Extremities: +shoulder pain  Past Medical History:  Diagnosis Date  . Allergic rhinitis   . 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 Diabetes mellitus without complication ([place])   . GERD (gastroesophageal reflux disease)   . Hiatal hernia   . History of stomach ulcers    1985  . Hypercholesteremia   . Hypertension   . Melanoma ([place]) 2014   shoulder  . Parkinson disease ([place])   . Peyronie's disease     Objective: VITAL SIGNS: BP 108/68 (BP Location: Left Arm, Patient Position: Sitting, Cuff Size: Normal)   Pulse 94   Temp 98.4 F (36.9 C) (Oral)   Ht 6\' 1"  (1.854 m)   Wt 208 lb (94.3 kg)   SpO2 96%   BMI 27.44 kg/m  Constitutional: Well formed, well developed. No acute distress. Cardiovascular: Brisk cap refill Thorax & Lungs: No accessory muscle use Musculoskeletal: L shoulder.   Normal active range of motion: yes.   Normal passive range of motion: yes Tenderness to palpation: no Deformity: no Ecchymosis: no Tests positive: Empty can Tests negative: Neer's, hawkins, speeds, lift off, cross over Neurologic: Normal sensory function. No focal deficits noted. DTR's equal and symmetric in UE's. No clonus. Psychiatric: Normal mood. Age appropriate judgment and insight. Alert & oriented x 3.    Assessment:  Acute on chronic pain of left shoulder - Plan: Tylenol, consider Krill oil, glucosamine,  tumeric. Offered injection, not bothering enough at this time.   Plan: F/u in 1 mo to reck. The patient voiced understanding and agreement to the plan.   [place], DO 06/11/19  12:51 PM

## 2019-06-11 NOTE — Patient Instructions (Signed)
OK to take Tylenol 1000 mg (2 extra strength tabs) or 975 mg (3 regular strength tabs) every 6 hours as needed.  Consider Krill oil, tumeric, and glucosamine. I think this is worth trying before looking into prescription options.   Heat (pad or rice pillow in microwave) over affected area, 10-15 minutes twice daily.   Ice/cold pack over area for 10-15 min twice daily.  Do the stretches/exercises for the shoulder.  Let us know if you need anything.

## 2019-06-16 IMAGING — NM NM DATSCAN
3 series · 18 of 18 positions shown · non-contrast
Comparison: MRI 11/10/2016

CLINICAL DATA: RIGHT arm tremor for 7 months. Progressive findings.

EXAM:
NUCLEAR MEDICINE BRAIN IMAGING WITH SPECT  (DaTscan )
TECHNIQUE: SPECT images of the brain were obtained after intravenous injection
of radiopharmaceutical. 4 hour post injection imaging. Appropriate
positioning. 0.8 ml lugols solution administered in a.m
RADIOPHARMACEUTICALS:  4.8 millicuries I 123 Ioflupane

[Series 1: spect - (id) _(id)_cor · 4.1mm · 4.14mm/px · 6 of 128 frames shown]
[frame 11/128]
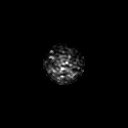
[frame 32/128]
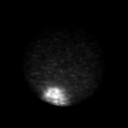
[frame 54/128]
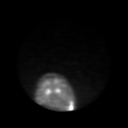
[frame 75/128]
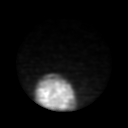
[frame 96/128]
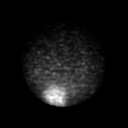
[frame 118/128]
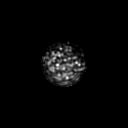

[Series 1: spect - (id) _(id)_tra · 4.1mm · 4.14mm/px · 6 of 128 frames shown]
[frame 11/128]
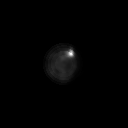
[frame 32/128]
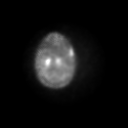
[frame 54/128]
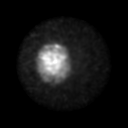
[frame 75/128]
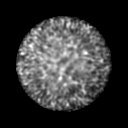
[frame 96/128]
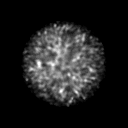
[frame 118/128]
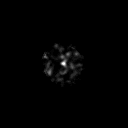

[Series 1: brain spect · 4.14mm/px · 6 of 120 frames shown]
[frame 11/120  full-range]
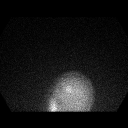
[frame 31/120  full-range]
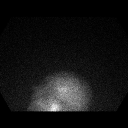
[frame 51/120  full-range]
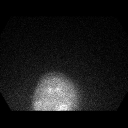
[frame 71/120  full-range]
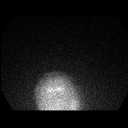
[frame 91/120  full-range]
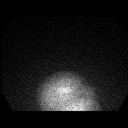
[frame 111/120  full-range]
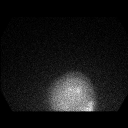

[18 of 18 positions shown; findings below may reference images not displayed]

FINDINGS: Overall decreased radiotracer accumulation within LEFT and RIGHT
striata. Marked decreased activity in the LEFT putamen and decreased
activity in the LEFT head of the caudate compared to the RIGHT. Mild
decreased activity in the RIGHT putamen and caudate.
IMPRESSION: Decreased radiotracer activity within the bilateral striata with
greater deficit on the LEFT. Findings consistent with a Parkinson's
syndrome pattern .

## 2019-07-09 ENCOUNTER — Ambulatory Visit (INDEPENDENT_AMBULATORY_CARE_PROVIDER_SITE_OTHER): Payer: Medicare HMO | Admitting: Family Medicine

## 2019-07-09 ENCOUNTER — Other Ambulatory Visit: Payer: Self-pay

## 2019-07-09 ENCOUNTER — Encounter: Payer: Self-pay | Admitting: Family Medicine

## 2019-07-09 VITALS — BP 132/80 | HR 90 | Temp 98.2°F | Ht 73.0 in | Wt 209.0 lb

## 2019-07-09 DIAGNOSIS — M25512 Pain in left shoulder: Secondary | ICD-10-CM | POA: Diagnosis not present

## 2019-07-09 MED ORDER — METHYLPREDNISOLONE ACETATE 40 MG/ML IJ SUSP
40.0000 mg | Freq: Once | INTRAMUSCULAR | Status: AC
  Administered 2019-07-09: 40 mg via INTRA_ARTICULAR

## 2019-07-09 NOTE — Progress Notes (Signed)
Chief Complaint  Patient presents with  . Follow-up    Subjective: Patient is a 78 y.o. male here for f/u L shoulder pain.  Seen 1 mo ago, rec'd Krill oil and Tylenol, glucosamine and turmeric. Reports little since that time. Offered injection then, but it was not bothering hi enough. He does have stretches/exercises and as been doing them. Has been on turmeric for past 4 d without much relief. Interested in injection today.    ROS: MSK: +L shoulder pain  Past Medical History:  Diagnosis Date  . Allergic rhinitis   . 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 Diabetes mellitus without complication ([place])   . GERD (gastroesophageal reflux disease)   . Hiatal hernia   . History of stomach ulcers    1985  . Hypercholesteremia   . Hypertension   . Melanoma ([place]) 2014   shoulder  . Parkinson disease ([place])   . Peyronie's disease     Objective: BP 132/80 (BP Location: Left Arm, Patient Position: Sitting, Cuff Size: Normal)   Pulse 90   Temp 98.2 F (36.8 C) (Oral)   Ht 6\' 1"  (1.854 m)   Wt 209 lb (94.8 kg)   SpO2 94%   BMI 27.57 kg/m  General: Awake, appears stated age MSK: Decreased abduction in L shoulder, +empty can, neers, neg cross over, speeds Lungs: No accessory muscle use Neuro: DTR's eq and symmetric in UE's b/l Psych: Age appropriate judgment and insight, normal affect and mood  Procedure Note; Shoulder bursa injection Verbal consent obtained. The area was palpated, an area was marked just caudal to the acromion process laterally, and cleaned with alcohol x1. A 27-gauge needle was used to enter the joint laterally with ease. 40 mg of Depomedrol with 2 mL of 1% lidocaine was injected. The patient tolerated the procedure well. There were no complications noted.  Assessment and Plan: Acute pain of left shoulder - Plan: methylPREDNISolone acetate (DEPO-MEDROL) injection 40 mg, PR DRAIN/INJECT LARGE JOINT/BURSA, Cont stretches/exercises, Turmeric, heat, Tylenol. If  no improvement, will consider referral to sports med or PT.  Orders as above. The patient voiced understanding and agreement to the plan.  [place], DO 07/09/19  12:07 PM

## 2019-07-09 NOTE — Patient Instructions (Signed)
Continue the stretches/exercises, heat, ice.  The shot may take up to 8 days to finally work so be patient. If it doesn't work after that, it has likely failed. I would need to know if this situation arises.  OK to take Tylenol 1000 mg (2 extra strength tabs) or 975 mg (3 regular strength tabs) every 6 hours as needed.  Let us know if you need anything.

## 2019-07-22 ENCOUNTER — Ambulatory Visit: Payer: Medicare HMO | Admitting: Neurology

## 2019-07-22 ENCOUNTER — Encounter: Payer: Self-pay | Admitting: Neurology

## 2019-07-22 ENCOUNTER — Other Ambulatory Visit: Payer: Self-pay

## 2019-07-22 VITALS — BP 129/80 | HR 72 | Temp 98.6°F | Ht 73.0 in | Wt 212.5 lb

## 2019-07-22 DIAGNOSIS — G20 Parkinson's disease: Secondary | ICD-10-CM | POA: Diagnosis not present

## 2019-07-22 MED ORDER — CARBIDOPA-LEVODOPA 25-100 MG PO TABS: 1.0000 | ORAL_TABLET | Freq: Three times a day (TID) | ORAL | 4 refills | Status: DC

## 2019-07-22 MED ORDER — RASAGILINE MESYLATE 1 MG PO TABS: 1.0000 mg | ORAL_TABLET | Freq: Every day | ORAL | 4 refills | Status: DC

## 2019-07-22 NOTE — Progress Notes (Signed)
[facility]  PATIENT: Howard Fisher DOB: Mar 26, 1941  HISTORY OF PRESENT ILLNESS: Howard Fisher is a 78 year old male, seen in refer by his primary care doctor Shelda Pal, for evaluation of tremor, involving his right arm and right hand, initial evaluation was on October 09 2017.  I have reviewed and summarized referring note, he has history of hypertension, diabetes, hyperlipidemia,  Since May of 2018, he noticed gradual onset right hand tremor, most noticeable when he is resting his right arm, he denies significant weakness, no left hand involvement,  He had a history of right shoulder injury in 2016 after he fell and landed on his right shoulder.  He denies loss sense of smell, he denies REM sleep disorder, no dizziness, chronic constipation, no family history of Parkinson's disease, he denies gait abnormality.   MRI of the brain November 2017 at the [facility] showed atrophy and small vessel disease no acute abnormality.  Laboratory evaluation in August 2018, normal magnesium, CBC, lipid profile, A1c was 6.1, normal TSH,  Update January 15, 2018. 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  He started  to take Azilect 1 mg daily since October, getting prescription from [place], he denies significant sore, no significant improvement  UPDATE July 15 2018:YY He is accompanied by his wife at today's clinical visit, he is taking Sinemet 25/100 mg 3 times a day at 9, 2, 8 PM, no significant side effect noticed, there was no wearing off noticed, he has mild limitation mainly due to pain in his shoulders, no significant gait abnormality, He has a lot of stress due to recent move, has been moody  UPDATE July 22, 2019, He is with his wife, his Parkinson's disease is overall under good control, getting  Azilect 1 mg daily from taking Sinemet 25/100 mg 3 times a day he ambulated without difficulty   REVIEW OF SYSTEMS: Full 14 system review of systems performed and notable only for those listed, all others are neg:  As above  ALLERGIES: Allergies  Allergen Reactions  . Penicillins     REACTION: rash and itching    HOME MEDICATIONS: Outpatient Medications Prior to Visit  Medication Sig Dispense Refill  . aspirin 81 MG tablet Take 81 mg by mouth daily.     . Blood Glucose Monitoring Suppl (ONE TOUCH ULTRA 2) w/Device KIT Use once daily to check blood sugar.  DX E11.9 1 each 0  . carbidopa-levodopa (SINEMET IR) 25-100 MG tablet Take 1 tablet by mouth 3 (three) times daily. 270 tablet 4  . glucose blood (ONE TOUCH ULTRA TEST) test strip Use once daily to check blood sugar.  DXE11.9 50 each 6  . lisinopril-hydrochlorothiazide (PRINZIDE,ZESTORETIC) 20-12.5 MG tablet Take 1 tablet by mouth daily. 90 tablet 3  . omeprazole (PRILOSEC) 20 MG capsule Take 1 capsule (20 mg total) by mouth daily. 90 capsule 3  . ONETOUCH DELICA LANCETS 78M MISC Use once daily to check blood sugar.  DX E11.9 100 each 6  . rasagiline (AZILECT) 1 MG TABS tablet Take 1 tablet (1 mg total) by mouth daily. 100 tablet 11  . simvastatin (ZOCOR) 40 MG tablet Take 1 tablet (40 mg total) by mouth at bedtime. 90 tablet 3  . TURMERIC CURCUMIN PO Take 1 tablet by mouth daily.     No facility-administered medications prior to visit.     PAST  MEDICAL HISTORY: Past Medical History:  Diagnosis Date  . Allergic rhinitis   . 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 Diabetes mellitus without complication ([place])   . GERD (gastroesophageal reflux disease)   . Hiatal hernia   . History of stomach ulcers    1985  . Hypercholesteremia   . Hypertension   . Melanoma ([place]) 2014   shoulder  . Parkinson disease ([place])   . Peyronie's disease     PAST SURGICAL HISTORY: Past Surgical History:  Procedure Laterality Date  . basal  cell removed from face  12/2012  . CATARACT EXTRACTION Bilateral 11/2009   Dr. Charise Killian  . CHOLECYSTECTOMY  02/1993   Dr. Harlow Asa  . right hernia repair  2002   Dr. Deon Pilling  . skin cancer removed from right shoulder  11/2013   melanoma    FAMILY HISTORY: Family History  Problem Relation Age of Onset  . Diabetes Mother   . Dementia Mother   . Dementia Father   . Diabetes Sister   . Healthy Son        x2  . Healthy Daughter        x1  . Colon cancer Neg Hx     SOCIAL HISTORY: Social History   Socioeconomic History  . Marital status: Married    Spouse name: [name]  . Number of children: 3  . Years of education: One year college  . Highest education level: Not on file  Occupational History  . Occupation: retired Investment banker, operational  . Financial resource strain: Not on file  . Food insecurity    Worry: Not on file    Inability: Not on file  . Transportation needs    Medical: Not on file    Non-medical: Not on file  Tobacco Use  . Smoking status: Never Smoker  . Smokeless tobacco: Never Used  Substance and Sexual Activity  . Alcohol use: Yes    Alcohol/week: 1.0 standard drinks    Types: 1 Glasses of wine per week    Comment: 3 drinks per week  . Drug use: No  . Sexual activity: Not on file  Lifestyle  . Physical activity    Days per week: Not on file    Minutes per session: Not on file  . Stress: Not on file  Relationships  . Social Herbalist on phone: Not on file    Gets together: Not on file    Attends religious service: Not on file    Active member of club or organization: Not on file    Attends meetings of clubs or organizations: Not on file    Relationship status: Not on file  . Intimate partner violence    Fear of current or ex partner: Not on file    Emotionally abused: Not on file    Physically abused: Not on file    Forced sexual activity: Not on file  Other Topics Concern  . Not on file  Social History Narrative   Lives at home  with his wife.   Right-handed.   2 cups coffee in the morning.     PHYSICAL EXAM  Vitals:   07/22/19 1047  BP: 129/80  Pulse: 72  Temp: 98.6 F (37 C)  Weight: 212 lb 8 oz (96.4 kg)  Height: '6\' 1"'  (1.854 m)   Body mass index is 28.04 kg/m.  Generalized: Well developed, in no acute distress , well-groomed Head: normocephalic and atraumatic,.  Oropharynx benign negative Myerson sign Neck: Supple,  Musculoskeletal: No deformity   Neurological examination   Mentation: Alert oriented to time, place, history taking. Attention span and concentration appropriate. Recent and remote memory intact.  Follows all commands speech and language fluent.   Cranial nerve II-XII: Pupils were equal round reactive to light extraocular movements were full, visual field were full on confrontational test. Facial sensation and strength were normal. hearing was intact to finger rubbing bilaterally. Uvula tongue midline. head turning and shoulder shrug were normal and symmetric.Tongue protrusion into cheek strength was normal. Motor: normal bulk and tone, full strength in the BUE, BLE, no bradykinesia no cogwheeling Sensory: normal and symmetric to light touch, pinprick, and  Vibration, in the upper and lower extremities  Coordination: finger-nose-finger, heel-to-shin bilaterally, no dysmetria Reflexes: Brachioradialis 2/2, biceps 2/2, triceps 2/2, patellar 2/2, Achilles 2/2, plantar responses were flexor bilaterally. Gait and Station: Rising up from seated position without assistance, normal stance,  moderate stride, good bilateral arm swing, smooth turning, no assistive device DIAGNOSTIC DATA (LABS, IMAGING, TESTING) - I reviewed patient records, labs, notes, testing and imaging myself where available.  Lab Results  Component Value Date   WBC 6.9 02/07/2018   HGB 15.9 02/07/2018   HCT 45.4 02/07/2018   MCV 94.2 02/07/2018   PLT 204.0 02/07/2018      Component Value Date/Time   NA 140 02/12/2019  1059   K 3.8 02/12/2019 1059   CL 100 02/12/2019 1059   CO2 30 02/12/2019 1059   GLUCOSE 111 (H) 02/12/2019 1059   BUN 12 02/12/2019 1059   CREATININE 1.04 02/12/2019 1059   CREATININE 1.03 04/17/2014 1026   CALCIUM 9.4 02/12/2019 1059   PROT 7.1 02/12/2019 1059   ALBUMIN 4.6 02/12/2019 1059   AST 28 02/12/2019 1059   ALT 6 02/12/2019 1059   ALKPHOS 57 02/12/2019 1059   BILITOT 0.7 02/12/2019 1059   GFRNONAA 70.39 12/30/2009 1014   GFRAA 86 12/17/2008 0946   Lab Results  Component Value Date   CHOL 148 02/12/2019   HDL 44.30 02/12/2019   LDLCALC 78 02/12/2019   LDLDIRECT 94.6 01/15/2014   TRIG 130.0 02/12/2019   CHOLHDL 3 02/12/2019   Lab Results  Component Value Date   HGBA1C 6.3 02/12/2019   No results found for: VITAMINB12 Lab Results  Component Value Date   TSH 4.36 09/27/2016      ASSESSMENT AND PLAN  Idiopathic Parkinson's disease  Keep Azilect 1 mg daily  Add on Sinemet 25/100 mg 3 times a day  Keep moderate exercise.  Marcial Pacas, M.D. Ph.D.  [facility] [place], [place] [address] Phone: [phone] Fax:      [phone]

## 2019-07-29 DIAGNOSIS — Z7984 Long term (current) use of oral hypoglycemic drugs: Secondary | ICD-10-CM | POA: Diagnosis not present

## 2019-07-29 DIAGNOSIS — H524 Presbyopia: Secondary | ICD-10-CM | POA: Diagnosis not present

## 2019-07-29 DIAGNOSIS — E119 Type 2 diabetes mellitus without complications: Secondary | ICD-10-CM | POA: Diagnosis not present

## 2019-07-29 DIAGNOSIS — H52203 Unspecified astigmatism, bilateral: Secondary | ICD-10-CM | POA: Diagnosis not present

## 2019-07-29 DIAGNOSIS — H02135 Senile ectropion of left lower eyelid: Secondary | ICD-10-CM | POA: Diagnosis not present

## 2019-07-29 DIAGNOSIS — H43813 Vitreous degeneration, bilateral: Secondary | ICD-10-CM | POA: Diagnosis not present

## 2019-07-29 DIAGNOSIS — H26491 Other secondary cataract, right eye: Secondary | ICD-10-CM | POA: Diagnosis not present

## 2019-07-29 DIAGNOSIS — H02132 Senile ectropion of right lower eyelid: Secondary | ICD-10-CM | POA: Diagnosis not present

## 2019-07-29 DIAGNOSIS — H16223 Keratoconjunctivitis sicca, not specified as Sjogren's, bilateral: Secondary | ICD-10-CM | POA: Diagnosis not present

## 2019-07-29 DIAGNOSIS — H5201 Hypermetropia, right eye: Secondary | ICD-10-CM | POA: Diagnosis not present

## 2019-08-15 DIAGNOSIS — R69 Illness, unspecified: Secondary | ICD-10-CM | POA: Diagnosis not present

## 2019-08-18 DIAGNOSIS — H26491 Other secondary cataract, right eye: Secondary | ICD-10-CM | POA: Diagnosis not present

## 2019-11-10 ENCOUNTER — Telehealth: Payer: Self-pay | Admitting: Neurology

## 2019-11-10 MED ORDER — RASAGILINE MESYLATE 1 MG PO TABS: 1.0000 mg | ORAL_TABLET | Freq: Every day | ORAL | 3 refills | Status: DC

## 2019-11-10 NOTE — Telephone Encounter (Signed)
1) Medication(s) Requested (by name): rasagiline 2) Pharmacy of Choice:   3) Special Requests:  Patient called wanting to know if his rasagiline prescription could be sent to [facility] for coverage. Patient states his daughter in law also has parkinsons and the same insurance as the patient and they were able to get coverage for their medication when provider called it in. Patient states they are coming to the point when they can no longer afford to keep purchasing the medications and would like to see if this would be possible. Please follow up.

## 2019-11-10 NOTE — Telephone Encounter (Signed)
He has pharmacy benefits through [place].  I initiated a PA through covermymeds and was informed the medication did not require it.  I called the patient back and he asked me to speak to his wife, Geovonnie Koonz (on [place]).  She is concerned about the cost.  We will allow [facility] to process through his insurance plan to get the co-pay cost.  From there, it may need a tier exception.  His wife would like a call back on her [phone].

## 2019-11-11 NOTE — Telephone Encounter (Signed)
I spoke to [facility] this morning [phone]).  They reported the co-pay for a 90-day supply of rasagiline 1mg  will be $140.00.  The patient is able to get 100 tablets from [place] for $100.  The cost has become a financial burden.  I have initiated a tier exception request through his insurance plan ([employer] [phone] - ID#[id number]) - submitted through covermymeds.  The decision can take up to five business days.  The only other lower cost formulary alternative to rasagiline is selegiline 5mg  with a co-pay of $5 for a 30-day supply.  I have returned the call to his wife to provide her with this update.  We will notify her once the tier exception outcome has been determined.

## 2019-11-12 NOTE — Telephone Encounter (Signed)
We received an approval letter from [facility] for the tier exception (rasagiline).  I called [facility] and the copay is $0 for a 90-day supply.  This is valid through 12/25/2019.  I'm not sure which tier the medication will be in for 2021.  I have spoken with the patient's wife to provide her with this update.  If they start seeing a high co-pay again, she will call me.  The request may have to be renewed each plan year.  She verbalized understanding.

## 2019-12-09 ENCOUNTER — Other Ambulatory Visit: Payer: Self-pay | Admitting: Family Medicine

## 2020-01-08 DIAGNOSIS — R69 Illness, unspecified: Secondary | ICD-10-CM | POA: Diagnosis not present

## 2020-02-02 ENCOUNTER — Other Ambulatory Visit: Payer: Self-pay | Admitting: Family Medicine

## 2020-02-23 ENCOUNTER — Other Ambulatory Visit: Payer: Self-pay

## 2020-02-24 ENCOUNTER — Ambulatory Visit (INDEPENDENT_AMBULATORY_CARE_PROVIDER_SITE_OTHER): Payer: Medicare HMO | Admitting: Family Medicine

## 2020-02-24 ENCOUNTER — Other Ambulatory Visit: Payer: Self-pay

## 2020-02-24 ENCOUNTER — Encounter: Payer: Self-pay | Admitting: Family Medicine

## 2020-02-24 VITALS — BP 122/80 | HR 86 | Temp 95.8°F | Ht 73.0 in | Wt 214.5 lb

## 2020-02-24 DIAGNOSIS — E1165 Type 2 diabetes mellitus with hyperglycemia: Secondary | ICD-10-CM | POA: Diagnosis not present

## 2020-02-24 DIAGNOSIS — Z0000 Encounter for general adult medical examination without abnormal findings: Secondary | ICD-10-CM

## 2020-02-24 LAB — COMPREHENSIVE METABOLIC PANEL
ALT: 11 U/L (ref 0–53)
AST: 23 U/L (ref 0–37)
Albumin: 4.4 g/dL (ref 3.5–5.2)
Alkaline Phosphatase: 59 U/L (ref 39–117)
BUN: 14 mg/dL (ref 6–23)
CO2: 28 mEq/L (ref 19–32)
Calcium: 9.8 mg/dL (ref 8.4–10.5)
Chloride: 101 mEq/L (ref 96–112)
Creatinine, Ser: 1.03 mg/dL (ref 0.40–1.50)
GFR: 69.51 mL/min (ref >60.00)
Glucose, Bld: 139 mg/dL — ABNORMAL HIGH (ref 70–99)
Potassium: 4 mEq/L (ref 3.5–5.1)
Sodium: 139 mEq/L (ref 135–145)
Total Bilirubin: 0.7 mg/dL (ref 0.2–1.2)
Total Protein: 7.1 g/dL (ref 6.0–8.3)

## 2020-02-24 LAB — LIPID PANEL
Cholesterol: 149 mg/dL (ref 0–200)
HDL: 39.4 mg/dL (ref >39.00)
LDL Cholesterol: 81 mg/dL (ref 0–99)
NonHDL: 109.95
Total CHOL/HDL Ratio: 4
Triglycerides: 144 mg/dL (ref 0.0–149.0)
VLDL: 28.8 mg/dL (ref 0.0–40.0)

## 2020-02-24 LAB — MICROALBUMIN / CREATININE URINE RATIO
Creatinine,U: 199.7 mg/dL
Microalb Creat Ratio: 0.5 mg/g (ref 0.0–30.0)
Microalb, Ur: 0.9 mg/dL (ref 0.0–1.9)

## 2020-02-24 LAB — HEMOGLOBIN A1C: Hgb A1c MFr Bld: 6.6 % — ABNORMAL HIGH (ref 4.6–6.5)

## 2020-02-24 MED ORDER — LISINOPRIL-HYDROCHLOROTHIAZIDE 20-12.5 MG PO TABS: 1.0000 | ORAL_TABLET | Freq: Every day | ORAL | 3 refills | Status: DC

## 2020-02-24 NOTE — Progress Notes (Signed)
CC: Well male  Well Male Howard Fisher is here for a complete physical.   His last physical was >1 year ago.  Current diet: in general, diet is fair.   Current exercise: walking, golf Weight trend: stable Daytime fatigue? No. Seat belt? Yes.    Health maintenance Shingrix- No Tetanus- Yes Pneumonia vaccine- Yes  Past Medical History:  Diagnosis Date  . Allergic rhinitis   . 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 Diabetes mellitus without complication ([place])   . GERD (gastroesophageal reflux disease)   . Hiatal hernia   . History of stomach ulcers    1985  . Hypercholesteremia   . Hypertension   . Melanoma ([place]) 2014   shoulder  . Parkinson disease ([place])   . Peyronie's disease      Past Surgical History:  Procedure Laterality Date  . basal cell removed from face  12/2012  . CATARACT EXTRACTION Bilateral 11/2009   Dr. Charise Killian  . CHOLECYSTECTOMY  02/1993   Dr. Harlow Asa  . right hernia repair  2002   Dr. Deon Pilling  . skin cancer removed from right shoulder  11/2013   melanoma    Medications  Current Outpatient Medications on File Prior to Visit  Medication Sig Dispense Refill  . aspirin 81 MG tablet Take 81 mg by mouth daily.     . Blood Glucose Monitoring Suppl (ONE TOUCH ULTRA 2) w/Device KIT Use once daily to check blood sugar.  DX E11.9 1 each 0  . carbidopa-levodopa (SINEMET IR) 25-100 MG tablet Take 1 tablet by mouth 3 (three) times daily. 270 tablet 4  . glucose blood (ONE TOUCH ULTRA TEST) test strip Use once daily to check blood sugar.  DXE11.9 50 each 6  . omeprazole (PRILOSEC) 20 MG capsule TAKE 1 CAPSULE DAILY 90 capsule 3  . ONETOUCH DELICA LANCETS 36R MISC Use once daily to check blood sugar.  DX E11.9 100 each 6  . rasagiline (AZILECT) 1 MG TABS tablet Take 1 tablet (1 mg total) by mouth daily. 90 tablet 3  . simvastatin (ZOCOR) 40 MG tablet TAKE 1 TABLET AT BEDTIME 90 tablet 3  . TURMERIC CURCUMIN PO Take 1 tablet by mouth daily.       Allergies Allergies  Allergen Reactions  . Penicillins     REACTION: rash and itching    Family History Family History  Problem Relation Age of Onset  . Diabetes Mother   . Dementia Mother   . Dementia Father   . Diabetes Sister   . Healthy Son        x2  . Healthy Daughter        x1  . Colon cancer Neg Hx     Review of Systems: Constitutional:  no fevers or chills Eye:  no recent significant change in vision Ear/Nose/Mouth/Throat:  Ears:  no recent hearing loss Nose/Mouth/Throat:  no complaints of nasal congestion or sore throat Cardiovascular:  no chest pain Respiratory:  no shortness of breath Gastrointestinal:  no abdominal pain, no change in bowel habits GU:  Male: negative for dysuria, frequency, and incontinence  Musculoskeletal/Extremities:  no pain of the joints Integumentary (Skin):  no abnormal skin lesions reported Neurologic:  no headaches, Endocrine:  No unexpected weight changes Hematologic/Lymphatic:  no areas of easy bruising  Exam BP 122/80 (BP Location: Left Arm, Patient Position: Sitting, Cuff Size: Normal)   Pulse 86   Temp (!) 95.8 F (35.4 C) (Temporal)   Ht '6\' 1"'  (1.854  m)   Wt 214 lb 8 oz (97.3 kg)   SpO2 94%   BMI 28.30 kg/m  General:  well developed, well nourished, in no apparent distress Skin:  no significant moles, warts, or growths Head:  no masses, lesions, or tenderness Eyes:  pupils equal and round, sclera anicteric without injection Ears:  canals without lesions, TMs shiny without retraction, no obvious effusion, no erythema Nose:  nares patent, septum midline, mucosa normal Throat/Pharynx:  lips and gingiva without lesion; tongue and uvula midline; non-inflamed pharynx; no exudates or postnasal drainage Neck: neck supple without adenopathy, thyromegaly, or masses Lungs:  clear to auscultation, breath sounds equal bilaterally, no respiratory distress Cardio:  regular rate and rhythm, no LE edema or bruits Rectal:  Deferred Musculoskeletal:  symmetrical muscle groups noted without atrophy or deformity Extremities:  no clubbing, cyanosis, or edema, no deformities, no skin discoloration Neuro:  gait normal; deep tendon reflexes normal and symmetric; sensation intact to pinprick b/l on feet; no tremor noted today Psych: well oriented with normal range of affect and appropriate judgment/insight  Assessment and Plan  Well adult exam - Plan: Comprehensive metabolic panel, Lipid panel  Type 2 diabetes mellitus with hyperglycemia, without long-term current use of insulin (HCC) - Plan: Hemoglobin A1c, Microalbumin / creatinine urine ratio, HM DIABETES FOOT EXAM   Well 79 y.o. male. Counseled on diet and exercise. Other orders as above. Follow up in 6 mo or prn.  The patient voiced understanding and agreement to the plan.  [place], DO 02/24/20 9:12 AM

## 2020-02-24 NOTE — Patient Instructions (Signed)

## 2020-03-08 DIAGNOSIS — D0461 Carcinoma in situ of skin of right upper limb, including shoulder: Secondary | ICD-10-CM | POA: Diagnosis not present

## 2020-03-09 ENCOUNTER — Telehealth: Payer: Self-pay | Admitting: Neurology

## 2020-03-09 NOTE — Telephone Encounter (Signed)
There are refills on file at the pharmacy. 90-day x 3 sent 11/10/2019. We completed a tier exception for this medication in 2020. We can attempt this again for 2021.  Pt still has pharmacy coverage w/ [employer]. Pt ID#[id number]. He has medication at home to take while we are working on this for him.  Tier exception case initiated on covermymeds [facility]). Decision pending.

## 2020-03-09 NOTE — Telephone Encounter (Signed)
1) Medication(s) Requested (by name): rasagiline (AZILECT) 1 MG TABS tablet   2) Pharmacy of Choice: [facility] [address] [place], [place] - [address]  [address] [place] [address]  3) Special Requests:   Pt wife called to inform they received a letter in the mail in regards to the cost for the pt's medication and were wanting to know if they could get assistance again for the medication

## 2020-03-09 NOTE — Telephone Encounter (Signed)
The tier exception is approved through 12/24/2020. I called [facility] who confirmed that a 90-day supply is $0 co-pay now. I returned the call to the patient's wife and provided this update.

## 2020-04-12 DIAGNOSIS — L570 Actinic keratosis: Secondary | ICD-10-CM | POA: Diagnosis not present

## 2020-04-12 DIAGNOSIS — L905 Scar conditions and fibrosis of skin: Secondary | ICD-10-CM | POA: Diagnosis not present

## 2020-04-21 ENCOUNTER — Ambulatory Visit: Payer: Medicare HMO | Admitting: Neurology

## 2020-04-21 ENCOUNTER — Other Ambulatory Visit: Payer: Self-pay

## 2020-04-21 ENCOUNTER — Encounter: Payer: Self-pay | Admitting: Neurology

## 2020-04-21 VITALS — BP 128/81 | HR 80 | Temp 97.4°F | Ht 73.0 in | Wt 212.4 lb

## 2020-04-21 DIAGNOSIS — G20 Parkinson's disease: Secondary | ICD-10-CM

## 2020-04-21 MED ORDER — RASAGILINE MESYLATE 1 MG PO TABS: 1.0000 mg | ORAL_TABLET | Freq: Every day | ORAL | 3 refills | Status: DC

## 2020-04-21 MED ORDER — CARBIDOPA-LEVODOPA 25-100 MG PO TABS: 1.5000 | ORAL_TABLET | Freq: Three times a day (TID) | ORAL | 4 refills | Status: DC

## 2020-04-21 NOTE — Patient Instructions (Signed)
It was great to meet you today! Increase Sinemet 1.5 tablets 3 times daily Continue Azilect See you back in 6 months

## 2020-04-21 NOTE — Progress Notes (Signed)
PATIENT: Howard Fisher DOB: Mar 12, 1941  REASON FOR VISIT: follow up HISTORY FROM: patient  HISTORY OF PRESENT ILLNESS: Today 04/21/20  HISTORY HISTORY OF PRESENT ILLNESS: Howard Fisher a 79 year old male, seen in refer by his primary care doctor Shelda Pal, for evaluation of tremor, involving his right arm and right hand, initial evaluation was onOctober 16 2018.  I have reviewed and summarized referring note, he has history of hypertension, diabetes, hyperlipidemia,  Since May of 2018, he noticed gradual onset right hand tremor, most noticeable when he is resting his right arm, he denies significant weakness, no left hand involvement,  He had a history of right shoulder injury in 2016 after he fell and landed on his right shoulder.  He denies loss sense of smell, he denies REM sleep disorder, no dizziness, chronic constipation, no family history of Parkinson's disease, he denies gait abnormality.  MRI of the brain November 2017 at the [facility] showed atrophy and small vessel disease no acute abnormality.  Laboratory evaluation in August 2018, normal magnesium, CBC, lipid profile, A1c was 6.1, normal TSH,  Update January 15, 2018. 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 He started to take Azilect 1 mg daily since October, getting prescription from [place], he denies significant sore, no significant improvement  UPDATE July 15 2018:YY He is accompanied by his wife at today's clinical visit, he is taking Sinemet 25/100 mg 3 times a day at9, 2, 8 PM, no significant side effect noticed, there was no wearing off noticed, he has mild limitation mainly due to pain in his shoulders, no significant gait abnormality, He has a lot of stress due to recent move, has been moody  UPDATE July 22, 2019, He is  with his wife, his Parkinson's disease is overall under good control, getting Azilect 1 mg daily from taking Sinemet 25/100 mg 3 times a day he ambulated without difficulty  Update April 21, 2020 SS: Here today with his wife, feels since getting his Covid vaccines in March/April, his tremor in his right hand has worsened.  Noticed tremor increases during times of stress/excitement.  They are currently selling their house, downsizing to a town home in [place].  He is retired, enjoys Marketing executive once a week.  He is mowing the lawn.  He denies falls.  He sleeps well.  Interested in trying slight increase in Sinemet.  No freezing episodes.  REVIEW OF SYSTEMS: Out of a complete 14 system review of symptoms, the patient complains only of the following symptoms, and all other reviewed systems are negative.  Tremor  ALLERGIES: Allergies  Allergen Reactions  . Penicillins     REACTION: rash and itching    HOME MEDICATIONS: Outpatient Medications Prior to Visit  Medication Sig Dispense Refill  . aspirin 81 MG tablet Take 81 mg by mouth daily.     . Blood Glucose Monitoring Suppl (ONE TOUCH ULTRA 2) w/Device KIT Use once daily to check blood sugar.  DX E11.9 1 each 0  . glucose blood (ONE TOUCH ULTRA TEST) test strip Use once daily to check blood sugar.  DXE11.9 50 each 6  . lisinopril-hydrochlorothiazide (ZESTORETIC) 20-12.5 MG tablet Take 1 tablet by mouth daily. 90 tablet 3  . omeprazole (PRILOSEC) 20 MG capsule TAKE 1 CAPSULE DAILY 90 capsule 3  . ONETOUCH DELICA LANCETS 53G MISC Use once daily to check  blood sugar.  DX E11.9 100 each 6  . simvastatin (ZOCOR) 40 MG tablet TAKE 1 TABLET AT BEDTIME 90 tablet 3  . TURMERIC CURCUMIN PO Take 1 tablet by mouth daily.    . carbidopa-levodopa (SINEMET IR) 25-100 MG tablet Take 1 tablet by mouth 3 (three) times daily. 270 tablet 4  . rasagiline (AZILECT) 1 MG TABS tablet Take 1 tablet (1 mg total) by mouth daily. 90 tablet 3   No  facility-administered medications prior to visit.    PAST MEDICAL HISTORY: Past Medical History:  Diagnosis Date  . Allergic rhinitis   . 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 Diabetes mellitus without complication ([place])   . GERD (gastroesophageal reflux disease)   . Hiatal hernia   . History of stomach ulcers    1985  . Hypercholesteremia   . Hypertension   . Melanoma ([place]) 2014   shoulder  . Parkinson disease ([place])   . Peyronie's disease     PAST SURGICAL HISTORY: Past Surgical History:  Procedure Laterality Date  . basal cell removed from face  12/2012  . CATARACT EXTRACTION Bilateral 11/2009   Dr. Charise Killian  . CHOLECYSTECTOMY  02/1993   Dr. Harlow Asa  . right hernia repair  2002   Dr. Deon Pilling  . skin cancer removed from right shoulder  11/2013   melanoma    FAMILY HISTORY: Family History  Problem Relation Age of Onset  . Diabetes Mother   . Dementia Mother   . Dementia Father   . Diabetes Sister   . Healthy Son        x2  . Healthy Daughter        x1  . Colon cancer Neg Hx     SOCIAL HISTORY: Social History   Socioeconomic History  . Marital status: Married    Spouse name: [name]  . Number of children: 3  . Years of education: One year college  . Highest education level: Not on file  Occupational History  . Occupation: retired Nurse, mental health  . Smoking status: Never Smoker  . Smokeless tobacco: Never Used  Substance and Sexual Activity  . Alcohol use: Yes    Alcohol/week: 1.0 standard drinks    Types: 1 Glasses of wine per week    Comment: 3 drinks per week  . Drug use: No  . Sexual activity: Not on file  Other Topics Concern  . Not on file  Social History Narrative   Lives at home with his wife.   Right-handed.   2 cups coffee in the morning.   Social Determinants of Health   Financial Resource Strain:   . Difficulty of Paying Living Expenses:   Food Insecurity:   . Worried About Charity fundraiser in the Last Year:   . Youth worker in the Last Year:   Transportation Needs:   . Film/video editor (Medical):   [name] Lack of Transportation (Non-Medical):   Physical Activity:   . Days of Exercise per Week:   . Minutes of Exercise per Session:   Stress:   . Feeling of Stress :   Social Connections:   . Frequency of Communication with Friends and Family:   . Frequency of Social Gatherings with Friends and Family:   . Attends Religious Services:   . Active Member of Clubs or Organizations:   . Attends Archivist Meetings:   [name] Marital Status:   Intimate Partner Violence:   .  Fear of Current or Ex-Partner:   . Emotionally Abused:   [name] Physically Abused:   . Sexually Abused:    PHYSICAL EXAM  Vitals:   04/21/20 1033 04/21/20 1048  BP: (!) 154/100 128/81  Pulse: 75 80  Temp: (!) 97.4 F (36.3 C)   SpO2: 94%   Weight: 212 lb 6.4 oz (96.3 kg)   Height: '6\' 1"'  (1.854 m)    Body mass index is 28.02 kg/m.  Generalized: Well developed, in no acute distress   Neurological examination  Mentation: Alert oriented to time, place, history taking. Follows all commands speech and language fluent, mild masking of the face is seen Cranial nerve II-XII: Pupils were equal round reactive to light. Extraocular movements were full, visual field were full on confrontational test. Facial sensation and strength were normal.  Head turning and shoulder shrug  were normal and symmetric. Motor: The motor testing reveals 5 over 5 strength of all 4 extremities. Good symmetric motor tone is noted throughout. With reinforcement, mild rigidity right wrist, mild bradykinesia right hand. Mild to moderate right hand resting tremor Sensory: Sensory testing is intact to soft touch on all 4 extremities. No evidence of extinction is noted.  Coordination: Cerebellar testing reveals good finger-nose-finger and heel-to-shin bilaterally.  Gait and station: Able to rise from seated position without pushoff, normal stance, good stride,  slight decreased arm swing on the right, smooth turning, no assistive device Reflexes: Deep tendon reflexes are symmetric and normal bilaterally.   DIAGNOSTIC DATA (LABS, IMAGING, TESTING) - I reviewed patient records, labs, notes, testing and imaging myself where available.  Lab Results  Component Value Date   WBC 6.9 02/07/2018   HGB 15.9 02/07/2018   HCT 45.4 02/07/2018   MCV 94.2 02/07/2018   PLT 204.0 02/07/2018      Component Value Date/Time   NA 139 02/24/2020 0917   K 4.0 02/24/2020 0917   CL 101 02/24/2020 0917   CO2 28 02/24/2020 0917   GLUCOSE 139 (H) 02/24/2020 0917   BUN 14 02/24/2020 0917   CREATININE 1.03 02/24/2020 0917   CREATININE 1.03 04/17/2014 1026   CALCIUM 9.8 02/24/2020 0917   PROT 7.1 02/24/2020 0917   ALBUMIN 4.4 02/24/2020 0917   AST 23 02/24/2020 0917   ALT 11 02/24/2020 0917   ALKPHOS 59 02/24/2020 0917   BILITOT 0.7 02/24/2020 0917   GFRNONAA 70.39 12/30/2009 1014   GFRAA 86 12/17/2008 0946   Lab Results  Component Value Date   CHOL 149 02/24/2020   HDL 39.40 02/24/2020   LDLCALC 81 02/24/2020   LDLDIRECT 94.6 01/15/2014   TRIG 144.0 02/24/2020   CHOLHDL 4 02/24/2020   Lab Results  Component Value Date   HGBA1C 6.6 (H) 02/24/2020   No results found for: VITAMINB12 Lab Results  Component Value Date   TSH 4.36 09/27/2016     ASSESSMENT AND PLAN 79 y.o. year old male  has a past medical history of Allergic rhinitis, Basal cell adenocarcinoma (2015), Diabetes mellitus without complication ([place]), GERD (gastroesophageal reflux disease), Hiatal hernia, History of stomach ulcers, Hypercholesteremia, Hypertension, Melanoma ([place]) (2014), Parkinson disease ([place]), and Peyronie's disease. here with:  1.  Idiopathic Parkinson's disease -Overall stable, but feeling worsening tremor of the right hand, interested in slight dose increase of Sinemet, on exam mild to moderate right hand resting tremor, mild rigidity, mild bradykinesia -We will  increase Sinemet 25-100 mg, 1.5 tablets 3 times a day, discussed tremor often difficult symptom of PD to treat, if no  change, will go back to previous dose -Continue Azilect 1 mg daily -Encouraged moderate exercise -Follow-up in 6 months or sooner if needed  I spent 30 minutes of face-to-face and non-face-to-face time with patient.  This included previsit chart review, lab review, study review, order entry, electronic health record documentation, patient education.   Butler Denmark, AGNP-C, DNP 04/21/2020, 11:14 AM [facility] [address] [place], [place] [address] [phone]

## 2020-05-04 NOTE — Progress Notes (Signed)
I have reviewed and agreed above plan. 

## 2020-08-12 ENCOUNTER — Telehealth: Payer: Self-pay

## 2020-08-12 NOTE — Telephone Encounter (Signed)
Caller states he left side of neck has been sore on an off and shoulder and arm is also hurting. caller states he wants an earlier appt then 9/10. Telephone: [phone]

## 2020-08-13 NOTE — Telephone Encounter (Signed)
Is it ok to move patient's appointment to an earlier date

## 2020-08-13 NOTE — Telephone Encounter (Signed)
Appointment to moved to 8/25

## 2020-08-13 NOTE — Telephone Encounter (Signed)
OK to see if we have anything sooner. Ty.

## 2020-08-18 ENCOUNTER — Encounter: Payer: Self-pay | Admitting: Family Medicine

## 2020-08-18 ENCOUNTER — Other Ambulatory Visit: Payer: Self-pay

## 2020-08-18 ENCOUNTER — Ambulatory Visit (INDEPENDENT_AMBULATORY_CARE_PROVIDER_SITE_OTHER): Payer: Medicare HMO | Admitting: Family Medicine

## 2020-08-18 VITALS — BP 138/84 | HR 79 | Temp 98.5°F | Ht 73.0 in | Wt 213.0 lb

## 2020-08-18 DIAGNOSIS — E1165 Type 2 diabetes mellitus with hyperglycemia: Secondary | ICD-10-CM | POA: Diagnosis not present

## 2020-08-18 DIAGNOSIS — N3941 Urge incontinence: Secondary | ICD-10-CM

## 2020-08-18 DIAGNOSIS — M542 Cervicalgia: Secondary | ICD-10-CM

## 2020-08-18 DIAGNOSIS — I499 Cardiac arrhythmia, unspecified: Secondary | ICD-10-CM

## 2020-08-18 NOTE — Patient Instructions (Addendum)
Give [place] 2-3 business days to get the results of your labs back.   Keep the diet clean and stay active.  Try Krill oil for your joints. Olive oil is OK, but doesn't have a lot of data for joint pain or digestion to my knowledge.  You have urge incontinence. Mind the caffeine and alcohol intake. If you wish to start a medicine for this, let me know and we can initiate.   Let us know if you need anything.  EXERCISES RANGE OF MOTION (ROM) AND STRETCHING EXERCISES  These exercises may help you when beginning to rehabilitate your issue. In order to successfully resolve your symptoms, you must improve your posture. These exercises are designed to help reduce the forward-head and rounded-shoulder posture which contributes to this condition. Your symptoms may resolve with or without further involvement from your physician, physical therapist or athletic trainer. While completing these exercises, remember:   Restoring tissue flexibility helps normal motion to return to the joints. This allows healthier, less painful movement and activity.  An effective stretch should be held for at least 20 seconds, although you may need to begin with shorter hold times for comfort.  A stretch should never be painful. You should only feel a gentle lengthening or release in the stretched tissue.  Do not do any stretch or exercise that you cannot tolerate.  STRETCH- Axial Extensors  Lie on your back on the floor. You may bend your knees for comfort. Place a rolled-up hand towel or dish towel, about 2 inches in diameter, under the part of your head that makes contact with the floor.  Gently tuck your chin, as if trying to make a "double chin," until you feel a gentle stretch at the base of your head.  Hold 15-20 seconds. Repeat 2-3 times. Complete this exercise 1 time per day.   STRETCH - Axial Extension   Stand or sit on a firm surface. Assume a good posture: chest up, shoulders drawn back, abdominal muscles  slightly tense, knees unlocked (if standing) and feet hip width apart.  Slowly retract your chin so your head slides back and your chin slightly lowers. Continue to look straight ahead.  You should feel a gentle stretch in the back of your head. Be certain not to feel an aggressive stretch since this can cause headaches later.  Hold for 15-20 seconds. Repeat 2-3 times. Complete this exercise 1 time per day.  STRETCH - Cervical Side Bend   Stand or sit on a firm surface. Assume a good posture: chest up, shoulders drawn back, abdominal muscles slightly tense, knees unlocked (if standing) and feet hip width apart.  Without letting your nose or shoulders move, slowly tip your right / left ear to your shoulder until your feel a gentle stretch in the muscles on the opposite side of your neck.  Hold 15-20 seconds. Repeat 2-3 times. Complete this exercise 1-2 times per day.  STRETCH - Cervical Rotators   Stand or sit on a firm surface. Assume a good posture: chest up, shoulders drawn back, abdominal muscles slightly tense, knees unlocked (if standing) and feet hip width apart.  Keeping your eyes level with the ground, slowly turn your head until you feel a gentle stretch along the back and opposite side of your neck.  Hold 15-20 seconds. Repeat 2-3 times. Complete this exercise 1-2 times per day.  RANGE OF MOTION - Neck Circles   Stand or sit on a firm surface. Assume a good posture: chest up,  shoulders drawn back, abdominal muscles slightly tense, knees unlocked (if standing) and feet hip width apart.  Gently roll your head down and around from the back of one shoulder to the back of the other. The motion should never be forced or painful.  Repeat the motion 10-20 times, or until you feel the neck muscles relax and loosen. Repeat 2-3 times. Complete the exercise 1-2 times per day. STRENGTHENING EXERCISES - Cervical Strain and Sprain These exercises may help you when beginning to  rehabilitate your injury. They may resolve your symptoms with or without further involvement from your physician, physical therapist, or athletic trainer. While completing these exercises, remember:   Muscles can gain both the endurance and the strength needed for everyday activities through controlled exercises.  Complete these exercises as instructed by your physician, physical therapist, or athletic trainer. Progress the resistance and repetitions only as guided.  You may experience muscle soreness or fatigue, but the pain or discomfort you are trying to eliminate should never worsen during these exercises. If this pain does worsen, stop and make certain you are following the directions exactly. If the pain is still present after adjustments, discontinue the exercise until you can discuss the trouble with your clinician.  STRENGTH - Cervical Flexors, Isometric  Face a wall, standing about 6 inches away. Place a small pillow, a ball about 6-8 inches in diameter, or a folded towel between your forehead and the wall.  Slightly tuck your chin and gently push your forehead into the soft object. Push only with mild to moderate intensity, building up tension gradually. Keep your jaw and forehead relaxed.  Hold 10 to 20 seconds. Keep your breathing relaxed.  Release the tension slowly. Relax your neck muscles completely before you start the next repetition. Repeat 2-3 times. Complete this exercise 1 time per day.  STRENGTH- Cervical Lateral Flexors, Isometric   Stand about 6 inches away from a wall. Place a small pillow, a ball about 6-8 inches in diameter, or a folded towel between the side of your head and the wall.  Slightly tuck your chin and gently tilt your head into the soft object. Push only with mild to moderate intensity, building up tension gradually. Keep your jaw and forehead relaxed.  Hold 10 to 20 seconds. Keep your breathing relaxed.  Release the tension slowly. Relax your neck  muscles completely before you start the next repetition. Repeat 2-3 times. Complete this exercise 1 time per day.  STRENGTH - Cervical Extensors, Isometric   Stand about 6 inches away from a wall. Place a small pillow, a ball about 6-8 inches in diameter, or a folded towel between the back of your head and the wall.  Slightly tuck your chin and gently tilt your head back into the soft object. Push only with mild to moderate intensity, building up tension gradually. Keep your jaw and forehead relaxed.  Hold 10 to 20 seconds. Keep your breathing relaxed.  Release the tension slowly. Relax your neck muscles completely before you start the next repetition. Repeat 2-3 times. Complete this exercise 1 time per day.  POSTURE AND BODY MECHANICS CONSIDERATIONS Keeping correct posture when sitting, standing or completing your activities will reduce the stress put on different body tissues, allowing injured tissues a chance to heal and limiting painful experiences. The following are general guidelines for improved posture. Your physician or physical therapist will provide you with any instructions specific to your needs. While reading these guidelines, remember:  The exercises prescribed by your  provider will help you have the flexibility and strength to maintain correct postures.  The correct posture provides the optimal environment for your joints to work. All of your joints have less wear and tear when properly supported by a spine with good posture. This means you will experience a healthier, less painful body.  Correct posture must be practiced with all of your activities, especially prolonged sitting and standing. Correct posture is as important when doing repetitive low-stress activities (typing) as it is when doing a single heavy-load activity (lifting).  PROLONGED STANDING WHILE SLIGHTLY LEANING FORWARD When completing a task that requires you to lean forward while standing in one place for a  long time, place either foot up on a stationary 2- to 4-inch high object to help maintain the best posture. When both feet are on the ground, the low back tends to lose its slight inward curve. If this curve flattens (or becomes too large), then the back and your other joints will experience too much stress, fatigue more quickly, and can cause pain.   RESTING POSITIONS Consider which positions are most painful for you when choosing a resting position. If you have pain with flexion-based activities (sitting, bending, stooping, squatting), choose a position that allows you to rest in a less flexed posture. You would want to avoid curling into a fetal position on your side. If your pain worsens with extension-based activities (prolonged standing, working overhead), avoid resting in an extended position such as sleeping on your stomach. Most people will find more comfort when they rest with their spine in a more neutral position, neither too rounded nor too arched. Lying on a non-sagging bed on your side with a pillow between your knees, or on your back with a pillow under your knees will often provide some relief. Keep in mind, being in any one position for a prolonged period of time, no matter how correct your posture, can still lead to stiffness.  WALKING Walk with an upright posture. Your ears, shoulders, and hips should all line up. OFFICE WORK When working at a desk, create an environment that supports good, upright posture. Without extra support, muscles fatigue and lead to excessive strain on joints and other tissues.  CHAIR:  A chair should be able to slide under your desk when your back makes contact with the back of the chair. This allows you to work closely.  The chair's height should allow your eyes to be level with the upper part of your monitor and your hands to be slightly lower than your elbows.  Body position: ? Your feet should make contact with the floor. If this is not possible, use  a foot rest. ? Keep your ears over your shoulders. This will reduce stress on your neck and low back.

## 2020-08-18 NOTE — Progress Notes (Signed)
Musculoskeletal Exam  Patient: Howard Fisher DOB: 12/01/1941  DOS: 08/18/2020  SUBJECTIVE:  Chief Complaint:   Chief Complaint  Patient presents with  . Neck Pain    Howard Fisher is a 79 y.o.  male for evaluation and treatment of neck pain.   Onset:  2 months ago. Choked on some spicy candy and started coughing and seemed to started in.  Location: L side of neck Character:  dull  Progression of issue:  is unchanged Associated symptoms: no redness, swelling, bruising, loss of ROM Treatment: to date has been: Back and Body.   Neurovascular symptoms: no  DM2 Patient sugars in the low 100s.  Diet is healthy.  He golfs for exercise.  Tries walking as well.  Compliant with statin, he is diet controlled.  Past Medical History:  Diagnosis Date  . Allergic rhinitis   . 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 Diabetes mellitus without complication ([place])   . GERD (gastroesophageal reflux disease)   . Hiatal hernia   . History of stomach ulcers    1985  . Hypercholesteremia   . Hypertension   . Melanoma ([place]) 2014   shoulder  . Parkinson disease ([place])   . Peyronie's disease     Objective: VITAL SIGNS: BP 138/84 (BP Location: Left Arm, Patient Position: Sitting, Cuff Size: Normal)   Pulse 79   Temp 98.5 F (36.9 C) (Oral)   Ht 6\' 1"  (1.854 m)   Wt 213 lb (96.6 kg)   SpO2 94%   BMI 28.10 kg/m  Constitutional: Well formed, well developed. No acute distress. Cardiac: Reg rate, irreg rhythm, no LE edema Thorax & Lungs: CTAB. No accessory muscle use Musculoskeletal: neck.   Normal active range of motion: yes.   Normal passive range of motion: yes Tenderness to palpation: mil dttp over lateral portion of neck Deformity: no Ecchymosis: no Tests positive: none Tests negative: Spurling's Neurologic: Normal sensory function. No focal deficits noted. DTR's equal and symmetric in UE's. No clonus. Psychiatric: Normal mood. Age appropriate judgment and insight. Alert &  oriented x 3.    Assessment:  Neck pain  Type 2 diabetes mellitus with hyperglycemia, without long-term current use of insulin ([place]) - Plan: Hemoglobin A1c  Urge incontinence  Irregular heartbeat - Plan: EKG 12-Lead  Plan: 1. Stretches/exercises, heat, ice, Tylenol.  If no improvement, will refer to physical therapy. 2.  Check A1c.  Continue current therapy pending results. 3.  Offered medication, declined at this time.  He will let us know if he changes his mind. 4. EKG shows normal sinus rhythm with an ectopic ventricular beat.  No interval abnormalities, ischemia, good R wave progression.  No further work-up. F/u in 6 months for physical or as needed. The patient voiced understanding and agreement to the plan.   [place], DO 08/18/20  3:49 PM

## 2020-08-19 LAB — HEMOGLOBIN A1C
Hgb A1c MFr Bld: 6.4 %{Hb} — ABNORMAL HIGH
Mean Plasma Glucose: 137 (calc)
eAG (mmol/L): 7.6 (calc)

## 2020-08-27 ENCOUNTER — Ambulatory Visit: Payer: Medicare HMO | Admitting: Family Medicine

## 2020-08-27 DIAGNOSIS — H43813 Vitreous degeneration, bilateral: Secondary | ICD-10-CM | POA: Diagnosis not present

## 2020-08-27 DIAGNOSIS — H02135 Senile ectropion of left lower eyelid: Secondary | ICD-10-CM | POA: Diagnosis not present

## 2020-08-27 DIAGNOSIS — H5201 Hypermetropia, right eye: Secondary | ICD-10-CM | POA: Diagnosis not present

## 2020-08-27 DIAGNOSIS — H16223 Keratoconjunctivitis sicca, not specified as Sjogren's, bilateral: Secondary | ICD-10-CM | POA: Diagnosis not present

## 2020-08-27 DIAGNOSIS — H524 Presbyopia: Secondary | ICD-10-CM | POA: Diagnosis not present

## 2020-08-27 DIAGNOSIS — H02132 Senile ectropion of right lower eyelid: Secondary | ICD-10-CM | POA: Diagnosis not present

## 2020-08-27 DIAGNOSIS — H5212 Myopia, left eye: Secondary | ICD-10-CM | POA: Diagnosis not present

## 2020-08-27 DIAGNOSIS — H52203 Unspecified astigmatism, bilateral: Secondary | ICD-10-CM | POA: Diagnosis not present

## 2020-08-27 DIAGNOSIS — Z961 Presence of intraocular lens: Secondary | ICD-10-CM | POA: Diagnosis not present

## 2020-08-27 DIAGNOSIS — D3132 Benign neoplasm of left choroid: Secondary | ICD-10-CM | POA: Diagnosis not present

## 2020-09-03 ENCOUNTER — Ambulatory Visit: Payer: Medicare HMO | Admitting: Family Medicine

## 2020-10-25 ENCOUNTER — Ambulatory Visit: Payer: Medicare HMO | Admitting: Neurology

## 2020-10-25 ENCOUNTER — Encounter: Payer: Self-pay | Admitting: Neurology

## 2020-10-25 VITALS — BP 130/86 | HR 72 | Ht 73.0 in | Wt 218.4 lb

## 2020-10-25 DIAGNOSIS — G20 Parkinson's disease: Secondary | ICD-10-CM | POA: Diagnosis not present

## 2020-10-25 MED ORDER — CARBIDOPA-LEVODOPA 25-100 MG PO TABS: 1.5000 | ORAL_TABLET | Freq: Three times a day (TID) | ORAL | 4 refills | Status: DC

## 2020-10-25 MED ORDER — RASAGILINE MESYLATE 1 MG PO TABS: 1.0000 mg | ORAL_TABLET | Freq: Every day | ORAL | 3 refills | Status: DC

## 2020-10-25 NOTE — Progress Notes (Signed)
PATIENT: Howard Fisher DOB: 01-28-1941  REASON FOR VISIT: follow up HISTORY FROM: patient  HISTORY OF PRESENT ILLNESS: Today 10/25/20  HISTORY  HISTORY OF PRESENT ILLNESS: Howard Fisher a 79 year old male, seen in refer by his primary care doctor Shelda Pal, for evaluation of tremor, involving his right arm and right hand, initial evaluation was onOctober 16 2018.  I have reviewed and summarized referring note, he has history of hypertension, diabetes, hyperlipidemia,  Since May of 2018, he noticed gradual onset right hand tremor, most noticeable when he is resting his right arm, he denies significant weakness, no left hand involvement,  He had a history of right shoulder injury in 2016 after he fell and landed on his right shoulder.  He denies loss sense of smell, he denies REM sleep disorder, no dizziness, chronic constipation, no family history of Parkinson's disease, he denies gait abnormality.  MRI of the brain November 2017 at the [facility] showed atrophy and small vessel disease no acute abnormality.  Laboratory evaluation in August 2018, normal magnesium, CBC, lipid profile, A1c was 6.1, normal TSH,  Update January 15, 2018. 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 He started to take Azilect 1 mg daily since October, getting prescription from [place], he denies significant sore, no significant improvement  UPDATE July 15 2018:YY He is accompanied by his wife at today's clinical visit, he is taking Sinemet 25/100 mg 3 times a day at9, 2, 8 PM, no significant side effect noticed, there was no wearing off noticed, he has mild limitation mainly due to pain in his shoulders, no significant gait abnormality, He has a lot of stress due to recent move, has been moody  UPDATEJuly 28, 2020, He is  with his wife, his Parkinson's disease is overall under good control, getting Azilect 1 mg daily from taking Sinemet 25/100 mg 3 times a day he ambulated without difficulty  Update April 21, 2020 SS: Here today with his wife, feels since getting his Covid vaccines in March/April, his tremor in his right hand has worsened.  Noticed tremor increases during times of stress/excitement.  They are currently selling their house, downsizing to a town home in [place].  He is retired, enjoys Marketing executive once a week.  He is mowing the lawn.  He denies falls.  He sleeps well.  Interested in trying slight increase in Sinemet.  No freezing episodes.  Update October 25, 2020 SS: Here today with his wife, was to increase Sinemet 25/100 mg 1.5 tablets 3 times a day, only increased evening dose to 1.5 tablets.  Overall doing well, does have right resting tremor, overtime more trouble drinking.  Does not exercise consistently, does play golf once a week.  No falls or trouble chewing.  No freezing episodes.  On Azilect 1 mg daily, has tier exception through end of year.  Feels doing well. Takes Sinemet 8, 12, 7.  REVIEW OF SYSTEMS: Out of a complete 14 system review of symptoms, the patient complains only of the following symptoms, and all other reviewed systems are negative.  Tremor  ALLERGIES: Allergies  Allergen Reactions  . Penicillins     REACTION: rash and itching    HOME MEDICATIONS: Outpatient Medications Prior to Visit  Medication Sig Dispense Refill  . Blood Glucose Monitoring Suppl (ONE TOUCH ULTRA 2) w/Device KIT Use once daily to check blood  sugar.  DX E11.9 1 each 0  . glucose blood (ONE TOUCH ULTRA TEST) test strip Use once daily to check blood sugar.  DXE11.9 50 each 6  . lisinopril-hydrochlorothiazide (ZESTORETIC) 20-12.5 MG tablet Take 1 tablet by mouth daily. 90 tablet 3  . omeprazole (PRILOSEC) 20 MG capsule TAKE 1 CAPSULE DAILY 90 capsule 3  . ONETOUCH DELICA LANCETS 42A MISC Use  once daily to check blood sugar.  DX E11.9 100 each 6  . simvastatin (ZOCOR) 40 MG tablet TAKE 1 TABLET AT BEDTIME 90 tablet 3  . TURMERIC CURCUMIN PO Take 1 tablet by mouth daily.    . carbidopa-levodopa (SINEMET IR) 25-100 MG tablet Take 1.5 tablets by mouth 3 (three) times daily. 400 tablet 4  . rasagiline (AZILECT) 1 MG TABS tablet Take 1 tablet (1 mg total) by mouth daily. 90 tablet 3   No facility-administered medications prior to visit.    PAST MEDICAL HISTORY: Past Medical History:  Diagnosis Date  . Allergic rhinitis   . 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 Diabetes mellitus without complication ([place])   . GERD (gastroesophageal reflux disease)   . Hiatal hernia   . History of stomach ulcers    1985  . Hypercholesteremia   . Hypertension   . Melanoma ([place]) 2014   shoulder  . Parkinson disease ([place])   . Peyronie's disease     PAST SURGICAL HISTORY: Past Surgical History:  Procedure Laterality Date  . basal cell removed from face  12/2012  . CATARACT EXTRACTION Bilateral 11/2009   Dr. Charise Killian  . CHOLECYSTECTOMY  02/1993   Dr. Harlow Asa  . right hernia repair  2002   Dr. Deon Pilling  . skin cancer removed from right shoulder  11/2013   melanoma    FAMILY HISTORY: Family History  Problem Relation Age of Onset  . Diabetes Mother   . Dementia Mother   . Dementia Father   . Diabetes Sister   . Healthy Son        x2  . Healthy Daughter        x1  . Colon cancer Neg Hx     SOCIAL HISTORY: Social History   Socioeconomic History  . Marital status: Married    Spouse name: [name]  . Number of children: 3  . Years of education: One year college  . Highest education level: Not on file  Occupational History  . Occupation: retired Nurse, mental health  . Smoking status: Never Smoker  . Smokeless tobacco: Never Used  Vaping Use  . Vaping Use: Never used  Substance and Sexual Activity  . Alcohol use: Yes    Alcohol/week: 1.0 standard drink    Types: 1 Glasses  of wine per week    Comment: 3 drinks per week  . Drug use: No  . Sexual activity: Not on file  Other Topics Concern  . Not on file  Social History Narrative   Lives at home with his wife.   Right-handed.   2 cups coffee in the morning.   Social Determinants of Health   Financial Resource Strain:   . Difficulty of Paying Living Expenses: Not on file  Food Insecurity:   . Worried About Charity fundraiser in the Last Year: Not on file  . Ran Out of Food in the Last Year: Not on file  Transportation Needs:   . Lack of Transportation (Medical): Not on file  . Lack of Transportation (Non-Medical): Not on  file  Physical Activity:   . Days of Exercise per Week: Not on file  . Minutes of Exercise per Session: Not on file  Stress:   . Feeling of Stress : Not on file  Social Connections:   . Frequency of Communication with Friends and Family: Not on file  . Frequency of Social Gatherings with Friends and Family: Not on file  . Attends Religious Services: Not on file  . Active Member of Clubs or Organizations: Not on file  . Attends Archivist Meetings: Not on file  . Marital Status: Not on file  Intimate Partner Violence:   . Fear of Current or Ex-Partner: Not on file  . Emotionally Abused: Not on file  . Physically Abused: Not on file  . Sexually Abused: Not on file    PHYSICAL EXAM  Vitals:   10/25/20 1048  BP: 130/86  Pulse: 72  Weight: 218 lb 6.4 oz (99.1 kg)  Height: '6\' 1"'  (1.854 m)   Body mass index is 28.81 kg/m.  Generalized: Well developed, in no acute distress  Neurological examination  Mentation: Alert oriented to time, place, history taking. Follows all commands speech and language fluent Cranial nerve II-XII: Pupils were equal round reactive to light. Extraocular movements were full, visual field were full on confrontational test. Facial sensation and strength were normal. Head turning and shoulder shrug were normal and symmetric. Motor: The motor  testing reveals 5 over 5 strength of all 4 extremities. Good symmetric motor tone is noted throughout.  No significant bradykinesia, right hand resting tremor, slight increased rigidity right upper extremity with reinforcement. Sensory: Sensory testing is intact to soft touch on all 4 extremities. No evidence of extinction is noted.  Coordination: Cerebellar testing reveals good finger-nose-finger and heel-to-shin bilaterally.  No tremor seen. Gait and station: Good pace, good strides, slight decreased arm swing on the right, resting tremor, good turns, overall steady. Reflexes: Deep tendon reflexes are symmetric and normal bilaterally.   DIAGNOSTIC DATA (LABS, IMAGING, TESTING) - I reviewed patient records, labs, notes, testing and imaging myself where available.  Lab Results  Component Value Date   WBC 6.9 02/07/2018   HGB 15.9 02/07/2018   HCT 45.4 02/07/2018   MCV 94.2 02/07/2018   PLT 204.0 02/07/2018      Component Value Date/Time   NA 139 02/24/2020 0917   K 4.0 02/24/2020 0917   CL 101 02/24/2020 0917   CO2 28 02/24/2020 0917   GLUCOSE 139 (H) 02/24/2020 0917   BUN 14 02/24/2020 0917   CREATININE 1.03 02/24/2020 0917   CREATININE 1.03 04/17/2014 1026   CALCIUM 9.8 02/24/2020 0917   PROT 7.1 02/24/2020 0917   ALBUMIN 4.4 02/24/2020 0917   AST 23 02/24/2020 0917   ALT 11 02/24/2020 0917   ALKPHOS 59 02/24/2020 0917   BILITOT 0.7 02/24/2020 0917   GFRNONAA 70.39 12/30/2009 1014   GFRAA 86 12/17/2008 0946   Lab Results  Component Value Date   CHOL 149 02/24/2020   HDL 39.40 02/24/2020   LDLCALC 81 02/24/2020   LDLDIRECT 94.6 01/15/2014   TRIG 144.0 02/24/2020   CHOLHDL 4 02/24/2020   Lab Results  Component Value Date   HGBA1C 6.4 (H) 08/18/2020   No results found for: VITAMINB12 Lab Results  Component Value Date   TSH 4.36 09/27/2016   ASSESSMENT AND PLAN 79 y.o. year old male  has a past medical history of Allergic rhinitis, Basal cell adenocarcinoma  (2015), Diabetes mellitus without complication ([place]),  GERD (gastroesophageal reflux disease), Hiatal hernia, History of stomach ulcers, Hypercholesteremia, Hypertension, Melanoma ([place]) (2014), Parkinson disease ([place]), and Peyronie's disease. here with:  1.  Idiopathic Parkinson's disease -Increase Sinemet 25/100 mg, 1.5 tablets 3 times a day (increase to help with right tremor this is most bothersome, but tremor can be difficult to change) -Continue Azilect 1 mg daily (may need help with tier exception for next calendar year) -Encouraged moderate exercise -Follow-up in 6 months or sooner if needed  I spent 30 minutes of face-to-face and non-face-to-face time with patient.  This included previsit chart review, lab review, study review, order entry, electronic health record documentation, patient education.  Butler Denmark, AGNP-C, DNP 10/25/2020, 11:44 AM [facility] [address] [place], [place] [address] [phone]

## 2020-10-25 NOTE — Patient Instructions (Signed)
Continue current medications, try taking Sinemet 1.5 tablets 3 times daily  Will check on the Azilect for tier exception  See you back in 6 months  Try to get into some exercise

## 2020-12-27 ENCOUNTER — Telehealth: Payer: Self-pay

## 2020-12-27 MED ORDER — SIMVASTATIN 40 MG PO TABS: 40.0000 mg | ORAL_TABLET | Freq: Every day | ORAL | 3 refills | Status: DC

## 2020-12-27 NOTE — Telephone Encounter (Signed)
Rx sent 

## 2020-12-27 NOTE — Telephone Encounter (Signed)
Pt called needing refill on Simvastatin 40mg  tab.  Pharm[facility] is [facility] order.

## 2021-02-28 ENCOUNTER — Ambulatory Visit (INDEPENDENT_AMBULATORY_CARE_PROVIDER_SITE_OTHER): Payer: Medicare HMO | Admitting: Family Medicine

## 2021-02-28 ENCOUNTER — Other Ambulatory Visit: Payer: Self-pay

## 2021-02-28 ENCOUNTER — Encounter: Payer: Self-pay | Admitting: Family Medicine

## 2021-02-28 VITALS — BP 144/84 | HR 80 | Temp 98.1°F | Resp 18 | Ht 73.0 in | Wt 221.6 lb

## 2021-02-28 DIAGNOSIS — Z1331 Encounter for screening for depression: Secondary | ICD-10-CM | POA: Diagnosis not present

## 2021-02-28 DIAGNOSIS — Z9181 History of falling: Secondary | ICD-10-CM

## 2021-02-28 DIAGNOSIS — R3915 Urgency of urination: Secondary | ICD-10-CM

## 2021-02-28 DIAGNOSIS — Z0001 Encounter for general adult medical examination with abnormal findings: Secondary | ICD-10-CM | POA: Diagnosis not present

## 2021-02-28 DIAGNOSIS — E1165 Type 2 diabetes mellitus with hyperglycemia: Secondary | ICD-10-CM

## 2021-02-28 LAB — LIPID PANEL
Cholesterol: 157 mg/dL (ref 0–200)
HDL: 43 mg/dL (ref >39.00)
LDL Cholesterol: 79 mg/dL (ref 0–99)
NonHDL: 114.44
Total CHOL/HDL Ratio: 4
Triglycerides: 179 mg/dL — ABNORMAL HIGH (ref 0.0–149.0)
VLDL: 35.8 mg/dL (ref 0.0–40.0)

## 2021-02-28 LAB — COMPREHENSIVE METABOLIC PANEL
ALT: 7 U/L (ref 0–53)
AST: 29 U/L (ref 0–37)
Albumin: 4.6 g/dL (ref 3.5–5.2)
Alkaline Phosphatase: 60 U/L (ref 39–117)
BUN: 11 mg/dL (ref 6–23)
CO2: 31 mEq/L (ref 19–32)
Calcium: 9.7 mg/dL (ref 8.4–10.5)
Chloride: 101 mEq/L (ref 96–112)
Creatinine, Ser: 1.12 mg/dL (ref 0.40–1.50)
GFR: 61.88 mL/min (ref >60.00)
Glucose, Bld: 144 mg/dL — ABNORMAL HIGH (ref 70–99)
Potassium: 3.9 mEq/L (ref 3.5–5.1)
Sodium: 141 mEq/L (ref 135–145)
Total Bilirubin: 0.9 mg/dL (ref 0.2–1.2)
Total Protein: 7.3 g/dL (ref 6.0–8.3)

## 2021-02-28 LAB — MICROALBUMIN / CREATININE URINE RATIO
Creatinine,U: 215.2 mg/dL
Microalb Creat Ratio: 0.4 mg/g (ref 0.0–30.0)
Microalb, Ur: 1 mg/dL (ref 0.0–1.9)

## 2021-02-28 LAB — HEMOGLOBIN A1C: Hgb A1c MFr Bld: 6.7 % — ABNORMAL HIGH (ref 4.6–6.5)

## 2021-02-28 MED ORDER — OMEPRAZOLE 20 MG PO CPDR: 20.0000 mg | DELAYED_RELEASE_CAPSULE | Freq: Every day | ORAL | 3 refills | Status: DC

## 2021-02-28 MED ORDER — LISINOPRIL-HYDROCHLOROTHIAZIDE 20-12.5 MG PO TABS: 1.0000 | ORAL_TABLET | Freq: Every day | ORAL | 3 refills | Status: DC

## 2021-02-28 MED ORDER — TAMSULOSIN HCL 0.4 MG PO CAPS: 0.4000 mg | ORAL_CAPSULE | Freq: Every day | ORAL | 1 refills | Status: DC

## 2021-02-28 NOTE — Progress Notes (Signed)
Chief Complaint  Patient presents with   Annual Exam    Concerns/ questions: needs refills to mail order.  Eye exam: pt says he has had a recent eye exam with Dr Raeford Razor Covid booster: updated    Well Male Howard Fisher is here for a complete physical.   His last physical was >1 year ago.  Current diet: in general, a "healthy" diet.   Current exercise: golfing, walk Weight trend: stable Fatigue out of ordinary? No. Seat belt? Yes.   Loss of interested in doing things or depression in past 2 weeks? No Falls in the last year? No  Health maintenance Shingrix- No Tetanus- Yes Pneumonia vaccine- Yes  Urinary complaints For the past year and a half, the patient has experienced urgency, weak stream, and slight incontinence.  It has gotten to the point where he wishes to take a medication.  He is not having any frequency, bleeding, discharge, or pain.  Bowel movements have been unremarkable.  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   Diabetes mellitus without complication (HCC)    GERD (gastroesophageal reflux disease)    Hiatal hernia    History of stomach ulcers    1985   Hypercholesteremia    Hypertension    Melanoma ([place]) 2014   shoulder   Parkinson disease ([place])    Peyronie's disease      Past Surgical History:  Procedure Laterality Date   basal cell removed from face  12/2012   CATARACT EXTRACTION Bilateral 11/2009   Dr. Charise Killian   CHOLECYSTECTOMY  02/1993   Dr. Harlow Asa   right hernia repair  2002   Dr. Deon Pilling   skin cancer removed from right shoulder  11/2013   melanoma    Medications  Current Outpatient Medications on File Prior to Visit  Medication Sig Dispense Refill   Blood Glucose Monitoring Suppl (ONE TOUCH ULTRA 2) w/Device KIT Use once daily to check blood sugar.  DX E11.9 1 each 0   carbidopa-levodopa (SINEMET IR) 25-100 MG tablet Take 1.5 tablets by mouth 3 (three) times  daily. 400 tablet 4   glucose blood (ONE TOUCH ULTRA TEST) test strip Use once daily to check blood sugar.  DXE11.9 50 each 6   lisinopril-hydrochlorothiazide (ZESTORETIC) 20-12.5 MG tablet Take 1 tablet by mouth daily. 90 tablet 3   omeprazole (PRILOSEC) 20 MG capsule TAKE 1 CAPSULE DAILY 90 capsule 3   ONETOUCH DELICA LANCETS 25O MISC Use once daily to check blood sugar.  DX E11.9 100 each 6   rasagiline (AZILECT) 1 MG TABS tablet Take 1 tablet (1 mg total) by mouth daily. 90 tablet 3   simvastatin (ZOCOR) 40 MG tablet Take 1 tablet (40 mg total) by mouth at bedtime. 90 tablet 3   TURMERIC CURCUMIN PO Take 1 tablet by mouth daily.      Allergies Allergies  Allergen Reactions   Penicillins     REACTION: rash and itching    Family History Family History  Problem Relation Age of Onset   Diabetes Mother    Dementia Mother    Dementia Father    Diabetes Sister    Healthy Son        x2   Healthy Daughter        x1   Colon cancer Neg Hx     Review of Systems: Constitutional:  no fevers Eye:  no recent significant change in vision Ears:  No changes in hearing Nose/Mouth/Throat:  no complaints of nasal congestion, no sore throat Cardiovascular: no chest pain Respiratory:  No shortness of breath Gastrointestinal:  No change in bowel habits GU:  No frequency or pain  integumentary:  no abnormal skin lesions reported Neurologic:  no headaches Endocrine:  denies unexplained weight changes  Exam BP (!) 144/84 (BP Location: Left Arm, Patient Position: Sitting, Cuff Size: Normal)    Pulse 80    Temp 98.1 F (36.7 C) (Oral)    Resp 18    Ht 6' 1" (1.854 m)    Wt 221 lb 9.6 oz (100.5 kg)    SpO2 98%    BMI 29.24 kg/m  General:  well developed, well nourished, in no apparent distress Skin:  no significant moles, warts, or growths Head:  no masses, lesions, or tenderness Eyes:  pupils equal and round, sclera anicteric without injection Ears:  canals without lesions,  TMs shiny without retraction, no obvious effusion, no erythema Nose:  nares patent, septum midline, mucosa normal Throat/Pharynx:  lips and gingiva without lesion; tongue and uvula midline; non-inflamed pharynx; no exudates or postnasal drainage Lungs:  clear to auscultation, breath sounds equal bilaterally, no respiratory distress Cardio:  regular rate and rhythm, no LE edema or bruits Rectal: Deferred GI: BS+, S, NT, ND, no masses or organomegaly Musculoskeletal:  symmetrical muscle groups noted without atrophy or deformity Neuro:  gait normal; deep tendon reflexes normal and symmetric Psych: well oriented with normal range of affect and appropriate judgment/insight  Assessment and Plan  Encounter for well adult exam with abnormal findings  Urinary urgency  Depression screening negative  Type 2 diabetes mellitus with hyperglycemia, without long-term current use of insulin (HCC) - Plan: Hemoglobin A1c, Lipid panel, Comprehensive metabolic panel, Microalbumin / creatinine urine ratio  At low risk for fall   Well 80 y.o. male. Counseled on diet and exercise. Other orders as above. Start Flomax for urgency. Warned about lightheadedness.  Follow up in 1 mo to reck urination.   The patient voiced understanding and agreement to the plan.  Albers, DO 02/28/21 10:02 AM

## 2021-02-28 NOTE — Patient Instructions (Signed)
Give [place] 2-3 business days to get the results of your labs back.   Keep the diet clean and stay active.  The new Shingrix vaccine (for shingles) is a 2 shot series. It can make people feel low energy, achy and almost like they have the flu for 48 hours after injection. Please plan accordingly when deciding on when to get this shot. Call the pharmacy to get this. The second shot of the series is less severe regarding the side effects, but it still lasts 48 hours.   Let us know if you need anything.

## 2021-03-07 ENCOUNTER — Telehealth: Payer: Self-pay | Admitting: *Deleted

## 2021-03-07 NOTE — Telephone Encounter (Signed)
Patient called about lab results.  Results given.  Patient wanted to let you know he stopped the Flomax.  He started to have nightmares and joint pain.  He will discuss this with you at next visit.

## 2021-04-27 ENCOUNTER — Ambulatory Visit: Payer: Medicare HMO | Admitting: Neurology

## 2021-04-27 ENCOUNTER — Other Ambulatory Visit: Payer: Self-pay

## 2021-04-27 ENCOUNTER — Encounter: Payer: Self-pay | Admitting: Neurology

## 2021-04-27 VITALS — BP 137/84 | HR 85 | Ht 73.0 in | Wt 220.5 lb

## 2021-04-27 DIAGNOSIS — G20 Parkinson's disease: Secondary | ICD-10-CM

## 2021-04-27 MED ORDER — CARBIDOPA-LEVODOPA 25-100 MG PO TABS: 1.5000 | ORAL_TABLET | Freq: Three times a day (TID) | ORAL | 4 refills | Status: DC

## 2021-04-27 NOTE — Progress Notes (Signed)
HISTORY OF PRESENT ILLNESS: Howard Gastineau Woodyis a 80 year old male, seen in refer by his primary care doctor Shelda Pal, for evaluation of tremor, involving his right arm and right hand, initial evaluation was onOctober 16 2018.  I have reviewed and summarized referring note, he has history of hypertension, diabetes, hyperlipidemia,  Since May of 2018, he noticed gradual onset right hand tremor, most noticeable when he is resting his right arm, he denies significant weakness, no left hand involvement,  He had a history of right shoulder injury in 2016 after he fell and landed on his right shoulder.  He denies loss sense of smell, he denies REM sleep disorder, no dizziness, chronic constipation, no family history of Parkinson's disease, he denies gait abnormality.  MRI of the brain November 2017 at the [facility] showed atrophy and small vessel disease no acute abnormality.  Laboratory evaluation in August 2018, normal magnesium, CBC, lipid profile, A1c was 6.1, normal TSH,  Update January 15, 2018. 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 He started to take Azilect 1 mg daily since October, getting prescription from [place], he denies significant side effect, no significant improvement  UPDATE July 15 2018:YY He is accompanied by his wife at today's clinical visit, he is taking Sinemet 25/100 mg 3 times a day at9, 2, 8 PM, no significant side effect noticed, there was no wearing off noticed, he has mild limitation mainly due to pain in his shoulders, no significant gait abnormality, He has a lot of stress due to recent move, has been moody  UPDATEJuly 28, 2020, He is with his wife, his Parkinson's disease is overall under good control, getting Azilect 1 mg daily from taking Sinemet 25/100 mg 3 times a day he  ambulated without difficulty  UPDATE Apr 27 2021: He is accompanied by his wife at today's clinical visit, he functions very well, denies difficulty walking, only intermittent hand tremor, he is still getting Azilect 1 mg daily, Sinemet 25/100 mg 1 in the morning, 1 and half at noon, 1 tablet at nighttime, he denies significant side effect, no wearing off,   REVIEW OF SYSTEMS: Out of a complete 14 system review of symptoms, the patient complains only of the following symptoms, and all other reviewed systems are negative.  Tremor  ALLERGIES: Allergies  Allergen Reactions  . Penicillins     REACTION: rash and itching    HOME MEDICATIONS: Outpatient Medications Prior to Visit  Medication Sig Dispense Refill  . Blood Glucose Monitoring Suppl (ONE TOUCH ULTRA 2) w/Device KIT Use once daily to check blood sugar.  DX E11.9 1 each 0  . carbidopa-levodopa (SINEMET IR) 25-100 MG tablet Take 1.5 tablets by mouth 3 (three) times daily. 400 tablet 4  . glucose blood (ONE TOUCH ULTRA TEST) test strip Use once daily to check blood sugar.  DXE11.9 50 each 6  . lisinopril-hydrochlorothiazide (ZESTORETIC) 20-12.5 MG tablet Take 1 tablet by mouth daily. 90 tablet 3  . omeprazole (PRILOSEC) 20 MG capsule Take 1 capsule (20 mg total) by mouth daily. 90 capsule 3  . ONETOUCH DELICA LANCETS 25Q MISC Use once daily to check blood sugar.  DX E11.9 100 each 6  . rasagiline (AZILECT) 1 MG TABS tablet Take 1 tablet (1 mg total) by mouth daily. 90 tablet 3  . simvastatin (ZOCOR) 40 MG tablet Take 1 tablet (40 mg  total) by mouth at bedtime. 90 tablet 3  . tamsulosin (FLOMAX) 0.4 MG CAPS capsule Take 1 capsule (0.4 mg total) by mouth daily. 30 capsule 1  . TURMERIC CURCUMIN PO Take 1 tablet by mouth daily.     No facility-administered medications prior to visit.    PAST MEDICAL HISTORY: Past Medical History:  Diagnosis Date  . Allergic rhinitis   . 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 Diabetes mellitus  without complication ([place])   . GERD (gastroesophageal reflux disease)   . Hiatal hernia   . History of stomach ulcers    1985  . Hypercholesteremia   . Hypertension   . Melanoma ([place]) 2014   shoulder  . Parkinson disease ([place])   . Peyronie's disease     PAST SURGICAL HISTORY: Past Surgical History:  Procedure Laterality Date  . basal cell removed from face  12/2012  . CATARACT EXTRACTION Bilateral 11/2009   Dr. Charise Killian  . CHOLECYSTECTOMY  02/1993   Dr. Harlow Asa  . right hernia repair  2002   Dr. Deon Pilling  . skin cancer removed from right shoulder  11/2013   melanoma    FAMILY HISTORY: Family History  Problem Relation Age of Onset  . Diabetes Mother   . Dementia Mother   . Dementia Father   . Diabetes Sister   . Healthy Son        x2  . Healthy Daughter        x1  . Colon cancer Neg Hx     SOCIAL HISTORY: Social History   Socioeconomic History  . Marital status: Married    Spouse name: [name]  . Number of children: 3  . Years of education: One year college  . Highest education level: Not on file  Occupational History  . Occupation: retired Nurse, mental health  . Smoking status: Never Smoker  . Smokeless tobacco: Never Used  Vaping Use  . Vaping Use: Never used  Substance and Sexual Activity  . Alcohol use: Yes    Alcohol/week: 1.0 standard drink    Types: 1 Glasses of wine per week    Comment: 3 drinks per week  . Drug use: No  . Sexual activity: Not on file  Other Topics Concern  . Not on file  Social History Narrative   Lives at home with his wife.   Right-handed.   2 cups coffee in the morning.   Social Determinants of Health   Financial Resource Strain: Not on file  Food Insecurity: Not on file  Transportation Needs: Not on file  Physical Activity: Not on file  Stress: Not on file  Social Connections: Not on file  Intimate Partner Violence: Not on file    PHYSICAL EXAM  Vitals:   04/27/21 1125  BP: 137/84  Pulse: 85  Weight: 220 lb 8  oz (100 kg)  Height: $Remove'6\' 1"'UexhhVk$  (1.854 m)   Body mass index is 29.09 kg/m.  Generalized: Well developed, in no acute distress  Neurological examination  Mentation: Alert oriented to time, place, history taking. Follows all commands speech and language fluent Cranial nerve II-XII: Pupils were equal round reactive to light. Extraocular movements were full, visual field were full on confrontational test. Facial sensation and strength were normal. Head turning and shoulder shrug were normal and symmetric. Motor: Normal strength, mild right more than left rigidity, no significant resting tremor Sensory: Sensory testing is intact to soft touch on all 4 extremities. No evidence of extinction  is noted.  Coordination: Cerebellar testing reveals good finger-nose-finger and heel-to-shin bilaterally.  No tremor seen. Gait and station: Good pace, good strides, slight decreased arm swing on the right, resting tremor, good turns, overall steady. Reflexes: Deep tendon reflexes are symmetric and normal bilaterally.   DIAGNOSTIC DATA (LABS, IMAGING, TESTING) - I reviewed patient records, labs, notes, testing and imaging myself where available.  Lab Results  Component Value Date   WBC 6.9 02/07/2018   HGB 15.9 02/07/2018   HCT 45.4 02/07/2018   MCV 94.2 02/07/2018   PLT 204.0 02/07/2018      Component Value Date/Time   NA 141 02/28/2021 1001   K 3.9 02/28/2021 1001   CL 101 02/28/2021 1001   CO2 31 02/28/2021 1001   GLUCOSE 144 (H) 02/28/2021 1001   BUN 11 02/28/2021 1001   CREATININE 1.12 02/28/2021 1001   CREATININE 1.03 04/17/2014 1026   CALCIUM 9.7 02/28/2021 1001   PROT 7.3 02/28/2021 1001   ALBUMIN 4.6 02/28/2021 1001   AST 29 02/28/2021 1001   ALT 7 02/28/2021 1001   ALKPHOS 60 02/28/2021 1001   BILITOT 0.9 02/28/2021 1001   GFRNONAA 70.39 12/30/2009 1014   GFRAA 86 12/17/2008 0946   Lab Results  Component Value Date   CHOL 157 02/28/2021   HDL 43.00 02/28/2021   LDLCALC 79  02/28/2021   LDLDIRECT 94.6 01/15/2014   TRIG 179.0 (H) 02/28/2021   CHOLHDL 4 02/28/2021   Lab Results  Component Value Date   HGBA1C 6.7 (H) 02/28/2021   No results found for: VITAMINB12 Lab Results  Component Value Date   TSH 4.36 09/27/2016   ASSESSMENT AND PLAN 80 y.o. year old male   Idiopathic Parkinson's disease  Continue Sinemet 25/100 mg 3 times a day  Continue Azilect 1 mg daily  Continue moderate exercise,  Return to clinic in 6 months  Marcial Pacas, M.D. Ph.D.  [facility] [place], [place] [address] Phone: [phone] Fax:      [phone]

## 2021-06-13 ENCOUNTER — Other Ambulatory Visit: Payer: Self-pay

## 2021-06-13 ENCOUNTER — Ambulatory Visit (INDEPENDENT_AMBULATORY_CARE_PROVIDER_SITE_OTHER): Payer: Medicare HMO | Admitting: Family Medicine

## 2021-06-13 ENCOUNTER — Encounter: Payer: Self-pay | Admitting: Family Medicine

## 2021-06-13 VITALS — BP 130/78 | HR 77 | Temp 97.5°F | Ht 73.0 in | Wt 225.4 lb

## 2021-06-13 DIAGNOSIS — R079 Chest pain, unspecified: Secondary | ICD-10-CM

## 2021-06-13 NOTE — Patient Instructions (Signed)
Ice/cold pack over area for 10-15 min twice daily.  Heat (pad or rice pillow in microwave) over affected area, 10-15 minutes twice daily.   OK to take Tylenol 1000 mg (2 extra strength tabs) or 975 mg (3 regular strength tabs) every 6 hours as needed.  Let us know if you need anything.  

## 2021-06-13 NOTE — Progress Notes (Signed)
Musculoskeletal Exam  Patient: Howard Fisher DOB: [birth year]  DOS: 06/13/2021  SUBJECTIVE:  Chief Complaint:   Chief Complaint  Patient presents with   rib pain    Howard Fisher is a 80 y.o.  male for evaluation and treatment of rib pain.   Onset:  2 weeks ago. Leaned over and felt a pop.  Location: L side of rib cage just below nipple Character:  dull  4/10 severity. Progression of issue:  is unchanged Associated symptoms: No bruising, redness, or swelling Treatment: to date has been acetaminophen and wrapping it.   Neurovascular symptoms: no  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   Diabetes mellitus without complication (HCC)    GERD (gastroesophageal reflux disease)    Hiatal hernia    History of stomach ulcers    1985   Hypercholesteremia    Hypertension    Melanoma ([place]) 2014   shoulder   Parkinson disease ([place])    Peyronie's disease     Objective: VITAL SIGNS: BP 130/78   Pulse 77   Temp (!) 97.5 F (36.4 C) (Oral)   Ht 6\' 1"  (1.854 m)   Wt 225 lb 6 oz (102.2 kg)   SpO2 98%   BMI 29.73 kg/m  Constitutional: Well formed, well developed. No acute distress. Thorax & Lungs: CTAB. No accessory muscle use Musculoskeletal: L side.   Tenderness to palpation: yes over the ant anx line along R 9-10 on L Deformity: no Ecchymosis: no No crepitus.  Neurologic: Normal sensory function.  Psychiatric: Normal mood. Age appropriate judgment and insight. Alert & oriented x 3.    Assessment:  Left-sided chest pain  Plan: Stretch the area, should be self limiting, heat, ice, Tylenol. PT if no better.  F/u as originally scheduled. The patient voiced understanding and agreement to the plan.   [place], DO 06/13/21  3:31 PM

## 2021-07-11 ENCOUNTER — Telehealth: Payer: Self-pay | Admitting: Neurology

## 2021-07-11 MED ORDER — RASAGILINE MESYLATE 1 MG PO TABS: 1.0000 mg | ORAL_TABLET | Freq: Every day | ORAL | 3 refills | Status: DC

## 2021-07-11 NOTE — Telephone Encounter (Signed)
Pt's wife called requesting husbands refill for rasagiline (AZILECT) 1 MG TABS tablet. Pharmacy [facility]  [phone].

## 2021-07-11 NOTE — Telephone Encounter (Signed)
Refills sent to requested pharmacy. 

## 2021-07-11 NOTE — Telephone Encounter (Signed)
Pt's wife, Garlan Drewes called, wanted to make sure rasagiline (AZILECT) 1 MG TABS tablet was sent to the correct pharmacy. Need to be sent to [facility].

## 2021-07-11 NOTE — Telephone Encounter (Signed)
Refills sent to [facility] voided. Rx sent to requested mail order pharmacy.

## 2021-07-14 NOTE — Telephone Encounter (Signed)
Pt's wife is asking for a call to discuss the invoice that came with the mail order rasagiline (AZILECT) 1 MG TABS tablet it was for $140.00 and this is an amount they are unable to afford, please call.

## 2021-07-14 NOTE — Telephone Encounter (Signed)
LVM for patient to return call to office.

## 2021-07-14 NOTE — Telephone Encounter (Signed)
Attempted to call pt, LVM for call back  °

## 2021-07-15 NOTE — Telephone Encounter (Signed)
Pt's wife called back and is wanting to speak to the RN Monday. Please advise.

## 2021-07-18 DIAGNOSIS — Z86008 Personal history of in-situ neoplasm of other site: Secondary | ICD-10-CM | POA: Diagnosis not present

## 2021-07-18 DIAGNOSIS — L820 Inflamed seborrheic keratosis: Secondary | ICD-10-CM | POA: Diagnosis not present

## 2021-07-18 DIAGNOSIS — L570 Actinic keratosis: Secondary | ICD-10-CM | POA: Diagnosis not present

## 2021-07-18 DIAGNOSIS — Z129 Encounter for screening for malignant neoplasm, site unspecified: Secondary | ICD-10-CM | POA: Diagnosis not present

## 2021-07-18 DIAGNOSIS — Z85820 Personal history of malignant melanoma of skin: Secondary | ICD-10-CM | POA: Diagnosis not present

## 2021-07-18 NOTE — Telephone Encounter (Signed)
The tier exception has been approved through 12/24/2021.

## 2021-07-18 NOTE — Telephone Encounter (Signed)
Per review of his chart, he has been approved for a tier exception in the past.   New case has been started on covermymeds ([id number]). Pt has pharmacy coverage through [employer] (ID#[id number]). Decsion pending.  I spoke to his wife and provided her with this update.

## 2021-07-26 NOTE — Telephone Encounter (Signed)
Pt's wife, Nevin Willenborg (on [place]) called, would like a call from the nurse about change in tier, how long going to last. Received a letter from [employer].

## 2021-07-26 NOTE — Telephone Encounter (Signed)
Returned call to patient's wife, Jana Half.    Concerned about letter from [employer].  She just wanted to let us know she is going to call us back in November to get the ball rolling on the tier exception for 2023 for the Azilect.    Patient denied further questions, verbalized understanding and expressed appreciation for the phone call.

## 2021-08-31 DIAGNOSIS — Z85820 Personal history of malignant melanoma of skin: Secondary | ICD-10-CM | POA: Diagnosis not present

## 2021-08-31 DIAGNOSIS — Z7722 Contact with and (suspected) exposure to environmental tobacco smoke (acute) (chronic): Secondary | ICD-10-CM | POA: Diagnosis not present

## 2021-08-31 DIAGNOSIS — I10 Essential (primary) hypertension: Secondary | ICD-10-CM | POA: Diagnosis not present

## 2021-08-31 DIAGNOSIS — Z7982 Long term (current) use of aspirin: Secondary | ICD-10-CM | POA: Diagnosis not present

## 2021-08-31 DIAGNOSIS — G20 Parkinson's disease: Secondary | ICD-10-CM | POA: Diagnosis not present

## 2021-08-31 DIAGNOSIS — K219 Gastro-esophageal reflux disease without esophagitis: Secondary | ICD-10-CM | POA: Diagnosis not present

## 2021-08-31 DIAGNOSIS — E785 Hyperlipidemia, unspecified: Secondary | ICD-10-CM | POA: Diagnosis not present

## 2021-08-31 DIAGNOSIS — E119 Type 2 diabetes mellitus without complications: Secondary | ICD-10-CM | POA: Diagnosis not present

## 2021-08-31 DIAGNOSIS — Z880 Allergy status to penicillin: Secondary | ICD-10-CM | POA: Diagnosis not present

## 2021-09-02 DIAGNOSIS — H16223 Keratoconjunctivitis sicca, not specified as Sjogren's, bilateral: Secondary | ICD-10-CM | POA: Diagnosis not present

## 2021-09-02 DIAGNOSIS — H43813 Vitreous degeneration, bilateral: Secondary | ICD-10-CM | POA: Diagnosis not present

## 2021-09-02 DIAGNOSIS — H52203 Unspecified astigmatism, bilateral: Secondary | ICD-10-CM | POA: Diagnosis not present

## 2021-09-02 DIAGNOSIS — Z7984 Long term (current) use of oral hypoglycemic drugs: Secondary | ICD-10-CM | POA: Diagnosis not present

## 2021-09-02 DIAGNOSIS — H02135 Senile ectropion of left lower eyelid: Secondary | ICD-10-CM | POA: Diagnosis not present

## 2021-09-02 DIAGNOSIS — H524 Presbyopia: Secondary | ICD-10-CM | POA: Diagnosis not present

## 2021-09-02 DIAGNOSIS — D3132 Benign neoplasm of left choroid: Secondary | ICD-10-CM | POA: Diagnosis not present

## 2021-09-02 DIAGNOSIS — Z961 Presence of intraocular lens: Secondary | ICD-10-CM | POA: Diagnosis not present

## 2021-09-02 DIAGNOSIS — E119 Type 2 diabetes mellitus without complications: Secondary | ICD-10-CM | POA: Diagnosis not present

## 2021-09-02 DIAGNOSIS — H02132 Senile ectropion of right lower eyelid: Secondary | ICD-10-CM | POA: Diagnosis not present

## 2021-09-19 ENCOUNTER — Encounter (HOSPITAL_BASED_OUTPATIENT_CLINIC_OR_DEPARTMENT_OTHER): Payer: Self-pay | Admitting: Urology

## 2021-09-19 ENCOUNTER — Other Ambulatory Visit: Payer: Self-pay

## 2021-09-19 ENCOUNTER — Emergency Department (HOSPITAL_BASED_OUTPATIENT_CLINIC_OR_DEPARTMENT_OTHER)
Admission: EM | Admit: 2021-09-19 | Discharge: 2021-09-19 | Disposition: A | Discharge status: Home / Self Care | Payer: Medicare HMO | Attending: Emergency Medicine | Admitting: Emergency Medicine

## 2021-09-19 ENCOUNTER — Emergency Department (HOSPITAL_BASED_OUTPATIENT_CLINIC_OR_DEPARTMENT_OTHER): Payer: Medicare HMO

## 2021-09-19 DIAGNOSIS — E119 Type 2 diabetes mellitus without complications: Secondary | ICD-10-CM | POA: Insufficient documentation

## 2021-09-19 DIAGNOSIS — R509 Fever, unspecified: Secondary | ICD-10-CM | POA: Diagnosis not present

## 2021-09-19 DIAGNOSIS — G20 Parkinson's disease: Secondary | ICD-10-CM | POA: Insufficient documentation

## 2021-09-19 DIAGNOSIS — U071 COVID-19: Secondary | ICD-10-CM | POA: Diagnosis not present

## 2021-09-19 DIAGNOSIS — R059 Cough, unspecified: Secondary | ICD-10-CM | POA: Diagnosis not present

## 2021-09-19 DIAGNOSIS — Z85828 Personal history of other malignant neoplasm of skin: Secondary | ICD-10-CM | POA: Insufficient documentation

## 2021-09-19 DIAGNOSIS — I10 Essential (primary) hypertension: Secondary | ICD-10-CM | POA: Insufficient documentation

## 2021-09-19 LAB — BASIC METABOLIC PANEL
Anion gap: 10 (ref 5–15)
BUN: 22 mg/dL (ref 8–23)
CO2: 28 mmol/L (ref 22–32)
Calcium: 8.7 mg/dL — ABNORMAL LOW (ref 8.9–10.3)
Chloride: 97 mmol/L — ABNORMAL LOW (ref 98–111)
Creatinine, Ser: 1.26 mg/dL — ABNORMAL HIGH (ref 0.61–1.24)
GFR, Estimated: 57 mL/min — ABNORMAL LOW (ref >60)
Glucose, Bld: 145 mg/dL — ABNORMAL HIGH (ref 70–99)
Potassium: 3.5 mmol/L (ref 3.5–5.1)
Sodium: 135 mmol/L (ref 135–145)

## 2021-09-19 LAB — RESP PANEL BY RT-PCR (FLU A&B, COVID) ARPGX2
Influenza A by PCR: NEGATIVE
Influenza B by PCR: NEGATIVE
SARS Coronavirus 2 by RT PCR: POSITIVE — AB

## 2021-09-19 MED ORDER — ALBUTEROL SULFATE HFA 108 (90 BASE) MCG/ACT IN AERS: 1.0000 | INHALATION_SPRAY | Freq: Four times a day (QID) | RESPIRATORY_TRACT | 0 refills | Status: DC | PRN

## 2021-09-19 MED ORDER — NIRMATRELVIR/RITONAVIR (PAXLOVID) TABLET (RENAL DOSING): 2.0000 | ORAL_TABLET | Freq: Two times a day (BID) | ORAL | 0 refills | Status: AC

## 2021-09-19 NOTE — ED Provider Notes (Signed)
Emergency Department Provider Note   I have reviewed the triage vital signs and the nursing notes.   HISTORY  Chief Complaint Cough and Sore Throat   HPI Howard Fisher is a 80 y.o. male with PMH reviewed below presents to the ED with cough and URI symptoms. Has had some subjective fever but denies any SOB or CP. Wife has similar symptoms. PO intake has been less than normal. Feeling some generalized fatigue but able to be up and ambulatory. Patient adequately performing ADLs. No sick contacts. No radiation of symptoms or modifying factors.    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   Diabetes mellitus without complication ([place])    GERD (gastroesophageal reflux disease)    Hiatal hernia    History of stomach ulcers    1985   Hypercholesteremia    Hypertension    Melanoma ([name]) 2014   shoulder   Parkinson disease ([name])    Peyronie's disease     Patient Active Problem List   Diagnosis Date Noted   Urinary urgency 02/28/2021   Idiopathic Parkinson's disease ([name]) 07/15/2018   Well adult exam 02/07/2018   Refused influenza vaccine 02/07/2018   Parkinson disease ([name])    Parkinson's disease ([name]) 01/15/2018   Tremor 10/09/2017   Right-sided low back pain without sciatica 02/29/2016   Right shoulder injury 08/10/2015   Chronic right shoulder pain 08/02/2015   Type 2 diabetes mellitus with hyperglycemia, without [name]-term current use of insulin ([place]) 09/01/2014   Colon cancer screening 04/21/2014   Dyspnea 06/12/2013   Overweight(278.02) 01/02/2013   Essential hypertension 03/25/2009   HIATAL HERNIA 12/17/2008   PEYRONIE'S DISEASE 12/17/2008   HYPERCHOLESTEROLEMIA 12/16/2008   GERD 12/16/2008   BACK PAIN, LUMBAR 12/16/2008    Past Surgical History:  Procedure Laterality Date   basal cell removed from face  12/2012   CATARACT EXTRACTION Bilateral 11/2009   Dr. Charise Killian   CHOLECYSTECTOMY  02/1993   Dr.  Harlow Asa   right hernia repair  2002   Dr. Deon Pilling   skin cancer removed from right shoulder  11/2013   melanoma    Allergies Penicillins  Family History  Problem Relation Age of Onset   Diabetes Mother    Dementia Mother    Dementia Father    Diabetes Sister    Healthy Son        x2   Healthy Daughter        x1   Colon cancer Neg Hx     Social History Social History   Tobacco Use   Smoking status: Never   Smokeless tobacco: Never  Vaping Use   Vaping Use: Never used  Substance Use Topics   Alcohol use: Yes    Alcohol/week: 1.0 standard drink    Types: 1 Glasses of wine per week    Comment: 3 drinks per week   Drug use: No    Review of Systems  Constitutional: Subjective fever/chills Eyes: No visual changes. ENT: Mild sore throat. Cardiovascular: Denies chest pain. Respiratory: Denies shortness of breath. Positive cough.  Gastrointestinal: No abdominal pain.  No nausea, no vomiting.  No diarrhea.  No constipation. Genitourinary: Negative for dysuria. Musculoskeletal: Negative for back pain. Positive diffuse body aches.  Skin: Negative for rash. Neurological: Negative for focal weakness or numbness. Positive HA.   10-point ROS otherwise negative.  ____________________________________________   PHYSICAL EXAM:  VITAL SIGNS: ED Triage Vitals  Enc Vitals Group     BP 09/19/21 1014 121/71     Pulse Rate 09/19/21 1014 (!) 104     Resp 09/19/21 1014 18     Temp 09/19/21 1014 98.7 F (37.1 C)     Temp Source 09/19/21 1014 Oral     SpO2 09/19/21 1014 93 %     Weight 09/19/21 1011 220 lb (99.8 kg)     Height 09/19/21 1011 6\' 1"  (1.854 m)   Constitutional: Alert and oriented. Well appearing and in no acute distress. Eyes: Conjunctivae are normal.  Head: Atraumatic. Nose: Positive congestion/rhinnorhea. Mouth/Throat: Mucous membranes are moist.  Oropharynx non-erythematous. Neck: No stridor.  Cardiovascular: Normal rate, regular rhythm. Good peripheral  circulation. Grossly normal heart sounds.   Respiratory: Normal respiratory effort.  No retractions. Lungs CTAB. Occasional cough.  Gastrointestinal: Soft and nontender. No distention.  Musculoskeletal: No lower extremity tenderness nor edema. No gross deformities of extremities. Neurologic:  Normal speech and language. No gross focal neurologic deficits are appreciated.  Skin:  Skin is warm, dry and intact. No rash noted.   ____________________________________________   LABS (all labs ordered are listed, but only abnormal results are displayed)  Labs Reviewed  RESP PANEL BY RT-PCR (FLU A&B, COVID) ARPGX2 - Abnormal; Notable for the following components:      Result Value   SARS Coronavirus 2 by RT PCR POSITIVE (*)    All other components within normal limits  BASIC METABOLIC PANEL - Abnormal; Notable for the following components:   Chloride 97 (*)    Glucose, Bld 145 (*)    Creatinine, Ser 1.26 (*)    Calcium 8.7 (*)    GFR, Estimated 57 (*)    All other components within normal limits    ____________________________________________  RADIOLOGY  DG Chest Portable 1 View  Result Date: 09/19/2021 CLINICAL DATA:  cough and fever EXAM: PORTABLE CHEST 1 VIEW COMPARISON:  June 12, 2013. FINDINGS: Similar cardiomediastinal silhouette. Tortuous aorta. Both lungs are clear. No visible pleural effusions or pneumothorax. No acute osseous abnormality. IMPRESSION: No evidence of acute cardiopulmonary disease. Electronically Signed   By: Margaretha Sheffield M.D.   On: 09/19/2021 11:39    ____________________________________________   PROCEDURES  Procedure(s) performed:   Procedures  None ____________________________________________   INITIAL IMPRESSION / ASSESSMENT AND PLAN / ED COURSE  Pertinent labs & imaging results that were available during my care of the patient were reviewed by me and considered in my medical decision making (see chart for details).  Patient presents to the  ED with URI symptoms. CXR does not show evidence of focal infiltrate to suspect CAP. COVID test is positive here. Labs show slight renal insufficiency. Discussed risk factors for worsening COVID and patient would like to proceed with Paxlovid. Will send home with renal dosing of this medication. Discussed supportive care and ED return precautions along with quarantine.     ____________________________________________  FINAL CLINICAL IMPRESSION(S) / ED DIAGNOSES  Final diagnoses:  COVID-19     NEW OUTPATIENT MEDICATIONS STARTED DURING THIS VISIT:  Discharge Medication List as of 09/19/2021 11:45 AM     START taking these medications   Details  albuterol (VENTOLIN HFA) 108 (90 Base) MCG/ACT inhaler Inhale 1-2 puffs into the lungs every 6 (six) hours as needed for wheezing or shortness of breath., Starting Mon 09/19/2021, Normal    nirmatrelvir/ritonavir EUA, renal dosing, (PAXLOVID) 10 x 150 MG & 10 x 100MG  TABS Take 2 tablets by mouth 2 (two) times daily for  5 days. Patient GFR is 57. Take nirmatrelvir (150 mg) one tablet twice daily for 5 days and ritonavir (100 mg) one tablet twice daily for 5 days., Starting Mon 09/19/2021, Until Sat 09/24/2021, Normal        Note:  This document was prepared using Dragon voice recognition software and may include unintentional dictation errors.  Nanda Quinton, MD, [facility] Emergency Medicine    Znya Albino, [facility], MD 09/23/21 1051

## 2021-09-19 NOTE — Discharge Instructions (Addendum)
You were seen in the emergency room today with COVID-like symptoms.  Your COVID test is positive but your x-ray is not showing pneumonia.  I am starting you on an antiviral medication for the next 5 days.  While you are taking this medicine, please stop taking your Zocor.  You can restart this medication after you have completed your antiviral medicines.  If develop any new or suddenly worsening symptoms such as chest pain or shortness of breath, confusion, you should return to the emergency department for reevaluation.

## 2021-09-19 NOTE — ED Triage Notes (Signed)
Cough, runny nose, sore throat since Saturday. Denies fever.

## 2021-10-17 ENCOUNTER — Telehealth: Payer: Self-pay | Admitting: Neurology

## 2021-10-17 NOTE — Telephone Encounter (Signed)
Completed tier exception for rasagiline via CMM. Sent to [employer]. Key: [id number]. Should have a determination within 3-5 business days.

## 2021-10-17 NOTE — Telephone Encounter (Signed)
Pt's wife, Milford Cilento (on [place]) would like a call from the nurse to discuss getting a new tier for his rasagiline (AZILECT) 1 MG TABS tablet.

## 2021-10-18 NOTE — Telephone Encounter (Signed)
Pt's wife would like a call back when determination has been  made.

## 2021-10-18 NOTE — Telephone Encounter (Signed)
A determination still has not been made but it has been less than 24 hours since submitted. I will leave this in POD 2's calls for someone to follow up with the patient when a determination is made.

## 2021-10-19 NOTE — Telephone Encounter (Signed)
Received denial letter from [facility] for tier exception stating unable to do tier exception on non-formulary drug that was approved initially by [place].   I initiated a new case from MK,RN old key#. Key for new case: [id number]. Waiting on determination.

## 2021-10-20 NOTE — Telephone Encounter (Signed)
Pt's wife called have a question about the tier for his medication. Would like a call from [facility]

## 2021-10-20 NOTE — Telephone Encounter (Signed)
Called [facility] and spoke w/ rep. They received last refill 10/13/21, 0.00 copay. Too soon to run another test claim to see what cost would be at this point. I called wife back and relayed this info. She will wait until he is due next month for refill to see what cost will be. She will call our office if they have any further questions at that time.

## 2021-10-20 NOTE — Telephone Encounter (Signed)
Called wife back and relayed what letter stated. She will f/u with pharmacy at [phone] to have them re-run claim to see what cost will be. She will call back if she has any more issues w/ filling rx.  He has next f/u 02/02/22 at 11:15a w/ SS,NP.

## 2021-10-31 ENCOUNTER — Other Ambulatory Visit: Payer: Self-pay

## 2021-10-31 ENCOUNTER — Encounter: Payer: Self-pay | Admitting: Family Medicine

## 2021-10-31 ENCOUNTER — Ambulatory Visit: Payer: Medicare HMO | Admitting: Family Medicine

## 2021-10-31 ENCOUNTER — Ambulatory Visit (INDEPENDENT_AMBULATORY_CARE_PROVIDER_SITE_OTHER): Payer: Medicare HMO | Admitting: Family Medicine

## 2021-10-31 VITALS — BP 122/84 | HR 73 | Temp 99.1°F | Ht 73.0 in | Wt 217.2 lb

## 2021-10-31 DIAGNOSIS — E7800 Pure hypercholesterolemia, unspecified: Secondary | ICD-10-CM

## 2021-10-31 DIAGNOSIS — I10 Essential (primary) hypertension: Secondary | ICD-10-CM | POA: Diagnosis not present

## 2021-10-31 DIAGNOSIS — E1165 Type 2 diabetes mellitus with hyperglycemia: Secondary | ICD-10-CM | POA: Diagnosis not present

## 2021-10-31 NOTE — Patient Instructions (Signed)
Give [place] 2-3 business days to get the results of your labs back.   Keep the diet clean and stay active.  Wait 4 more weeks to get the updated covid booster vaccine and Shingrix.   Let us know if you need anything.

## 2021-10-31 NOTE — Progress Notes (Signed)
CC: Follow up   Subjective Howard Fisher is a 80 y.o. male who presents for hypertension follow up. He does monitor home blood pressures. Blood pressures ranging from 120's/70's on average. He is compliant with medications- Zestoretic 20-12.5 mg/d. Patient has these side effects of medication: none He is adhering to a healthy diet overall. Current exercise: walking, golfing No Cp or SOB.   DM II Does not ck sugars. Diet controlled. Diet/exercise as above. UTD w vaccines and maintenance.  Hyperlipidemia Patient presents for dyslipidemia follow up. Currently being treated with Zocor 20 mg/d and compliance with treatment thus far has been good. He denies myalgias. Diet/exercise as above.  The patient is not known to have coexisting coronary artery disease.   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   Diabetes mellitus without complication (HCC)    GERD (gastroesophageal reflux disease)    Hiatal hernia    History of stomach ulcers    1985   Hypercholesteremia    Hypertension    Melanoma ([place]) 2014   shoulder   Parkinson disease ([place])    Peyronie's disease     Exam BP 122/84   Pulse 73   Temp 99.1 F (37.3 C) (Oral)   Ht 6\' 1"  (1.854 m)   Wt 217 lb 4 oz (98.5 kg)   SpO2 97%   BMI 28.66 kg/m  General:  well developed, well nourished, in no apparent distress Heart: RRR, no bruits, no LE edema Lungs: clear to auscultation, no accessory muscle use Psych: well oriented with normal range of affect and appropriate judgment/insight  Essential hypertension  HYPERCHOLESTEROLEMIA  Type 2 diabetes mellitus with hyperglycemia, without long-term current use of insulin (HCC) - Plan: Comprehensive metabolic panel, Lipid panel, Hemoglobin A1c  Chronic, stable. Cont Zestoretic 20-12.5 mg/d. Counseled on diet and exercise. Chronic, stable. Cont Zocor 20 mg/d.  Chronic, stable. Cont diet control.  F/u in 6 mo for CPE  or prn. The patient voiced understanding and agreement to the plan.  [place], DO 10/31/21  3:35 PM

## 2021-11-01 ENCOUNTER — Other Ambulatory Visit: Payer: Self-pay | Admitting: Family Medicine

## 2021-11-01 DIAGNOSIS — E7800 Pure hypercholesterolemia, unspecified: Secondary | ICD-10-CM

## 2021-11-01 LAB — COMPREHENSIVE METABOLIC PANEL
ALT: 18 U/L (ref 0–53)
AST: 23 U/L (ref 0–37)
Albumin: 4.2 g/dL (ref 3.5–5.2)
Alkaline Phosphatase: 62 U/L (ref 39–117)
BUN: 13 mg/dL (ref 6–23)
CO2: 29 mEq/L (ref 19–32)
Calcium: 9.4 mg/dL (ref 8.4–10.5)
Chloride: 99 mEq/L (ref 96–112)
Creatinine, Ser: 0.84 mg/dL (ref 0.40–1.50)
GFR: 81.75 mL/min (ref >60.00)
Glucose, Bld: 118 mg/dL — ABNORMAL HIGH (ref 70–99)
Potassium: 4.1 mEq/L (ref 3.5–5.1)
Sodium: 137 mEq/L (ref 135–145)
Total Bilirubin: 0.6 mg/dL (ref 0.2–1.2)
Total Protein: 7 g/dL (ref 6.0–8.3)

## 2021-11-01 LAB — LIPID PANEL
Cholesterol: 140 mg/dL (ref 0–200)
HDL: 34.4 mg/dL — ABNORMAL LOW (ref >39.00)
NonHDL: 105.76
Total CHOL/HDL Ratio: 4
Triglycerides: 217 mg/dL — ABNORMAL HIGH (ref 0.0–149.0)
VLDL: 43.4 mg/dL — ABNORMAL HIGH (ref 0.0–40.0)

## 2021-11-01 LAB — HEMOGLOBIN A1C: Hgb A1c MFr Bld: 7.4 % — ABNORMAL HIGH (ref 4.6–6.5)

## 2021-11-01 LAB — LDL CHOLESTEROL, DIRECT: Direct LDL: 83 mg/dL

## 2021-11-14 ENCOUNTER — Other Ambulatory Visit: Payer: Self-pay | Admitting: Family Medicine

## 2021-11-14 MED ORDER — OMEPRAZOLE 20 MG PO CPDR: 20.0000 mg | DELAYED_RELEASE_CAPSULE | Freq: Every day | ORAL | 3 refills | Status: DC

## 2021-11-15 ENCOUNTER — Other Ambulatory Visit (INDEPENDENT_AMBULATORY_CARE_PROVIDER_SITE_OTHER): Payer: Medicare HMO

## 2021-11-15 ENCOUNTER — Other Ambulatory Visit: Payer: Self-pay

## 2021-11-15 DIAGNOSIS — E7800 Pure hypercholesterolemia, unspecified: Secondary | ICD-10-CM | POA: Diagnosis not present

## 2021-11-15 LAB — LIPID PANEL
Cholesterol: 145 mg/dL (ref 0–200)
HDL: 34.4 mg/dL — ABNORMAL LOW (ref >39.00)
NonHDL: 110.58
Total CHOL/HDL Ratio: 4
Triglycerides: 222 mg/dL — ABNORMAL HIGH (ref 0.0–149.0)
VLDL: 44.4 mg/dL — ABNORMAL HIGH (ref 0.0–40.0)

## 2021-11-15 LAB — LDL CHOLESTEROL, DIRECT: Direct LDL: 84 mg/dL

## 2021-11-15 NOTE — Progress Notes (Signed)
Patient states he is not fasting he ate cheerios for breakfast.

## 2021-11-16 ENCOUNTER — Other Ambulatory Visit: Payer: Self-pay | Admitting: Family Medicine

## 2021-11-16 DIAGNOSIS — E7800 Pure hypercholesterolemia, unspecified: Secondary | ICD-10-CM

## 2021-11-16 MED ORDER — FENOFIBRATE 48 MG PO TABS: 48.0000 mg | ORAL_TABLET | Freq: Every day | ORAL | 3 refills | Status: DC

## 2021-11-16 NOTE — Progress Notes (Signed)
lipi

## 2021-11-29 NOTE — Progress Notes (Signed)
Subjective:   Howard Fisher is a 80 y.o. male who presents for Medicare Annual/Subsequent preventive examination.  I connected with Howard Fisher today by telephone and verified that I am speaking with the correct person using two identifiers. Location patient: home Location provider: work Persons participating in the virtual visit: patient, Marine scientist.    I discussed the limitations, risks, security and privacy concerns of performing an evaluation and management service by telephone and the availability of in person appointments. I also discussed with the patient that there may be a patient responsible charge related to this service. The patient expressed understanding and verbally consented to this telephonic visit.    Interactive audio and video telecommunications were attempted between this provider and patient, however failed, due to patient having technical difficulties OR patient did not have access to video capability.  We continued and completed visit with audio only.  Some vital signs may be absent or patient reported.   Time Spent with patient on telephone encounter: 30 minutes   Review of Systems     Cardiac Risk Factors include: advanced age (>2men, >84 women);hypertension;male gender;diabetes mellitus;dyslipidemia     Objective:    Today's Vitals   12/01/21 1021  Weight: 217 lb (98.4 kg)  Height: 6\' 1"  (1.854 m)   Body mass index is 28.63 kg/m.  Advanced Directives 12/01/2021 09/19/2021 06/08/2015 04/20/2015 06/10/2014  Does Patient Have a Medical Advance Directive? Yes No No Yes Patient has advance directive, copy not in chart  Type of Advance Directive [place];Living will - - [place];Living will Living will  Does patient want to make changes to medical advance directive? - - - No - Patient declined -  Copy of [place] in Chart? No - copy requested - - No - copy requested -  Would patient like information on creating  a medical advance directive? - - No - patient declined information - -    Current Medications (verified) Outpatient Encounter Medications as of 12/01/2021  Medication Sig   carbidopa-levodopa (SINEMET IR) 25-100 MG tablet Take 1.5 tablets by mouth 3 (three) times daily.   fenofibrate (TRICOR) 48 MG tablet Take 1 tablet (48 mg total) by mouth daily.   lisinopril-hydrochlorothiazide (ZESTORETIC) 20-12.5 MG tablet Take 1 tablet by mouth daily.   omeprazole (PRILOSEC) 20 MG capsule Take 1 capsule (20 mg total) by mouth daily.   rasagiline (AZILECT) 1 MG TABS tablet Take 1 tablet (1 mg total) by mouth daily.   simvastatin (ZOCOR) 40 MG tablet Take 1 tablet (40 mg total) by mouth at bedtime.   TURMERIC CURCUMIN PO Take 1 tablet by mouth daily.   No facility-administered encounter medications on file as of 12/01/2021.    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 Charise Killian   CHOLECYSTECTOMY  02/1993   Dr. Harlow Asa   right hernia repair  2002   Dr.  Bowman   skin cancer removed from right shoulder  11/2013   melanoma   Family History  Problem Relation Age of Onset   Diabetes Mother    Dementia Mother    Dementia Father    Diabetes Sister    Healthy Son        x2   Healthy Daughter        x1   Colon cancer Neg Hx    Social History   Socioeconomic History   Marital status: Married    Spouse name: Howard Fisher   Number of children: 3   Years of education: One year college   Highest  education level: Not on file  Occupational History   Occupation: retired Administrator  Tobacco Use   Smoking status: Never   Smokeless tobacco: Never  Vaping Use   Vaping Use: Never used  Substance and Sexual Activity   Alcohol use: Yes    Alcohol/week: 1.0 standard drink    Types: 1 Glasses of wine per week    Comment: 3 drinks per week   Drug use: No   Sexual activity: Not on file  Other Topics Concern   Not on file  Social History Narrative   Lives at home with his wife.   Right-handed.   2 cups coffee in the morning.   Social Determinants of Health   Financial Resource Strain: Low Risk    Difficulty of Paying Living Expenses: Not hard at all  Food Insecurity: No Food Insecurity   Worried About Charity fundraiser in the Last Year: Never true   [place] in the Last Year: Never true  Transportation Needs: No Transportation Needs   Lack of Transportation (Medical): No   Lack of Transportation (Non-Medical): No  Physical Activity: Insufficiently Active   Days of Exercise per Week: 3 days   Minutes of Exercise per Session: 30 min  Stress: No Stress Concern Present   Feeling of Stress : Not at all  Social Connections: Moderately Isolated   Frequency of Communication with Friends and Family: More than three times a week   Frequency of Social Gatherings with Friends and Family: More than three times a week   Attends Religious Services: Never   Marine scientist or Organizations: No   Attends Music therapist: Never   Marital Status: Married    Tobacco Counseling Counseling given: Not Answered   Clinical Intake:  Pre-visit preparation completed: Yes  Pain : No/denies pain     BMI - recorded: 28.63 Nutritional Status: BMI 25 -29 Overweight Nutritional Risks: None Diabetes: Yes CBG done?: No Did pt. bring in CBG monitor from home?: No (phone visit)  How often do you need to have someone help you when you read instructions, pamphlets,  or other written materials from your doctor or pharmacy?: 1 - Never  Diabetes:  Is the patient diabetic?  Yes  If diabetic, was a CBG obtained today?  No  Did the patient bring in their glucometer from home?  No  phone visit How often do you monitor your CBG's? never.   Financial Strains and Diabetes Management:  Are you having any financial strains with the device, your supplies or your medication? No .  Does the patient want to be seen by Chronic Care Management for management of their diabetes?  No  Would the patient like to be referred to a Nutritionist or for Diabetic Management?  No   Diabetic Exams:  Diabetic Eye  Exam: Completed 06/30/2021.   Diabetic Foot Exam: Completed 02/28/2021.   Interpreter Needed?: No  Information entered by :: Caroleen Hamman LPN   Activities of Daily Living In your present state of health, do you have any difficulty performing the following activities: 12/01/2021  Hearing? N  Vision? N  Difficulty concentrating or making decisions? N  Walking or climbing stairs? N  Dressing or bathing? N  Doing errands, shopping? N  Preparing Food and eating ? N  Using the Toilet? N  In the past six months, have you accidently leaked urine? Y  Comment occasionally  Do you have problems with loss of bowel control? N  Managing your Medications? N  Managing your Finances? N  Housekeeping or managing your Housekeeping? N  Some recent data might be hidden    Patient Care Team: Shelda Pal, DO as PCP - General (Family Medicine) Pyrtle, Lajuan Lines, MD as Consulting Physician (Gastroenterology)  Indicate any recent Medical Services you may have received from other than [facility] providers in the past year (date may be approximate).     Assessment:   This is a routine wellness examination for [place].  Hearing/Vision screen Hearing Screening - Comments:: No issues Vision Screening - Comments:: Last eye exam-06/2021-Dr. Trinna Post  Dietary issues and exercise  activities discussed: Current Exercise Habits: Home exercise routine, Type of exercise: walking, Time (Minutes): 30, Frequency (Times/Week): 3, Weekly Exercise (Minutes/Week): 90, Intensity: Mild, Exercise limited by: None identified   Goals Addressed             This Visit's Progress    Patient Stated       Increase exercise       Depression Screen PHQ 2/9 Scores 12/01/2021 06/13/2021 08/18/2020 06/04/2017 01/24/2016 12/27/2014 04/17/2014  PHQ - 2 Score 0 0 0 0 0 0 0  PHQ- 9 Score - - - 0 - - -    Fall Risk Fall Risk  12/01/2021 02/28/2021 01/20/2019 06/04/2017 01/24/2016  Falls in the past year? 0 0 0 No No  Number falls in past yr: 0 0 - - -  Injury with Fall? 0 0 - - -  Follow up Falls prevention discussed - - - -    FALL RISK PREVENTION PERTAINING TO THE HOME:  Any stairs in or around the home? No  Home free of loose throw rugs in walkways, pet beds, electrical cords, etc? Yes  Adequate lighting in your home to reduce risk of falls? Yes   ASSISTIVE DEVICES UTILIZED TO PREVENT FALLS:  Life alert? No  Use of a cane, walker or w/c? No  Grab bars in the bathroom? Yes  Shower chair or bench in shower? Yes  Elevated toilet seat or a handicapped toilet? No   TIMED UP AND GO:  Was the test performed? No . Phone visit   Cognitive Function:Normal cognitive status assessed by this Nurse Health Advisor. No abnormalities found.          Immunizations Immunization History  Administered Date(s) Administered   Influenza-Unspecified 11/28/2021   PFIZER(Purple Top)SARS-COV-2 Vaccination 01/14/2020, 02/04/2020, 12/13/2020   Pneumococcal Conjugate-13 01/17/2015   Pneumococcal Polysaccharide-23 01/19/2016   Tdap 01/03/2012    TDAP status: Up to date  Flu Vaccine status: Up to date  Pneumococcal vaccine status: Up to date  Covid-19 vaccine status: Information provided on how to obtain vaccines.   Qualifies for Shingles Vaccine? Yes   Zostavax completed No   Shingrix  Completed?: No.    Education has been provided regarding the  importance of this vaccine. Patient has been advised to call insurance company to determine out of pocket expense if they have not yet received this vaccine. Advised may also receive vaccine at local pharmacy or Health Dept. Verbalized acceptance and understanding.  Screening Tests Health Maintenance  Topic Date Due   COVID-19 Vaccine (4 - Booster for Pfizer series) 12/31/2021 (Originally 02/07/2021)   Zoster Vaccines- Shingrix (1 of 2) 01/31/2022 (Originally 04/07/1990)   TETANUS/TDAP  01/02/2022   FOOT EXAM  02/28/2022   HEMOGLOBIN A1C  04/30/2022   OPHTHALMOLOGY EXAM  06/30/2022   Pneumonia Vaccine 33+ Years old  Completed   HPV VACCINES  Aged Out   INFLUENZA VACCINE  Discontinued   COLONOSCOPY (Pts 45-9yrs Insurance coverage will need to be confirmed)  Discontinued    Health Maintenance  There are no preventive care reminders to display for this patient.  Colorectal cancer screening: No longer required.   Lung Cancer Screening: (Low Dose CT Chest recommended if Age 43-80 years, 30 pack-year currently smoking OR have quit w/in 15years.) does not qualify.    Additional Screening:  Hepatitis C Screening: does not qualify  Vision Screening: Recommended annual ophthalmology exams for early detection of glaucoma and other disorders of the eye. Is the patient up to date with their annual eye exam?  Yes  Who is the provider or what is the name of the office in which the patient attends annual eye exams? Dr. Trinna Post   Dental Screening: Recommended annual dental exams for proper oral hygiene  Community Resource Referral / Chronic Care Management: CRR required this visit?  No   CCM required this visit?  No      Plan:     I have personally reviewed and noted the following in the patient's chart:   Medical and social history Use of alcohol, tobacco or illicit drugs  Current medications and supplements including  opioid prescriptions. Patient is not currently taking opioid prescriptions. Functional ability and status Nutritional status Physical activity Advanced directives List of other physicians Hospitalizations, surgeries, and ER visits in previous 12 months Vitals Screenings to include cognitive, depression, and falls Referrals and appointments  In addition, I have reviewed and discussed with patient certain preventive protocols, quality metrics, and best practice recommendations. A written personalized care plan for preventive services as well as general preventive health recommendations were provided to patient.   Due to this being a telephonic visit, the after visit summary with patients personalized plan was offered to patient via mail or my-chart. Per request, patient was mailed a copy of [place], LPN   [id number]  Nurse Health Advisor  Nurse Notes: None

## 2021-12-01 ENCOUNTER — Ambulatory Visit (INDEPENDENT_AMBULATORY_CARE_PROVIDER_SITE_OTHER): Payer: Medicare HMO

## 2021-12-01 VITALS — Ht 73.0 in | Wt 217.0 lb

## 2021-12-01 DIAGNOSIS — Z0000 Encounter for general adult medical examination without abnormal findings: Secondary | ICD-10-CM

## 2021-12-01 NOTE — Patient Instructions (Signed)
Mr. Howard Fisher , Thank you for taking time to complete your Medicare Wellness Visit. I appreciate your ongoing commitment to your health goals. Please review the following plan we discussed and let me know if I can assist you in the future.   Screening recommendations/referrals: Colonoscopy: No longer required Recommended yearly ophthalmology/optometry visit for glaucoma screening and checkup Recommended yearly dental visit for hygiene and checkup  Vaccinations: Influenza vaccine: Up to date Pneumococcal vaccine: Up to date Tdap vaccine: Up to date-Due-01/02/2022 Shingles vaccine: Discuss with pharmacy   Covid-19: Booster available at the pharmacy  Advanced directives: Please bring a copy of Living Will and/or Healthcare Power of Attorney for your chart.   Conditions/risks identified: See problem list  Next appointment: Follow up in one year for your annual wellness visit.   Preventive Care 81 Years and Older, Male Preventive care refers to lifestyle choices and visits with your health care provider that can promote health and wellness. What does preventive care include? A yearly physical exam. This is also called an annual well check. Dental exams once or twice a year. Routine eye exams. Ask your health care provider how often you should have your eyes checked. Personal lifestyle choices, including: Daily care of your teeth and gums. Regular physical activity. Eating a healthy diet. Avoiding tobacco and drug use. Limiting alcohol use. Practicing safe sex. Taking low doses of aspirin every day. Taking vitamin and mineral supplements as recommended by your health care provider. What happens during an annual well check? The services and screenings done by your health care provider during your annual well check will depend on your age, overall health, lifestyle risk factors, and family history of disease. Counseling  Your health care provider may ask you questions about your: Alcohol  use. Tobacco use. Drug use. Emotional well-being. Home and relationship well-being. Sexual activity. Eating habits. History of falls. Memory and ability to understand (cognition). Work and work Statistician. Screening  You may have the following tests or measurements: Height, weight, and BMI. Blood pressure. Lipid and cholesterol levels. These may be checked every 5 years, or more frequently if you are over 81 years old. Skin check. Lung cancer screening. You may have this screening every year starting at age 81 if you have a 30-pack-year history of smoking and currently smoke or have quit within the past 15 years. Fecal occult blood test (FOBT) of the stool. You may have this test every year starting at age 81. Flexible sigmoidoscopy or colonoscopy. You may have a sigmoidoscopy every 5 years or a colonoscopy every 10 years starting at age 81. Prostate cancer screening. Recommendations will vary depending on your family history and other risks. Hepatitis C blood test. Hepatitis B blood test. Sexually transmitted disease (STD) testing. Diabetes screening. This is done by checking your blood sugar (glucose) after you have not eaten for a while (fasting). You may have this done every 1-3 years. Abdominal aortic aneurysm (AAA) screening. You may need this if you are a current or former smoker. Osteoporosis. You may be screened starting at age 81 if you are at high risk. Talk with your health care provider about your test results, treatment options, and if necessary, the need for more tests. Vaccines  Your health care provider may recommend certain vaccines, such as: Influenza vaccine. This is recommended every year. Tetanus, diphtheria, and acellular pertussis (Tdap, Td) vaccine. You may need a Td booster every 10 years. Zoster vaccine. You may need this after age 81. Pneumococcal 13-valent conjugate (PCV13) vaccine.  One dose is recommended after age 81. Pneumococcal polysaccharide  (PPSV23) vaccine. One dose is recommended after age 6. Talk to your health care provider about which screenings and vaccines you need and how often you need them. This information is not intended to replace advice given to you by your health care provider. Make sure you discuss any questions you have with your health care provider. Document Released: 01/07/2016 Document Revised: 08/30/2016 Document Reviewed: 10/12/2015 Elsevier Interactive Patient Education  2017 [place] Prevention in the Home Falls can cause injuries. They can happen to people of all ages. There are many things you can do to make your home safe and to help prevent falls. What can I do on the outside of my home? Regularly fix the edges of walkways and driveways and fix any cracks. Remove anything that might make you trip as you walk through a door, such as a raised step or threshold. Trim any bushes or trees on the path to your home. Use bright outdoor lighting. Clear any walking paths of anything that might make someone trip, such as rocks or tools. Regularly check to see if handrails are loose or broken. Make sure that both sides of any steps have handrails. Any raised decks and porches should have guardrails on the edges. Have any leaves, snow, or ice cleared regularly. Use sand or salt on walking paths during winter. Clean up any spills in your garage right away. This includes oil or grease spills. What can I do in the bathroom? Use night lights. Install grab bars by the toilet and in the tub and shower. Do not use towel bars as grab bars. Use non-skid mats or decals in the tub or shower. If you need to sit down in the shower, use a plastic, non-slip stool. Keep the floor dry. Clean up any water that spills on the floor as soon as it happens. Remove soap buildup in the tub or shower regularly. Attach bath mats securely with double-sided non-slip rug tape. Do not have throw rugs and other things on the  floor that can make you trip. What can I do in the bedroom? Use night lights. Make sure that you have a light by your bed that is easy to reach. Do not use any sheets or blankets that are too big for your bed. They should not hang down onto the floor. Have a firm chair that has side arms. You can use this for support while you get dressed. Do not have throw rugs and other things on the floor that can make you trip. What can I do in the kitchen? Clean up any spills right away. Avoid walking on wet floors. Keep items that you use a lot in easy-to-reach places. If you need to reach something above you, use a strong step stool that has a grab bar. Keep electrical cords out of the way. Do not use floor polish or wax that makes floors slippery. If you must use wax, use non-skid floor wax. Do not have throw rugs and other things on the floor that can make you trip. What can I do with my stairs? Do not leave any items on the stairs. Make sure that there are handrails on both sides of the stairs and use them. Fix handrails that are broken or loose. Make sure that handrails are as long as the stairways. Check any carpeting to make sure that it is firmly attached to the stairs. Fix any carpet that is loose or  worn. Avoid having throw rugs at the top or bottom of the stairs. If you do have throw rugs, attach them to the floor with carpet tape. Make sure that you have a light switch at the top of the stairs and the bottom of the stairs. If you do not have them, ask someone to add them for you. What else can I do to help prevent falls? Wear shoes that: Do not have high heels. Have rubber bottoms. Are comfortable and fit you well. Are closed at the toe. Do not wear sandals. If you use a stepladder: Make sure that it is fully opened. Do not climb a closed stepladder. Make sure that both sides of the stepladder are locked into place. Ask someone to hold it for you, if possible. Clearly mark and make  sure that you can see: Any grab bars or handrails. First and last steps. Where the edge of each step is. Use tools that help you move around (mobility aids) if they are needed. These include: Canes. Walkers. Scooters. Crutches. Turn on the lights when you go into a dark area. Replace any light bulbs as soon as they burn out. Set up your furniture so you have a clear path. Avoid moving your furniture around. If any of your floors are uneven, fix them. If there are any pets around you, be aware of where they are. Review your medicines with your doctor. Some medicines can make you feel dizzy. This can increase your chance of falling. Ask your doctor what other things that you can do to help prevent falls. This information is not intended to replace advice given to you by your health care provider. Make sure you discuss any questions you have with your health care provider. Document Released: 10/07/2009 Document Revised: 05/18/2016 Document Reviewed: 01/15/2015 Elsevier Interactive Patient Education  2017 [employer].

## 2021-12-30 ENCOUNTER — Other Ambulatory Visit (INDEPENDENT_AMBULATORY_CARE_PROVIDER_SITE_OTHER): Payer: Medicare HMO

## 2021-12-30 DIAGNOSIS — E7800 Pure hypercholesterolemia, unspecified: Secondary | ICD-10-CM

## 2021-12-30 LAB — LIPID PANEL
Cholesterol: 156 mg/dL (ref 0–200)
HDL: 40.2 mg/dL (ref >39.00)
LDL Cholesterol: 88 mg/dL (ref 0–99)
NonHDL: 115.44
Total CHOL/HDL Ratio: 4
Triglycerides: 139 mg/dL (ref 0.0–149.0)
VLDL: 27.8 mg/dL (ref 0.0–40.0)

## 2022-01-20 ENCOUNTER — Other Ambulatory Visit: Payer: Self-pay | Admitting: Family Medicine

## 2022-01-23 ENCOUNTER — Other Ambulatory Visit: Payer: Self-pay | Admitting: Family Medicine

## 2022-01-26 ENCOUNTER — Telehealth: Payer: Self-pay | Admitting: Family Medicine

## 2022-01-26 NOTE — Telephone Encounter (Signed)
[facility] states they are currently out of simvastatin (ZOCOR) 40 MG tablet they can be reached at [phone] option 2, reference #: [id number].

## 2022-01-27 ENCOUNTER — Other Ambulatory Visit: Payer: Self-pay | Admitting: Family Medicine

## 2022-01-27 MED ORDER — SIMVASTATIN 20 MG PO TABS: 40.0000 mg | ORAL_TABLET | Freq: Every day | ORAL | 0 refills | Status: DC

## 2022-01-27 NOTE — Telephone Encounter (Signed)
Sent in

## 2022-02-01 ENCOUNTER — Ambulatory Visit: Payer: Medicare HMO | Admitting: Neurology

## 2022-02-01 NOTE — Progress Notes (Signed)
HISTORY OF PRESENT ILLNESS: Howard Fisher is a 81 year old male, seen in refer by his primary care doctor Shelda Pal, for evaluation of tremor, involving his right arm and right hand, initial evaluation was on October 09 2017.   I have reviewed and summarized referring note, he has history of hypertension, diabetes, hyperlipidemia,   Since May of 2018, he noticed gradual onset right hand tremor, most noticeable when he is resting his right arm, he denies significant weakness, no left hand involvement,   He had a history of right shoulder injury in 2016 after he fell and landed on his right shoulder.   He denies loss sense of smell, he denies REM sleep disorder, no dizziness, chronic constipation, no family history of Parkinson's disease, he denies gait abnormality.   MRI of the brain November 2017 at the [facility] showed atrophy and small vessel disease no acute abnormality.   Laboratory evaluation in August 2018, normal magnesium, CBC, lipid profile, A1c was 6.1, normal TSH,   Update January 15, 2018. 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  He started  to take Azilect 1 mg daily since October, getting prescription from [place], he denies significant side effect, no significant improvement   UPDATE July 15 2018:YY He is accompanied by his wife at today's clinical visit, he is taking Sinemet 25/100 mg 3 times a day at 9, 2, 8 PM, no significant side effect noticed, there was no wearing off noticed, he has mild limitation mainly due to pain in his shoulders, no significant gait abnormality,  He has a lot of stress due to recent move, has been moody   UPDATE July 22, 2019, He is with his wife, his Parkinson's disease is overall under good control, getting Azilect 1 mg daily from taking Sinemet 25/100 mg 3 times a day he  ambulated without difficulty  UPDATE Apr 27 2021: He is accompanied by his wife at today's clinical visit, he functions very well, denies difficulty walking, only intermittent hand tremor, he is still getting Azilect 1 mg daily, Sinemet 25/100 mg 1 in the morning, 1 and half at noon, 1 tablet at nighttime, he denies significant side effect, no wearing off,  Update February 02, 2022 SS: here with his wife, doing well, on Sinemet 25/100 mg 1 tablet 3 times daily, mostly tremor in right hand. Denies freezing spells. Sleeping well. Both he and his wife got COVID back in September, he recovered great, his wife still having walking issues. He looks good today.   REVIEW OF SYSTEMS: Out of a complete 14 system review of symptoms, the patient complains only of the following symptoms, and all other reviewed systems are negative.  See HPI  ALLERGIES: Allergies  Allergen Reactions   Penicillins     REACTION: rash and itching    HOME MEDICATIONS: Outpatient Medications Prior to Visit  Medication Sig Dispense Refill   fenofibrate (TRICOR) 48 MG tablet Take 1 tablet (48 mg total) by mouth daily. 30 tablet 3   lisinopril-hydrochlorothiazide (ZESTORETIC) 20-12.5 MG tablet Take 1 tablet by mouth daily. 90 tablet 3   omeprazole (PRILOSEC) 20 MG capsule Take 1 capsule (20 mg total) by mouth daily. 90 capsule 3   simvastatin (ZOCOR) 40 MG tablet PLEASE UPDATE FOR ANY MEDICATION CHANGE 90 tablet 3   TURMERIC CURCUMIN PO Take 1 tablet by mouth daily.  carbidopa-levodopa (SINEMET IR) 25-100 MG tablet Take 1.5 tablets by mouth 3 (three) times daily. 270 tablet 4   rasagiline (AZILECT) 1 MG TABS tablet Take 1 tablet (1 mg total) by mouth daily. 90 tablet 3   simvastatin (ZOCOR) 20 MG tablet Take 2 tablets (40 mg total) by mouth daily. 180 tablet 0   No facility-administered medications prior to visit.    PAST MEDICAL HISTORY: Past Medical History:  Diagnosis Date   Allergic rhinitis    Basal cell  adenocarcinoma 2015   face   Diabetes mellitus without complication (HCC)    GERD (gastroesophageal reflux disease)    Hiatal hernia    History of stomach ulcers    1985   Hypercholesteremia    Hypertension    Melanoma ([place]) 2014   shoulder   Parkinson disease ([place])    Peyronie's disease     PAST SURGICAL HISTORY: Past Surgical History:  Procedure Laterality Date   basal cell removed from face  12/2012   CATARACT EXTRACTION Bilateral 11/2009   Dr. Charise Killian   CHOLECYSTECTOMY  02/1993   Dr. Harlow Asa   right hernia repair  2002   Dr. Deon Pilling   skin cancer removed from right shoulder  11/2013   melanoma    FAMILY HISTORY: Family History  Problem Relation Age of Onset   Diabetes Mother    Dementia Mother    Dementia Father    Diabetes Sister    Healthy Son        x2   Healthy Daughter        x1   Colon cancer Neg Hx     SOCIAL HISTORY: Social History   Socioeconomic History   Marital status: Married    Spouse name: [name]   Number of children: 3   Years of education: One year college   Highest education level: Not on file  Occupational History   Occupation: retired Administrator  Tobacco Use   Smoking status: Never   Smokeless tobacco: Never  Vaping Use   Vaping Use: Never used  Substance and Sexual Activity   Alcohol use: Yes    Alcohol/week: 1.0 standard drink    Types: 1 Glasses of wine per week    Comment: 3 drinks per week   Drug use: No   Sexual activity: Not on file  Other Topics Concern   Not on file  Social History Narrative   Lives at home with his wife.   Right-handed.   2 cups coffee in the morning.   Social Determinants of Health   Financial Resource Strain: Low Risk    Difficulty of Paying Living Expenses: Not hard at all  Food Insecurity: No Food Insecurity   Worried About Charity fundraiser in the Last Year: Never true   [place] in the Last Year: Never true  Transportation Needs: No Transportation Needs   Lack of Transportation  (Medical): No   Lack of Transportation (Non-Medical): No  Physical Activity: Insufficiently Active   Days of Exercise per Week: 3 days   Minutes of Exercise per Session: 30 min  Stress: No Stress Concern Present   Feeling of Stress : Not at all  Social Connections: Moderately Isolated   Frequency of Communication with Friends and Family: More than three times a week   Frequency of Social Gatherings with Friends and Family: More than three times a week   Attends Religious Services: Never   Marine scientist or Organizations: No  Attends Archivist Meetings: Never   Marital Status: Married  Human resources officer Violence: Not At Risk   Fear of Current or Ex-Partner: No   Emotionally Abused: No   Physically Abused: No   Sexually Abused: No    PHYSICAL EXAM  Vitals:   02/02/22 1122  BP: (!) 143/84  Pulse: 75  Weight: 204 lb (92.5 kg)  Height: 6\' 1"  (1.854 m)    Body mass index is 26.91 kg/m.  Generalized: Well developed, in no acute distress  Neurological examination  Mentation: Alert oriented to time, place, history taking. Follows all commands speech and language fluent Cranial nerve II-XII: Pupils were equal round reactive to light. Extraocular movements were full, visual field were full on confrontational test. Facial sensation and strength were normal. Head turning and shoulder shrug were normal and symmetric. Motor: Normal strength, mild right more than left rigidity, right hans resting tremor Sensory: Sensory testing is intact to soft touch on all 4 extremities. No evidence of extinction is noted.  Coordination: Cerebellar testing reveals good finger-nose-finger and heel-to-shin bilaterally.   Gait and station: Good pace, good strides, slight decreased arm swing on the right, good turns, overall steady. Reflexes: Deep tendon reflexes are symmetric and normal bilaterally.   DIAGNOSTIC DATA (LABS, IMAGING, TESTING) - I reviewed patient records, labs, notes,  testing and imaging myself where available.  Lab Results  Component Value Date   WBC 6.9 02/07/2018   HGB 15.9 02/07/2018   HCT 45.4 02/07/2018   MCV 94.2 02/07/2018   PLT 204.0 02/07/2018      Component Value Date/Time   NA 137 10/31/2021 1539   K 4.1 10/31/2021 1539   CL 99 10/31/2021 1539   CO2 29 10/31/2021 1539   GLUCOSE 118 (H) 10/31/2021 1539   BUN 13 10/31/2021 1539   CREATININE 0.84 10/31/2021 1539   CREATININE 1.03 04/17/2014 1026   CALCIUM 9.4 10/31/2021 1539   PROT 7.0 10/31/2021 1539   ALBUMIN 4.2 10/31/2021 1539   AST 23 10/31/2021 1539   ALT 18 10/31/2021 1539   ALKPHOS 62 10/31/2021 1539   BILITOT 0.6 10/31/2021 1539   GFRNONAA 57 (L) 09/19/2021 1050   GFRAA 86 12/17/2008 0946   Lab Results  Component Value Date   CHOL 156 12/30/2021   HDL 40.20 12/30/2021   LDLCALC 88 12/30/2021   LDLDIRECT 84.0 11/15/2021   TRIG 139.0 12/30/2021   CHOLHDL 4 12/30/2021   Lab Results  Component Value Date   HGBA1C 7.4 (H) 10/31/2021   No results found for: VITAMINB12 Lab Results  Component Value Date   TSH 4.36 09/27/2016   ASSESSMENT AND PLAN 81 y.o. year old male   Idiopathic Parkinson's disease  -He is doing well today, stable  -Continue Sinemet 25/100 mg 3 times daily -Continue Azilect 1 mg daily, RN working on tier exception to help with cost, submitted today -Encouraged moderate exercise, activity -Follow-up in 6 months or sooner if needed   Butler Denmark, Laqueta Jean, [facility] [address] [place], [place] [address] [phone]

## 2022-02-02 ENCOUNTER — Telehealth: Payer: Self-pay | Admitting: *Deleted

## 2022-02-02 ENCOUNTER — Other Ambulatory Visit: Payer: Self-pay

## 2022-02-02 ENCOUNTER — Ambulatory Visit: Payer: Medicare HMO | Admitting: Neurology

## 2022-02-02 ENCOUNTER — Encounter: Payer: Self-pay | Admitting: Neurology

## 2022-02-02 VITALS — BP 143/84 | HR 75 | Ht 73.0 in | Wt 204.0 lb

## 2022-02-02 DIAGNOSIS — G20 Parkinson's disease: Secondary | ICD-10-CM

## 2022-02-02 MED ORDER — RASAGILINE MESYLATE 1 MG PO TABS: 1.0000 mg | ORAL_TABLET | Freq: Every day | ORAL | 3 refills | Status: DC

## 2022-02-02 MED ORDER — CARBIDOPA-LEVODOPA 25-100 MG PO TABS: 1.5000 | ORAL_TABLET | Freq: Three times a day (TID) | ORAL | 4 refills | Status: DC

## 2022-02-02 NOTE — Telephone Encounter (Signed)
Tier change request approved from 12/25/2021 through 12/24/2022. Call [phone] for any questions.  I called the patient's wife and provided her with this update.

## 2022-02-02 NOTE — Telephone Encounter (Signed)
Per review of his chart, he has been approved for a tier exception in the past.    New case has been started on covermymeds (key: [id number]). Pt has pharmacy coverage through [employer] (ID#[id number]). Decsion pending.

## 2022-02-07 ENCOUNTER — Telehealth: Payer: Self-pay | Admitting: Family Medicine

## 2022-02-07 MED ORDER — SIMVASTATIN 40 MG PO TABS: ORAL_TABLET | ORAL | 3 refills | Status: DC

## 2022-02-07 NOTE — Telephone Encounter (Signed)
Sent in as requested. Patient informed

## 2022-02-07 NOTE — Telephone Encounter (Signed)
Pt was very adamant that this needs to be 40mg  90 day supply.   Medication: simvastatin (ZOCOR) 40 MG tablet  Has the patient contacted their pharmacy? No.   Preferred Pharmacy: [facility], [place] [facility] [address]  [place], [place] [address]  Phone:  [phone]  Fax:  [phone]

## 2022-02-13 NOTE — Telephone Encounter (Signed)
OK to fill simvastatin while on fenofibrate due to interaction PCP said ok to do, Will call [facility] at [phone] Ref# [id number]---[facility] have been informed to take both together. Simvastatin 40 mg is now in stock and they will send this out to the patient today. Patient informed. He has enough 20's to get him through until new mail order for 40 mg comes in.

## 2022-03-13 ENCOUNTER — Telehealth: Payer: Self-pay | Admitting: Family Medicine

## 2022-03-13 MED ORDER — FENOFIBRATE 48 MG PO TABS: 48.0000 mg | ORAL_TABLET | Freq: Every day | ORAL | 2 refills | Status: DC

## 2022-03-13 NOTE — Telephone Encounter (Signed)
Patient informed of PCP instructions. ?Sent #90 with 2 refills to mail order for him. ?

## 2022-03-13 NOTE — Telephone Encounter (Signed)
Patient would like to know if he is supposed to continue to take his Fenofibrate medication. He states that he was only supposed to take it for a limited time and he is running out of pills. He would like a call back as soon as possible. Please advise.  ?

## 2022-03-20 ENCOUNTER — Telehealth: Payer: Self-pay | Admitting: Family Medicine

## 2022-03-20 NOTE — Telephone Encounter (Signed)
Pt aware and scheduled for 05/02/22 at 1:30pm ?

## 2022-03-20 NOTE — Telephone Encounter (Signed)
PT stated he started this new med about 8 weeks ago. He states that he wants to know if he can come in a do blood work to see if he can be removed off med   ? ?fenofibrate (TRICOR) 48 MG tablet ?

## 2022-05-02 ENCOUNTER — Ambulatory Visit (INDEPENDENT_AMBULATORY_CARE_PROVIDER_SITE_OTHER): Payer: Medicare HMO | Admitting: Family Medicine

## 2022-05-02 ENCOUNTER — Encounter: Payer: Self-pay | Admitting: Family Medicine

## 2022-05-02 VITALS — BP 128/80 | HR 86 | Temp 98.4°F | Ht 73.0 in | Wt 224.0 lb

## 2022-05-02 DIAGNOSIS — Z0000 Encounter for general adult medical examination without abnormal findings: Secondary | ICD-10-CM

## 2022-05-02 DIAGNOSIS — E1165 Type 2 diabetes mellitus with hyperglycemia: Secondary | ICD-10-CM | POA: Diagnosis not present

## 2022-05-02 LAB — COMPREHENSIVE METABOLIC PANEL
ALT: 14 U/L (ref 0–53)
AST: 34 U/L (ref 0–37)
Albumin: 4.6 g/dL (ref 3.5–5.2)
Alkaline Phosphatase: 59 U/L (ref 39–117)
BUN: 15 mg/dL (ref 6–23)
CO2: 30 mEq/L (ref 19–32)
Calcium: 9.6 mg/dL (ref 8.4–10.5)
Chloride: 100 mEq/L (ref 96–112)
Creatinine, Ser: 1.22 mg/dL (ref 0.40–1.50)
GFR: 55.38 mL/min — ABNORMAL LOW (ref >60.00)
Glucose, Bld: 144 mg/dL — ABNORMAL HIGH (ref 70–99)
Potassium: 4.3 mEq/L (ref 3.5–5.1)
Sodium: 141 mEq/L (ref 135–145)
Total Bilirubin: 0.8 mg/dL (ref 0.2–1.2)
Total Protein: 7.2 g/dL (ref 6.0–8.3)

## 2022-05-02 LAB — LIPID PANEL
Cholesterol: 146 mg/dL (ref 0–200)
HDL: 38.6 mg/dL — ABNORMAL LOW
NonHDL: 107.77
Total CHOL/HDL Ratio: 4
Triglycerides: 223 mg/dL — ABNORMAL HIGH (ref 0.0–149.0)
VLDL: 44.6 mg/dL — ABNORMAL HIGH (ref 0.0–40.0)

## 2022-05-02 LAB — HEMOGLOBIN A1C: Hgb A1c MFr Bld: 7.3 % — ABNORMAL HIGH (ref 4.6–6.5)

## 2022-05-02 LAB — MICROALBUMIN / CREATININE URINE RATIO
Creatinine,U: 128.1 mg/dL
Microalb Creat Ratio: 0.5 mg/g (ref 0.0–30.0)
Microalb, Ur: <0.7 mg/dL (ref 0.0–1.9)

## 2022-05-02 LAB — LDL CHOLESTEROL, DIRECT: Direct LDL: 85 mg/dL

## 2022-05-02 NOTE — Progress Notes (Signed)
Chief Complaint  ?Patient presents with  ? Annual Exam  ? ? ?Well Male ?Howard Fisher is here for a complete physical.   ?His last physical was >1 year ago.  ?Current diet: in general, a "healthy" diet.   ?Current exercise: walking, golfing, lifting weights ?Weight trend: stable ?Fatigue out of ordinary? No. ?Seat belt? Yes.   ?Advanced directive? Yes ? ?Health maintenance ?Shingrix- No ?Tetanus- Due ?Pneumonia vaccine- Yes ? ?Past Medical History:  ?Diagnosis Date  ? Allergic rhinitis   ? Basal cell adenocarcinoma 2015  ? face  ? Diabetes mellitus without complication ([place])   ? GERD (gastroesophageal reflux disease)   ? Hiatal hernia   ? History of stomach ulcers   ? 1985  ? Hypercholesteremia   ? Hypertension   ? Melanoma ([place]) 2014  ? shoulder  ? Parkinson disease ([place])   ? Peyronie's disease   ?  ? ?Past Surgical History:  ?Procedure Laterality Date  ? basal cell removed from face  12/2012  ? CATARACT EXTRACTION Bilateral 11/2009  ? Dr. Charise Killian  ? CHOLECYSTECTOMY  02/1993  ? Dr. Harlow Asa  ? right hernia repair  2002  ? Dr. Deon Pilling  ? skin cancer removed from right shoulder  11/2013  ? melanoma  ? ? ?Medications  ?Current Outpatient Medications on File Prior to Visit  ?Medication Sig Dispense Refill  ? carbidopa-levodopa (SINEMET IR) 25-100 MG tablet Take 1.5 tablets by mouth 3 (three) times daily. 270 tablet 4  ? lisinopril-hydrochlorothiazide (ZESTORETIC) 20-12.5 MG tablet Take 1 tablet by mouth daily. 90 tablet 3  ? omeprazole (PRILOSEC) 20 MG capsule Take 1 capsule (20 mg total) by mouth daily. 90 capsule 3  ? rasagiline (AZILECT) 1 MG TABS tablet Take 1 tablet (1 mg total) by mouth daily. 90 tablet 3  ? simvastatin (ZOCOR) 40 MG tablet Take 1 by mouth daily 90 tablet 3  ? TURMERIC CURCUMIN PO Take 1 tablet by mouth daily.    ? ?Allergies ?Allergies  ?Allergen Reactions  ? Penicillins   ?  REACTION: rash and itching  ? ? ?Family History ?Family History  ?Problem Relation Age of Onset  ? Diabetes Mother   ? Dementia  Mother   ? Dementia Father   ? Diabetes Sister   ? Healthy Son   ?     x2  ? Healthy Daughter   ?     x1  ? Colon cancer Neg Hx   ? ? ?Review of Systems: ?Constitutional:  no fevers ?Eye:  no recent significant change in vision ?Ears:  No changes in hearing ?Nose/Mouth/Throat:  no complaints of nasal congestion, no sore throat ?Cardiovascular: no chest pain ?Respiratory:  No shortness of breath ?Gastrointestinal:  No change in bowel habits ?GU:  No frequency ?Integumentary:  no abnormal skin lesions reported ?Neurologic:  no headaches ?Endocrine:  denies unexplained weight changes ? ?Exam ?BP 128/80   Pulse 86   Temp 98.4 ?F (36.9 ?C) (Oral)   Ht '6\' 1"'$  (1.854 m)   Wt 224 lb (101.6 kg)   SpO2 94%   BMI 29.55 kg/m?  ?General:  well developed, well nourished, in no apparent distress ?Skin: various lentigos and AK's (sees derm next month, sees routinely); otherwise no significant moles, warts, or growths ?Head:  no masses, lesions, or tenderness ?Eyes:  pupils equal and round, sclera anicteric without injection ?Ears:  canals without lesions, TMs shiny without retraction, no obvious effusion, no erythema ?Nose:  nares patent, septum midline, mucosa normal ?Throat/Pharynx:  lips and gingiva without lesion; tongue and uvula midline; non-inflamed pharynx; no exudates or postnasal drainage ?Lungs:  clear to auscultation, breath sounds equal bilaterally, no respiratory distress ?Cardio:  regular rate and rhythm, no LE edema or bruits ?Rectal: Deferred ?GI: BS+, S, NT, ND, no masses or organomegaly ?Musculoskeletal:  symmetrical muscle groups noted without atrophy or deformity ?Neuro: sensation intact to pinprick on feet b/l w exception of decreased sensation to lateral forefoot on L; gait normal; deep tendon reflexes normal and symmetric ?Psych: well oriented with normal range of affect and appropriate judgment/insight ? ?Assessment and Plan ? ?Well adult exam ? ?Type 2 diabetes mellitus with hyperglycemia, without  long-term current use of insulin (HCC) - Plan: Lipid panel, Hemoglobin A1c, Comprehensive metabolic panel, Microalbumin / creatinine urine ratio  ? ?Well 81 y.o. male. ?Counseled on diet and exercise. ?Other orders as above. ?Shingrix rec'd. Tdap rec'd though ins may not cover.  ?Advanced directive form requested today.  ?Follow up in 6 mo or prn.  ?The patient voiced understanding and agreement to the plan. ? ?Shelda Pal, DO ?05/02/22 ?1:47 PM ? ?

## 2022-05-02 NOTE — Patient Instructions (Addendum)
Give [place] 2-3 business days to get the results of your labs back.  ? ?Keep the diet clean and stay active. ? ?Please get me a copy of your advanced directive form at your convenience.  ? ?The Shingrix vaccine (for shingles) is a 2 shot series spaced 2-6 months apart. It can make people feel low energy, achy and almost like they have the flu for 48 hours after injection. 1/5 people can have nausea and/or vomiting. Please plan accordingly when deciding on when to get this shot. Call your pharmacy to get this. The second shot of the series is less severe regarding the side effects, but it still lasts 48 hours.  ? ?Take Metamucil or Benefiber daily. ? ?Let us know if you need anything. ? ?EXERCISES ?RANGE OF MOTION (ROM) AND STRETCHING EXERCISES  ?These exercises may help you when beginning to rehabilitate your issue. In order to successfully resolve your symptoms, you must improve your posture. These exercises are designed to help reduce the forward-head and rounded-shoulder posture which contributes to this condition. Your symptoms may resolve with or without further involvement from your physician, physical therapist or athletic trainer. While completing these exercises, remember:  ?Restoring tissue flexibility helps normal motion to return to the joints. This allows healthier, less painful movement and activity. ?An effective stretch should be held for at least 20 seconds, although you may need to begin with shorter hold times for comfort. ?A stretch should never be painful. You should only feel a gentle lengthening or release in the stretched tissue. ?Do not do any stretch or exercise that you cannot tolerate. ? ?STRETCH- Axial Extensors ?Lie on your back on the floor. You may bend your knees for comfort. Place a rolled-up hand towel or dish towel, about 2 inches in diameter, under the part of your head that makes contact with the floor. ?Gently tuck your chin, as if trying to make a "double chin," until you feel a  gentle stretch at the base of your head. ?Hold 15-20 seconds. ?Repeat 2-3 times. Complete this exercise 1 time per day.  ? ?STRETCH - Axial Extension  ?Stand or sit on a firm surface. Assume a good posture: chest up, shoulders drawn back, abdominal muscles slightly tense, knees unlocked (if standing) and feet hip width apart. ?Slowly retract your chin so your head slides back and your chin slightly lowers. Continue to look straight ahead. ?You should feel a gentle stretch in the back of your head. Be certain not to feel an aggressive stretch since this can cause headaches later. ?Hold for 15-20 seconds. ?Repeat 2-3 times. Complete this exercise 1 time per day. ? ?STRETCH - Cervical Side Bend  ?Stand or sit on a firm surface. Assume a good posture: chest up, shoulders drawn back, abdominal muscles slightly tense, knees unlocked (if standing) and feet hip width apart. ?Without letting your nose or shoulders move, slowly tip your right / left ear to your shoulder until your feel a gentle stretch in the muscles on the opposite side of your neck. ?Hold 15-20 seconds. ?Repeat 2-3 times. Complete this exercise 1-2 times per day. ? ?STRETCH - Cervical Rotators  ?Stand or sit on a firm surface. Assume a good posture: chest up, shoulders drawn back, abdominal muscles slightly tense, knees unlocked (if standing) and feet hip width apart. ?Keeping your eyes level with the ground, slowly turn your head until you feel a gentle stretch along the back and opposite side of your neck. ?Hold 15-20 seconds. ?Repeat 2-3 times.  Complete this exercise 1-2 times per day. ? ?RANGE OF MOTION - Neck Circles  ?Stand or sit on a firm surface. Assume a good posture: chest up, shoulders drawn back, abdominal muscles slightly tense, knees unlocked (if standing) and feet hip width apart. ?Gently roll your head down and around from the back of one shoulder to the back of the other. The motion should never be forced or painful. ?Repeat the motion  10-20 times, or until you feel the neck muscles relax and loosen. ?Repeat 2-3 times. Complete the exercise 1-2 times per day. ?STRENGTHENING EXERCISES - Cervical Strain and Sprain ?These exercises may help you when beginning to rehabilitate your injury. They may resolve your symptoms with or without further involvement from your physician, physical therapist, or athletic trainer. While completing these exercises, remember:  ?Muscles can gain both the endurance and the strength needed for everyday activities through controlled exercises. ?Complete these exercises as instructed by your physician, physical therapist, or athletic trainer. Progress the resistance and repetitions only as guided. ?You may experience muscle soreness or fatigue, but the pain or discomfort you are trying to eliminate should never worsen during these exercises. If this pain does worsen, stop and make certain you are following the directions exactly. If the pain is still present after adjustments, discontinue the exercise until you can discuss the trouble with your clinician. ? ?STRENGTH - Cervical Flexors, Isometric ?Face a wall, standing about 6 inches away. Place a small pillow, a ball about 6-8 inches in diameter, or a folded towel between your forehead and the wall. ?Slightly tuck your chin and gently push your forehead into the soft object. Push only with mild to moderate intensity, building up tension gradually. Keep your jaw and forehead relaxed. ?Hold 10 to 20 seconds. Keep your breathing relaxed. ?Release the tension slowly. Relax your neck muscles completely before you start the next repetition. ?Repeat 2-3 times. Complete this exercise 1 time per day. ? ?STRENGTH- Cervical Lateral Flexors, Isometric  ?Stand about 6 inches away from a wall. Place a small pillow, a ball about 6-8 inches in diameter, or a folded towel between the side of your head and the wall. ?Slightly tuck your chin and gently tilt your head into the soft object.  Push only with mild to moderate intensity, building up tension gradually. Keep your jaw and forehead relaxed. ?Hold 10 to 20 seconds. Keep your breathing relaxed. ?Release the tension slowly. Relax your neck muscles completely before you start the next repetition. ?Repeat 2-3 times. Complete this exercise 1 time per day. ? ?STRENGTH - Cervical Extensors, Isometric  ?Stand about 6 inches away from a wall. Place a small pillow, a ball about 6-8 inches in diameter, or a folded towel between the back of your head and the wall. ?Slightly tuck your chin and gently tilt your head back into the soft object. Push only with mild to moderate intensity, building up tension gradually. Keep your jaw and forehead relaxed. ?Hold 10 to 20 seconds. Keep your breathing relaxed. ?Release the tension slowly. Relax your neck muscles completely before you start the next repetition. ?Repeat 2-3 times. Complete this exercise 1 time per day. ? ?POSTURE AND BODY MECHANICS CONSIDERATIONS ?Keeping correct posture when sitting, standing or completing your activities will reduce the stress put on different body tissues, allowing injured tissues a chance to heal and limiting painful experiences. The following are general guidelines for improved posture. Your physician or physical therapist will provide you with any instructions specific to your needs.  While reading these guidelines, remember: ?The exercises prescribed by your provider will help you have the flexibility and strength to maintain correct postures. ?The correct posture provides the optimal environment for your joints to work. All of your joints have less wear and tear when properly supported by a spine with good posture. This means you will experience a healthier, less painful body. ?Correct posture must be practiced with all of your activities, especially prolonged sitting and standing. Correct posture is as important when doing repetitive low-stress activities (typing) as it is when  doing a single heavy-load activity (lifting). ? ?PROLONGED STANDING WHILE SLIGHTLY LEANING FORWARD ?When completing a task that requires you to lean forward while standing in one place for a long time, pla

## 2022-05-03 ENCOUNTER — Other Ambulatory Visit: Payer: Self-pay | Admitting: Family Medicine

## 2022-05-03 DIAGNOSIS — E7800 Pure hypercholesterolemia, unspecified: Secondary | ICD-10-CM

## 2022-06-14 ENCOUNTER — Other Ambulatory Visit (INDEPENDENT_AMBULATORY_CARE_PROVIDER_SITE_OTHER): Payer: Medicare HMO

## 2022-06-14 ENCOUNTER — Other Ambulatory Visit: Payer: Self-pay

## 2022-06-14 DIAGNOSIS — E7800 Pure hypercholesterolemia, unspecified: Secondary | ICD-10-CM

## 2022-06-14 NOTE — Addendum Note (Signed)
Addended by: Manuela Schwartz on: 06/14/2022 12:58 PM   Modules accepted: Orders

## 2022-06-15 LAB — LIPID PANEL
Cholesterol: 158 mg/dL (ref 0–200)
HDL: 43.9 mg/dL (ref >39.00)
LDL Cholesterol: 74 mg/dL (ref 0–99)
NonHDL: 113.65
Total CHOL/HDL Ratio: 4
Triglycerides: 200 mg/dL — ABNORMAL HIGH (ref 0.0–149.0)
VLDL: 40 mg/dL (ref 0.0–40.0)

## 2022-06-15 MED ORDER — FENOFIBRATE 48 MG PO TABS: 48.0000 mg | ORAL_TABLET | Freq: Every day | ORAL | 0 refills | Status: DC

## 2022-06-15 NOTE — Addendum Note (Signed)
Addended byDamita Dunnings D on: 06/15/2022 04:22 PM   Modules accepted: Orders

## 2022-07-17 DIAGNOSIS — L720 Epidermal cyst: Secondary | ICD-10-CM | POA: Diagnosis not present

## 2022-07-17 DIAGNOSIS — Z86008 Personal history of in-situ neoplasm of other site: Secondary | ICD-10-CM | POA: Diagnosis not present

## 2022-07-17 DIAGNOSIS — L821 Other seborrheic keratosis: Secondary | ICD-10-CM | POA: Diagnosis not present

## 2022-07-17 DIAGNOSIS — Z129 Encounter for screening for malignant neoplasm, site unspecified: Secondary | ICD-10-CM | POA: Diagnosis not present

## 2022-07-17 DIAGNOSIS — L570 Actinic keratosis: Secondary | ICD-10-CM | POA: Diagnosis not present

## 2022-07-24 ENCOUNTER — Other Ambulatory Visit: Payer: Self-pay | Admitting: Family Medicine

## 2022-07-24 MED ORDER — LISINOPRIL-HYDROCHLOROTHIAZIDE 20-12.5 MG PO TABS: 1.0000 | ORAL_TABLET | Freq: Every day | ORAL | 3 refills | Status: DC

## 2022-07-27 ENCOUNTER — Other Ambulatory Visit (INDEPENDENT_AMBULATORY_CARE_PROVIDER_SITE_OTHER): Payer: Medicare HMO

## 2022-07-27 DIAGNOSIS — E7800 Pure hypercholesterolemia, unspecified: Secondary | ICD-10-CM | POA: Diagnosis not present

## 2022-07-27 LAB — HEPATIC FUNCTION PANEL
ALT: 8 U/L (ref 0–53)
AST: 26 U/L (ref 0–37)
Albumin: 4.4 g/dL (ref 3.5–5.2)
Alkaline Phosphatase: 52 U/L (ref 39–117)
Bilirubin, Direct: 0.1 mg/dL (ref 0.0–0.3)
Total Bilirubin: 0.6 mg/dL (ref 0.2–1.2)
Total Protein: 6.8 g/dL (ref 6.0–8.3)

## 2022-07-27 LAB — LIPID PANEL
Cholesterol: 163 mg/dL (ref 0–200)
HDL: 35.5 mg/dL — ABNORMAL LOW (ref >39.00)
LDL Cholesterol: 102 mg/dL — ABNORMAL HIGH (ref 0–99)
NonHDL: 127.41
Total CHOL/HDL Ratio: 5
Triglycerides: 125 mg/dL (ref 0.0–149.0)
VLDL: 25 mg/dL (ref 0.0–40.0)

## 2022-08-15 ENCOUNTER — Encounter: Payer: Self-pay | Admitting: Neurology

## 2022-08-15 ENCOUNTER — Ambulatory Visit (INDEPENDENT_AMBULATORY_CARE_PROVIDER_SITE_OTHER): Payer: Medicare HMO | Admitting: Neurology

## 2022-08-15 VITALS — BP 150/94 | HR 74 | Ht 73.0 in | Wt 222.0 lb

## 2022-08-15 DIAGNOSIS — G20 Parkinson's disease: Secondary | ICD-10-CM | POA: Diagnosis not present

## 2022-08-15 MED ORDER — CARBIDOPA-LEVODOPA 25-100 MG PO TABS: 1.0000 | ORAL_TABLET | Freq: Four times a day (QID) | ORAL | 4 refills | Status: DC

## 2022-08-15 NOTE — Patient Instructions (Signed)
Increase Sinemet 25/100 mg 1 tablet 4 times daily Continue Azilect  Keep appointment in feb

## 2022-08-15 NOTE — Progress Notes (Signed)
HISTORY OF PRESENT ILLNESS: Howard Fisher is a 81 year old male, seen in refer by his primary care doctor Shelda Pal, for evaluation of tremor, involving his right arm and right hand, initial evaluation was on October 09 2017.   I have reviewed and summarized referring note, he has history of hypertension, diabetes, hyperlipidemia,   Since May of 2018, he noticed gradual onset right hand tremor, most noticeable when he is resting his right arm, he denies significant weakness, no left hand involvement,   He had a history of right shoulder injury in 2016 after he fell and landed on his right shoulder.   He denies loss sense of smell, he denies REM sleep disorder, no dizziness, chronic constipation, no family history of Parkinson's disease, he denies gait abnormality.   MRI of the brain November 2017 at the [facility] showed atrophy and small vessel disease no acute abnormality.   Laboratory evaluation in August 2018, normal magnesium, CBC, lipid profile, A1c was 6.1, normal TSH,   Update January 15, 2018. 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  He started  to take Azilect 1 mg daily since October, getting prescription from [place], he denies significant side effect, no significant improvement   UPDATE July 15 2018:YY He is accompanied by his wife at today's clinical visit, he is taking Sinemet 25/100 mg 3 times a day at 9, 2, 8 PM, no significant side effect noticed, there was no wearing off noticed, he has mild limitation mainly due to pain in his shoulders, no significant gait abnormality,  He has a lot of stress due to recent move, has been moody   UPDATE July 22, 2019, He is with his wife, his Parkinson's disease is overall under good control, getting Azilect 1 mg daily from taking Sinemet 25/100 mg 3 times a day he  ambulated without difficulty  UPDATE Apr 27 2021: He is accompanied by his wife at today's clinical visit, he functions very well, denies difficulty walking, only intermittent hand tremor, he is still getting Azilect 1 mg daily, Sinemet 25/100 mg 1 in the morning, 1 and half at noon, 1 tablet at nighttime, he denies significant side effect, no wearing off,  Update February 02, 2022 SS: here with his wife, doing well, on Sinemet 25/100 mg 1 tablet 3 times daily, mostly tremor in right hand. Denies freezing spells. Sleeping well. Both he and his wife got COVID back in September, he recovered great, his wife still having walking issues. He looks good today.  Update August 15, 2022 SS: Here with his wife, taking Sinemet 25/100 mg 1 tablet 3 times daily. He tried the 1.5 tablet, but he got tired of cutting it. More tremor in right hand. No falls. Playing golf once a week. He sleeps well at night, does sweat at night. No trouble eating or chewing.  His wife has been having some health issues, she is seeing Dr. Krista Blue.   REVIEW OF SYSTEMS: Out of a complete 14 system review of symptoms, the patient complains only of the following symptoms, and all other reviewed systems are negative.  See HPI  ALLERGIES: Allergies  Allergen Reactions   Penicillins     REACTION: rash and itching    HOME MEDICATIONS: Outpatient Medications Prior to Visit  Medication Sig Dispense Refill   fenofibrate (TRICOR) 48 MG tablet Take 1 tablet (48 mg total) by  mouth daily. 90 tablet 0   lisinopril-hydrochlorothiazide (ZESTORETIC) 20-12.5 MG tablet Take 1 tablet by mouth daily. 90 tablet 3   omeprazole (PRILOSEC) 20 MG capsule Take 1 capsule (20 mg total) by mouth daily. 90 capsule 3   rasagiline (AZILECT) 1 MG TABS tablet Take 1 tablet (1 mg total) by mouth daily. 90 tablet 3   simvastatin (ZOCOR) 40 MG tablet Take 1 by mouth daily 90 tablet 3   TURMERIC CURCUMIN PO Take 1 tablet by mouth daily.     carbidopa-levodopa (SINEMET  IR) 25-100 MG tablet Take 1.5 tablets by mouth 3 (three) times daily. 270 tablet 4   No facility-administered medications prior to visit.    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 Charise Killian   CHOLECYSTECTOMY  02/1993   Dr. Harlow Asa   right hernia repair  2002   Dr. Deon Pilling   skin cancer removed from right shoulder  11/2013   melanoma    FAMILY HISTORY: Family History  Problem Relation Age of Onset   Diabetes Mother    Dementia Mother    Dementia Father    Diabetes Sister    Healthy Son        x2   Healthy Daughter        x1   Colon cancer Neg Hx     SOCIAL HISTORY: Social History   Socioeconomic History   Marital status: Married    Spouse name: [name]   Number of children: 3   Years of education: One year college   Highest education level: Not on file  Occupational History   Occupation: retired Administrator  Tobacco Use   Smoking status: Never   Smokeless tobacco: Never  Vaping Use   Vaping Use: Never used  Substance and Sexual Activity   Alcohol use: Yes    Alcohol/week: 1.0 standard drink of alcohol    Types: 1 Glasses of wine per week    Comment: 3 drinks per week   Drug use: No   Sexual activity: Not on file  Other Topics Concern   Not on file  Social History Narrative   Lives at home with his wife.   Right-handed.   2 cups coffee in the morning.   Social Determinants of Health   Financial Resource Strain: Low Risk  (12/01/2021)   Overall  Financial Resource Strain (CARDIA)    Difficulty of Paying Living Expenses: Not hard at all  Food Insecurity: No Food Insecurity (12/01/2021)   Hunger Vital Sign    Worried About Running Out of Food in the Last Year: Never true    Ran Out of Food in the Last Year: Never true  Transportation Needs: No Transportation Needs (12/01/2021)   PRAPARE - Hydrologist (Medical): No    Lack of Transportation (Non-Medical): No  Physical Activity: Insufficiently Active (12/01/2021)   Exercise Vital Sign  Days of Exercise per Week: 3 days    Minutes of Exercise per Session: 30 min  Stress: No Stress Concern Present (12/01/2021)   [place]    Feeling of Stress : Not at all  Social Connections: Moderately Isolated (12/01/2021)   Social Connection and Isolation Panel [NHANES]    Frequency of Communication with Friends and Family: More than three times a week    Frequency of Social Gatherings with Friends and Family: More than three times a week    Attends Religious Services: Never    Marine scientist or Organizations: No    Attends Archivist Meetings: Never    Marital Status: Married  Human resources officer Violence: Not At Risk (12/01/2021)   Humiliation, Afraid, Rape, and Kick questionnaire    Fear of Current or Ex-Partner: No    Emotionally Abused: No    Physically Abused: No    Sexually Abused: No    PHYSICAL EXAM  Vitals:   08/15/22 1100  BP: (!) 150/94  Pulse: 74  Weight: 222 lb (100.7 kg)  Height: '6\' 1"'$  (1.854 m)   Body mass index is 29.29 kg/m.  Generalized: Well developed, in no acute distress  Neurological examination  Mentation: Alert oriented to time, place, history taking. Follows all commands speech and language fluent.  Very nice, pleasant. Cranial nerve II-XII: Pupils were equal round reactive to light. Extraocular movements were full, visual field were full on  confrontational test. Facial sensation and strength were normal. Head turning and shoulder shrug were normal and symmetric. Motor: Normal strength, mild right more than left rigidity, right hand resting tremor Sensory: Sensory testing is intact to soft touch on all 4 extremities. No evidence of extinction is noted.  Coordination: Cerebellar testing reveals good finger-nose-finger and heel-to-shin bilaterally.   Gait and station: Good pace, good strides, slight decreased arm swing on the right, good turns, overall steady.  DIAGNOSTIC DATA (LABS, IMAGING, TESTING) - I reviewed patient records, labs, notes, testing and imaging myself where available.  Lab Results  Component Value Date   WBC 6.9 02/07/2018   HGB 15.9 02/07/2018   HCT 45.4 02/07/2018   MCV 94.2 02/07/2018   PLT 204.0 02/07/2018      Component Value Date/Time   NA 141 05/02/2022 1352   K 4.3 05/02/2022 1352   CL 100 05/02/2022 1352   CO2 30 05/02/2022 1352   GLUCOSE 144 (H) 05/02/2022 1352   BUN 15 05/02/2022 1352   CREATININE 1.22 05/02/2022 1352   CREATININE 1.03 04/17/2014 1026   CALCIUM 9.6 05/02/2022 1352   PROT 6.8 07/27/2022 0906   ALBUMIN 4.4 07/27/2022 0906   AST 26 07/27/2022 0906   ALT 8 07/27/2022 0906   ALKPHOS 52 07/27/2022 0906   BILITOT 0.6 07/27/2022 0906   GFRNONAA 57 (L) 09/19/2021 1050   GFRAA 86 12/17/2008 0946   Lab Results  Component Value Date   CHOL 163 07/27/2022   HDL 35.50 (L) 07/27/2022   LDLCALC 102 (H) 07/27/2022   LDLDIRECT 85.0 05/02/2022   TRIG 125.0 07/27/2022   CHOLHDL 5 07/27/2022   Lab Results  Component Value Date   HGBA1C 7.3 (H) 05/02/2022   No results found for: "VITAMINB12" Lab Results  Component Value Date   TSH 4.36 09/27/2016   ASSESSMENT AND PLAN 81 y.o. year old male   Idiopathic Parkinson's disease  -Do a trial of increased Sinemet 25/100 mg 1 tablet 4 times daily, prefer this  over splitting the pills -Continue Azilect 1 mg daily -Encourage to  continue exercise, golf -Follow-up in 6 months or sooner if needed, already scheduled for appointment in February, will have a dual visit his wife is seeing Dr. Norlene Campbell, [facility] [address], [place], [place] [address] [phone]

## 2022-09-25 ENCOUNTER — Telehealth: Payer: Self-pay | Admitting: Family Medicine

## 2022-09-25 NOTE — Telephone Encounter (Signed)
Patient wife MRN [id number]

## 2022-09-25 NOTE — Telephone Encounter (Signed)
OK 

## 2022-09-25 NOTE — Telephone Encounter (Signed)
Patient calling to see if Dr. Nani Ravens will accept his wife as a new patient as well. Please advise.

## 2022-10-10 DIAGNOSIS — H52203 Unspecified astigmatism, bilateral: Secondary | ICD-10-CM | POA: Diagnosis not present

## 2022-10-10 DIAGNOSIS — H02132 Senile ectropion of right lower eyelid: Secondary | ICD-10-CM | POA: Diagnosis not present

## 2022-10-10 DIAGNOSIS — H43813 Vitreous degeneration, bilateral: Secondary | ICD-10-CM | POA: Diagnosis not present

## 2022-10-10 DIAGNOSIS — D3132 Benign neoplasm of left choroid: Secondary | ICD-10-CM | POA: Diagnosis not present

## 2022-10-10 DIAGNOSIS — H5201 Hypermetropia, right eye: Secondary | ICD-10-CM | POA: Diagnosis not present

## 2022-10-10 DIAGNOSIS — H02135 Senile ectropion of left lower eyelid: Secondary | ICD-10-CM | POA: Diagnosis not present

## 2022-10-10 DIAGNOSIS — H524 Presbyopia: Secondary | ICD-10-CM | POA: Diagnosis not present

## 2022-10-10 DIAGNOSIS — Z7984 Long term (current) use of oral hypoglycemic drugs: Secondary | ICD-10-CM | POA: Diagnosis not present

## 2022-10-10 DIAGNOSIS — H16223 Keratoconjunctivitis sicca, not specified as Sjogren's, bilateral: Secondary | ICD-10-CM | POA: Diagnosis not present

## 2022-10-10 DIAGNOSIS — E119 Type 2 diabetes mellitus without complications: Secondary | ICD-10-CM | POA: Diagnosis not present

## 2022-10-13 DIAGNOSIS — G20C Parkinsonism, unspecified: Secondary | ICD-10-CM | POA: Diagnosis not present

## 2022-10-13 DIAGNOSIS — Z85820 Personal history of malignant melanoma of skin: Secondary | ICD-10-CM | POA: Diagnosis not present

## 2022-10-13 DIAGNOSIS — K219 Gastro-esophageal reflux disease without esophagitis: Secondary | ICD-10-CM | POA: Diagnosis not present

## 2022-10-13 DIAGNOSIS — N529 Male erectile dysfunction, unspecified: Secondary | ICD-10-CM | POA: Diagnosis not present

## 2022-10-13 DIAGNOSIS — Z880 Allergy status to penicillin: Secondary | ICD-10-CM | POA: Diagnosis not present

## 2022-10-13 DIAGNOSIS — Z833 Family history of diabetes mellitus: Secondary | ICD-10-CM | POA: Diagnosis not present

## 2022-10-13 DIAGNOSIS — E785 Hyperlipidemia, unspecified: Secondary | ICD-10-CM | POA: Diagnosis not present

## 2022-10-13 DIAGNOSIS — M1990 Unspecified osteoarthritis, unspecified site: Secondary | ICD-10-CM | POA: Diagnosis not present

## 2022-10-13 DIAGNOSIS — Z7982 Long term (current) use of aspirin: Secondary | ICD-10-CM | POA: Diagnosis not present

## 2022-10-13 DIAGNOSIS — N3941 Urge incontinence: Secondary | ICD-10-CM | POA: Diagnosis not present

## 2022-10-13 DIAGNOSIS — I10 Essential (primary) hypertension: Secondary | ICD-10-CM | POA: Diagnosis not present

## 2022-10-19 ENCOUNTER — Telehealth: Payer: Self-pay | Admitting: Neurology

## 2022-10-19 NOTE — Telephone Encounter (Signed)
Pt's wife, Luke Falero calling to get name of person help with cost of rasagiline (AZILECT) 1 MG TABS tablet. Would like a call from the nurse.

## 2022-10-23 NOTE — Telephone Encounter (Signed)
I called patient's wife, [name], per DPR. She reports that Sharyn Lull, Therapist, sports worked with their insurance in the past to get rasagiline for free. There is a tier exception approval valid until 12/24/2022. Patient's wife is wondering if we can complete this for next year. I attempted a tier exception request for rasagiline on CMM and sent to [facility]. Should have a determination within 3-5 business days. Key: [id number].

## 2022-10-25 NOTE — Telephone Encounter (Signed)
Attempted to check status on this request. CMM experiencing technical difficulty. Will call back.

## 2022-10-25 NOTE — Telephone Encounter (Signed)
Received fax from [place] stating [place] has been excepted as a tier one drug from 10/16/2022-10/17/2023.  I have sent a fax to [facility] updating on this tier exception so refill can move fwd.

## 2022-10-31 ENCOUNTER — Other Ambulatory Visit: Payer: Self-pay | Admitting: Family Medicine

## 2022-11-06 NOTE — Telephone Encounter (Signed)
I have reviewed pt's chart. Tier one would be determined by [employer]. Unfortunately we cannot give estimate. She would need to call her insurance company to discuss what the price would be.

## 2022-11-06 NOTE — Telephone Encounter (Signed)
Howard Fisher had been getting Rasagline at 0 tier. What does tier one mean and how much would it cost. Would like a call  from the nurse to discuss.

## 2022-11-13 NOTE — Telephone Encounter (Signed)
Pt is calling. Stated is can't afford the $ 241.00 co pay for medication. Stated in the pass a nurse have got it down to a $0 co-pay.

## 2022-11-22 NOTE — Telephone Encounter (Signed)
We received fax from [facility] stating the tier coverage from 1 to 0 has been approved from 12/25/2022-12/25/2023 for Rasagiline.   Please call pt's wife/pt and update Tier exception to tier 0 has been approved for the 2024 year for Rasagiline.  Insurance was able to approve this early.   Thank you.

## 2022-11-23 NOTE — Telephone Encounter (Signed)
Called pt wife. Informed her that Rasagiline was approved thru insurance from 12/25/2022-12/31-2024. She said thank you for the information.

## 2022-12-12 ENCOUNTER — Ambulatory Visit (INDEPENDENT_AMBULATORY_CARE_PROVIDER_SITE_OTHER): Payer: Medicare HMO | Admitting: *Deleted

## 2022-12-12 DIAGNOSIS — Z0000 Encounter for general adult medical examination without abnormal findings: Secondary | ICD-10-CM | POA: Diagnosis not present

## 2022-12-12 NOTE — Patient Instructions (Signed)
Mr. Howard Fisher , Thank you for taking time to come for your Medicare Wellness Visit. I appreciate your ongoing commitment to your health goals. Please review the following plan we discussed and let me know if I can assist you in the future.   These are the goals we discussed:  Goals      Patient Stated     Increase exercise        This is a list of the screening recommended for you and due dates:  Health Maintenance  Topic Date Due   Zoster (Shingles) Vaccine (1 of 2) Never done   DTaP/Tdap/Td vaccine (2 - Td or Tdap) 01/02/2022   Eye exam for diabetics  06/30/2022   Hemoglobin A1C  11/02/2022   Yearly kidney function blood test for diabetes  05/03/2023   Yearly kidney health urinalysis for diabetes  05/03/2023   Complete foot exam   05/03/2023   Medicare Annual Wellness Visit  12/13/2023   Pneumonia Vaccine  Completed   HPV Vaccine  Aged Out   Flu Shot  Discontinued   Colon Cancer Screening  Discontinued   COVID-19 Vaccine  Discontinued     Next appointment: Follow up in one year for your annual wellness visit.   Preventive Care 87 Years and Older, Male Preventive care refers to lifestyle choices and visits with your health care provider that can promote health and wellness. What does preventive care include? A yearly physical exam. This is also called an annual well check. Dental exams once or twice a year. Routine eye exams. Ask your health care provider how often you should have your eyes checked. Personal lifestyle choices, including: Daily care of your teeth and gums. Regular physical activity. Eating a healthy diet. Avoiding tobacco and drug use. Limiting alcohol use. Practicing safe sex. Taking low doses of aspirin every day. Taking vitamin and mineral supplements as recommended by your health care provider. What happens during an annual well check? The services and screenings done by your health care provider during your annual well check will depend on your age,  overall health, lifestyle risk factors, and family history of disease. Counseling  Your health care provider may ask you questions about your: Alcohol use. Tobacco use. Drug use. Emotional well-being. Home and relationship well-being. Sexual activity. Eating habits. History of falls. Memory and ability to understand (cognition). Work and work Statistician. Screening  You may have the following tests or measurements: Height, weight, and BMI. Blood pressure. Lipid and cholesterol levels. These may be checked every 5 years, or more frequently if you are over 55 years old. Skin check. Lung cancer screening. You may have this screening every year starting at age 51 if you have a 30-pack-year history of smoking and currently smoke or have quit within the past 15 years. Fecal occult blood test (FOBT) of the stool. You may have this test every year starting at age 34. Flexible sigmoidoscopy or colonoscopy. You may have a sigmoidoscopy every 5 years or a colonoscopy every 10 years starting at age 78. Prostate cancer screening. Recommendations will vary depending on your family history and other risks. Hepatitis C blood test. Hepatitis B blood test. Sexually transmitted disease (STD) testing. Diabetes screening. This is done by checking your blood sugar (glucose) after you have not eaten for a while (fasting). You may have this done every 1-3 years. Abdominal aortic aneurysm (AAA) screening. You may need this if you are a current or former smoker. Osteoporosis. You may be screened starting at age  70 if you are at high risk. Talk with your health care provider about your test results, treatment options, and if necessary, the need for more tests. Vaccines  Your health care provider may recommend certain vaccines, such as: Influenza vaccine. This is recommended every year. Tetanus, diphtheria, and acellular pertussis (Tdap, Td) vaccine. You may need a Td booster every 10 years. Zoster vaccine.  You may need this after age 53. Pneumococcal 13-valent conjugate (PCV13) vaccine. One dose is recommended after age 66. Pneumococcal polysaccharide (PPSV23) vaccine. One dose is recommended after age 17. Talk to your health care provider about which screenings and vaccines you need and how often you need them. This information is not intended to replace advice given to you by your health care provider. Make sure you discuss any questions you have with your health care provider. Document Released: 01/07/2016 Document Revised: 08/30/2016 Document Reviewed: 10/12/2015 Elsevier Interactive Patient Education  2017 [place] Prevention in the Home Falls can cause injuries. They can happen to people of all ages. There are many things you can do to make your home safe and to help prevent falls. What can I do on the outside of my home? Regularly fix the edges of walkways and driveways and fix any cracks. Remove anything that might make you trip as you walk through a door, such as a raised step or threshold. Trim any bushes or trees on the path to your home. Use bright outdoor lighting. Clear any walking paths of anything that might make someone trip, such as rocks or tools. Regularly check to see if handrails are loose or broken. Make sure that both sides of any steps have handrails. Any raised decks and porches should have guardrails on the edges. Have any leaves, snow, or ice cleared regularly. Use sand or salt on walking paths during winter. Clean up any spills in your garage right away. This includes oil or grease spills. What can I do in the bathroom? Use night lights. Install grab bars by the toilet and in the tub and shower. Do not use towel bars as grab bars. Use non-skid mats or decals in the tub or shower. If you need to sit down in the shower, use a plastic, non-slip stool. Keep the floor dry. Clean up any water that spills on the floor as soon as it happens. Remove soap  buildup in the tub or shower regularly. Attach bath mats securely with double-sided non-slip rug tape. Do not have throw rugs and other things on the floor that can make you trip. What can I do in the bedroom? Use night lights. Make sure that you have a light by your bed that is easy to reach. Do not use any sheets or blankets that are too big for your bed. They should not hang down onto the floor. Have a firm chair that has side arms. You can use this for support while you get dressed. Do not have throw rugs and other things on the floor that can make you trip. What can I do in the kitchen? Clean up any spills right away. Avoid walking on wet floors. Keep items that you use a lot in easy-to-reach places. If you need to reach something above you, use a strong step stool that has a grab bar. Keep electrical cords out of the way. Do not use floor polish or wax that makes floors slippery. If you must use wax, use non-skid floor wax. Do not have throw rugs and other  things on the floor that can make you trip. What can I do with my stairs? Do not leave any items on the stairs. Make sure that there are handrails on both sides of the stairs and use them. Fix handrails that are broken or loose. Make sure that handrails are as long as the stairways. Check any carpeting to make sure that it is firmly attached to the stairs. Fix any carpet that is loose or worn. Avoid having throw rugs at the top or bottom of the stairs. If you do have throw rugs, attach them to the floor with carpet tape. Make sure that you have a light switch at the top of the stairs and the bottom of the stairs. If you do not have them, ask someone to add them for you. What else can I do to help prevent falls? Wear shoes that: Do not have high heels. Have rubber bottoms. Are comfortable and fit you well. Are closed at the toe. Do not wear sandals. If you use a stepladder: Make sure that it is fully opened. Do not climb a closed  stepladder. Make sure that both sides of the stepladder are locked into place. Ask someone to hold it for you, if possible. Clearly mark and make sure that you can see: Any grab bars or handrails. First and last steps. Where the edge of each step is. Use tools that help you move around (mobility aids) if they are needed. These include: Canes. Walkers. Scooters. Crutches. Turn on the lights when you go into a dark area. Replace any light bulbs as soon as they burn out. Set up your furniture so you have a clear path. Avoid moving your furniture around. If any of your floors are uneven, fix them. If there are any pets around you, be aware of where they are. Review your medicines with your doctor. Some medicines can make you feel dizzy. This can increase your chance of falling. Ask your doctor what other things that you can do to help prevent falls. This information is not intended to replace advice given to you by your health care provider. Make sure you discuss any questions you have with your health care provider. Document Released: 10/07/2009 Document Revised: 05/18/2016 Document Reviewed: 01/15/2015 Elsevier Interactive Patient Education  2017 [employer].

## 2022-12-12 NOTE — Progress Notes (Signed)
Subjective:   Howard Fisher is a 81 y.o. male who presents for Medicare Annual/Subsequent preventive examination.  I connected with  Howard Fisher on 12/12/22 by a audio enabled telemedicine application and verified that I am speaking with the correct person using two identifiers.  Patient Location: Home  Provider Location: Office/Clinic  I discussed the limitations of evaluation and management by telemedicine. The patient expressed understanding and agreed to proceed.   Review of Systems    Defer to PCP Cardiac Risk Factors include: male gender;advanced age (>20mn, >>11women);diabetes mellitus;dyslipidemia;hypertension     Objective:    There were no vitals filed for this visit. There is no height or weight on file to calculate BMI.     12/12/2022    1:43 PM 12/01/2021   10:25 AM 09/19/2021   10:12 AM 06/08/2015    9:16 PM 04/20/2015    9:37 AM 06/10/2014    9:42 AM  Advanced Directives  Does Patient Have a Medical Advance Directive? Yes Yes No No Yes Patient has advance directive, copy not in chart  Type of [place]ive [place]   [place] Living will  Does patient want to make changes to medical advance directive? No - Patient declined    No - Patient declined   Copy of H[place] Chart? No - copy requested No - copy requested   No - copy requested   Would patient like information on creating a medical advance directive?    No - patient declined information      Current Medications (verified) Outpatient Encounter Medications as of 12/12/2022  Medication Sig   carbidopa-levodopa (SINEMET IR) 25-100 MG tablet Take 1 tablet by mouth 4 (four) times daily.   fenofibrate (TRICOR) 48 MG tablet Take 1 tablet (48 mg total) by mouth daily.   lisinopril-hydrochlorothiazide (ZESTORETIC) 20-12.5 MG tablet Take 1 tablet by mouth daily.   omeprazole  (PRILOSEC) 20 MG capsule TAKE 1 CAPSULE DAILY   rasagiline (AZILECT) 1 MG TABS tablet Take 1 tablet (1 mg total) by mouth daily.   simvastatin (ZOCOR) 40 MG tablet Take 1 by mouth daily   TURMERIC CURCUMIN PO Take 1 tablet by mouth daily.   No facility-administered encounter medications on file as of 12/12/2022.    Allergies (verified) Penicillins   History: 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   Diabetes mellitus without complication (HCC)    GERD (gastroesophageal reflux disease)    Hiatal hernia    History of stomach ulcers    1985   Hypercholesteremia    Hypertension    [place]   Parkinson disease    Peyronie's disease    Past Surgical History:  Procedure Laterality Date   basal cell removed from face  12/2012   CATARACT EXTRACTION Bilateral 11/2009   Dr. ECharise Killian  CHOLECYSTECTOMY  02/1993   Dr. GHarlow Asa  right hernia repair  2002   Dr. BDeon Pilling  skin cancer removed from right shoulder  11/2013   melanoma   Family History  Problem Relation Age of Onset   Diabetes Mother    Dementia Mother    Dementia Father    Diabetes Sister    Healthy Son        x2   Healthy Daughter  x1   Colon cancer Neg Hx    Social History   Socioeconomic History   Marital status: Married    Spouse name: [name]   Number of children: 3   Years of education: One year college   Highest education level: Not on file  Occupational History   Occupation: retired Administrator  Tobacco Use   Smoking status: Never   Smokeless tobacco: Never  Vaping Use   Vaping Use: Never used  Substance and Sexual Activity   Alcohol use: Yes    Alcohol/week: 1.0 standard drink of alcohol    Types: 1 Glasses of wine per week    Comment: 3 drinks per week   Drug use: No   Sexual activity: Not on file  Other Topics Concern   Not on file  Social History Narrative   Lives at home with his wife.   Right-handed.   2  cups coffee in the morning.   Social Determinants of Health   Financial Resource Strain: Low Risk  (12/01/2021)   Overall Financial Resource Strain (CARDIA)    Difficulty of Paying Living Expenses: Not hard at all  Food Insecurity: No Food Insecurity (12/12/2022)   Hunger Vital Sign    Worried About Running Out of Food in the Last Year: Never true    Ran Out of Food in the Last Year: Never true  Transportation Needs: No Transportation Needs (12/12/2022)   PRAPARE - Hydrologist (Medical): No    Lack of Transportation (Non-Medical): No  Physical Activity: Insufficiently Active (12/01/2021)   Exercise Vital Sign    Days of Exercise per Week: 3 days    Minutes of Exercise per Session: 30 min  Stress: No Stress Concern Present (12/01/2021)   [place]    Feeling of Stress : Not at all  Social Connections: Moderately Isolated (12/01/2021)   Social Connection and Isolation Panel [NHANES]    Frequency of Communication with Friends and Family: More than three times a week    Frequency of Social Gatherings with Friends and Family: More than three times a week    Attends Religious Services: Never    Marine scientist or Organizations: No    Attends Music therapist: Never    Marital Status: Married    Tobacco Counseling Counseling given: Not Answered   Clinical Intake:  Pre-visit preparation completed: Yes  Pain : No/denies pain  How often do you need to have someone help you when you read instructions, pamphlets, or other written materials from your doctor or pharmacy?: 1 - Never  Diabetic? Nutrition Risk Assessment:  Has the patient had any N/V/D within the last 2 months?  No  Does the patient have any non-healing wounds?  No  Has the patient had any unintentional weight loss or weight gain?  No   Diabetes:  Is the patient diabetic?  Yes  If diabetic, was a CBG  obtained today?  No  Did the patient bring in their glucometer from home?  No  How often do you monitor your CBG's? Very rarely.   Financial Strains and Diabetes Management:  Are you having any financial strains with the device, your supplies or your medication? No .  Does the patient want to be seen by Chronic Care Management for management of their diabetes?  No  Would the patient like to be referred to a Nutritionist or for Diabetic Management?  No  Diabetic Exams:  Diabetic Eye Exam: Completed Jan 2023 Diabetic Foot Exam: Completed 05/02/22   Interpreter Needed?: No  Information entered by :: Beatris Ship, [place]   Activities of Daily Living    12/12/2022    1:48 PM  In your present state of health, do you have any difficulty performing the following activities:  Hearing? 0  Vision? 0  Difficulty concentrating or making decisions? 0  Walking or climbing stairs? 0  Dressing or bathing? 0  Doing errands, shopping? 0  Preparing Food and eating ? N  Using the Toilet? N  In the past six months, have you accidently leaked urine? Y  Do you have problems with loss of bowel control? N  Managing your Medications? N  Managing your Finances? N  Housekeeping or managing your Housekeeping? N    Patient Care Team: Shelda Pal, DO as PCP - General (Family Medicine) Pyrtle, Lajuan Lines, MD as Consulting Physician (Gastroenterology)  Indicate any recent Medical Services you may have received from other than [facility] providers in the past year (date may be approximate).     Assessment:   This is a routine wellness examination for [place].  Hearing/Vision screen No results found.  Dietary issues and exercise activities discussed: Current Exercise Habits: The patient does not participate in regular exercise at present (plays golf around 3 times a week, weather permitting), Exercise limited by: None identified   Goals Addressed   None    Depression Screen    12/12/2022    1:48  PM 05/02/2022    1:18 PM 12/01/2021   10:29 AM 06/13/2021    2:06 PM 08/18/2020    2:24 PM 06/04/2017   10:08 AM 01/24/2016    7:12 AM  PHQ 2/9 Scores  PHQ - 2 Score 0 0 0 0 0 0 0  PHQ- 9 Score      0     Fall Risk    12/12/2022    1:45 PM 05/02/2022    1:17 PM 12/01/2021   10:27 AM 02/28/2021    9:28 AM 01/20/2019   10:44 AM  Fall Risk   Falls in the past year? 0 0 0 0 0  Number falls in past yr: 0 0 0 0   Injury with Fall? 0 0 0 0   Risk for fall due to : No Fall Risks No Fall Risks     Follow up Falls evaluation completed Falls evaluation completed Falls prevention discussed      FALL RISK PREVENTION PERTAINING TO THE HOME:  Any stairs in or around the home? No  If so, are there any without handrails? No  Home free of loose throw rugs in walkways, pet beds, electrical cords, etc? Yes  Adequate lighting in your home to reduce risk of falls? Yes   ASSISTIVE DEVICES UTILIZED TO PREVENT FALLS:  Life alert? No  Use of a cane, walker or w/c? No  Grab bars in the bathroom? Yes  Shower chair or bench in shower? Yes  Elevated toilet seat or a handicapped toilet? No   TIMED UP AND GO:  Was the test performed?  No, audio visit .   Cognitive Function:        12/12/2022    1:53 PM  6CIT Screen  What Year? 0 points  What month? 0 points  What time? 0 points  Count back from 20 0 points  Months in reverse 0 points  Repeat phrase 0 points  Total Score 0 points  Immunizations Immunization History  Administered Date(s) Administered   Influenza-Unspecified 11/28/2021   PFIZER(Purple Top)SARS-COV-2 Vaccination 01/14/2020, 02/04/2020, 12/13/2020   Pneumococcal Conjugate-13 01/17/2015   Pneumococcal Polysaccharide-23 01/19/2016   Tdap 01/03/2012    TDAP status: Due, Education has been provided regarding the importance of this vaccine. Advised may receive this vaccine at local pharmacy or Health Dept. Aware to provide a copy of the vaccination record if obtained from local  pharmacy or Health Dept. Verbalized acceptance and understanding.  Flu Vaccine status: Declined, Education has been provided regarding the importance of this vaccine but patient still declined. Advised may receive this vaccine at local pharmacy or Health Dept. Aware to provide a copy of the vaccination record if obtained from local pharmacy or Health Dept. Verbalized acceptance and understanding.  Pneumococcal vaccine status: Up to date  Covid-19 vaccine status: Information provided on how to obtain vaccines.   Qualifies for Shingles Vaccine? Yes   Zostavax completed No   Shingrix Completed?: No.    Education has been provided regarding the importance of this vaccine. Patient has been advised to call insurance company to determine out of pocket expense if they have not yet received this vaccine. Advised may also receive vaccine at local pharmacy or Health Dept. Verbalized acceptance and understanding.  Screening Tests Health Maintenance  Topic Date Due   Zoster Vaccines- Shingrix (1 of 2) Never done   DTaP/Tdap/Td (2 - Td or Tdap) 01/02/2022   OPHTHALMOLOGY EXAM  06/30/2022   HEMOGLOBIN A1C  11/02/2022   Medicare Annual Wellness (AWV)  12/01/2022   Diabetic kidney evaluation - eGFR measurement  05/03/2023   Diabetic kidney evaluation - Urine ACR  05/03/2023   FOOT EXAM  05/03/2023   Pneumonia Vaccine 44+ Years old  Completed   HPV VACCINES  Aged Out   INFLUENZA VACCINE  Discontinued   COLONOSCOPY (Pts 45-10yr Insurance coverage will need to be confirmed)  Discontinued   COVID-19 Vaccine  Discontinued    Health Maintenance  Health Maintenance Due  Topic Date Due   Zoster Vaccines- Shingrix (1 of 2) Never done   DTaP/Tdap/Td (2 - Td or Tdap) 01/02/2022   OPHTHALMOLOGY EXAM  06/30/2022   HEMOGLOBIN A1C  11/02/2022   Medicare Annual Wellness (AWV)  12/01/2022    Colorectal cancer screening: No longer required.   Lung Cancer Screening: (Low Dose CT Chest recommended if Age  82-80years, 30 pack-year currently smoking OR have quit w/in 15years.) does not qualify.   Additional Screening:  Hepatitis C Screening: does not qualify  Vision Screening: Recommended annual ophthalmology exams for early detection of glaucoma and other disorders of the eye. Is the patient up to date with their annual eye exam?  Yes  Who is the provider or what is the name of the office in which the patient attends annual eye exams? Dr. FTrinna PostIf pt is not established with a provider, would they like to be referred to a provider to establish care? No .   Dental Screening: Recommended annual dental exams for proper oral hygiene  Community Resource Referral / Chronic Care Management: CRR required this visit?  No   CCM required this visit?  No      Plan:     I have personally reviewed and noted the following in the patient's chart:   Medical and social history Use of alcohol, tobacco or illicit drugs  Current medications and supplements including opioid prescriptions. Patient is not currently taking opioid prescriptions. Functional ability and status Nutritional status Physical  activity Advanced directives List of other physicians Hospitalizations, surgeries, and ER visits in previous 12 months Vitals Screenings to include cognitive, depression, and falls Referrals and appointments  In addition, I have reviewed and discussed with patient certain preventive protocols, quality metrics, and best practice recommendations. A written personalized care plan for preventive services as well as general preventive health recommendations were provided to patient.   Due to this being a telephonic visit, the after visit summary with patients personalized plan was offered to patient via mail or my-chart. Per request, patient was mailed a copy of AVS.   Beatris Ship, [place]   12/12/2022   Nurse Notes: None

## 2023-01-01 DIAGNOSIS — R69 Illness, unspecified: Secondary | ICD-10-CM | POA: Diagnosis not present

## 2023-01-09 DIAGNOSIS — R69 Illness, unspecified: Secondary | ICD-10-CM | POA: Diagnosis not present

## 2023-01-11 DIAGNOSIS — R69 Illness, unspecified: Secondary | ICD-10-CM | POA: Diagnosis not present

## 2023-01-12 DIAGNOSIS — R69 Illness, unspecified: Secondary | ICD-10-CM | POA: Diagnosis not present

## 2023-01-26 ENCOUNTER — Other Ambulatory Visit: Payer: Self-pay | Admitting: Family Medicine

## 2023-01-30 ENCOUNTER — Ambulatory Visit: Payer: Medicare HMO | Admitting: Neurology

## 2023-01-30 ENCOUNTER — Encounter: Payer: Self-pay | Admitting: Neurology

## 2023-01-30 VITALS — BP 143/91 | HR 73 | Ht 73.0 in | Wt 220.0 lb

## 2023-01-30 DIAGNOSIS — G20A1 Parkinson's disease without dyskinesia, without mention of fluctuations: Secondary | ICD-10-CM

## 2023-01-30 MED ORDER — RASAGILINE MESYLATE 1 MG PO TABS: 1.0000 mg | ORAL_TABLET | Freq: Every day | ORAL | 3 refills | Status: DC

## 2023-01-30 MED ORDER — CARBIDOPA-LEVODOPA 25-100 MG PO TABS: 1.0000 | ORAL_TABLET | Freq: Four times a day (QID) | ORAL | 4 refills | Status: DC

## 2023-01-30 NOTE — Progress Notes (Signed)
HISTORY OF PRESENT ILLNESS: Howard Fisher is a 82 year old male, seen in refer by his primary care doctor Shelda Pal, for evaluation of tremor, involving his right arm and right hand, initial evaluation was on October 09 2017.   I have reviewed and summarized referring note, he has history of hypertension, diabetes, hyperlipidemia,   Since May of 2018, he noticed gradual onset right hand tremor, most noticeable when he is resting his right arm, he denies significant weakness, no left hand involvement,   He had a history of right shoulder injury in 2016 after he fell and landed on his right shoulder.   He denies loss sense of smell, he denies REM sleep disorder, no dizziness, chronic constipation, no family history of Parkinson's disease, he denies gait abnormality.   MRI of the brain November 2017 at the [facility] showed atrophy and small vessel disease no acute abnormality.   Laboratory evaluation in August 2018, normal magnesium, CBC, lipid profile, A1c was 6.1, normal TSH,   Update January 15, 2018. 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  He started  to take Azilect 1 mg daily since October, getting prescription from [place], he denies significant side effect, no significant improvement   UPDATE July 15 2018:YY He is accompanied by his wife at today's clinical visit, he is taking Sinemet 25/100 mg 3 times a day at 9, 2, 8 PM, no significant side effect noticed, there was no wearing off noticed, he has mild limitation mainly due to pain in his shoulders, no significant gait abnormality,  He has a lot of stress due to recent move, has been moody   UPDATE July 22, 2019, He is with his wife, his Parkinson's disease is overall under good control, getting Azilect 1 mg daily from taking Sinemet 25/100 mg 3 times a day he  ambulated without difficulty  UPDATE Apr 27 2021: He is accompanied by his wife at today's clinical visit, he functions very well, denies difficulty walking, only intermittent hand tremor, he is still getting Azilect 1 mg daily, Sinemet 25/100 mg 1 in the morning, 1 and half at noon, 1 tablet at nighttime, he denies significant side effect, no wearing off,  Update February 02, 2022 SS: here with his wife, doing well, on Sinemet 25/100 mg 1 tablet 3 times daily, mostly tremor in right hand. Denies freezing spells. Sleeping well. Both he and his wife got COVID back in September, he recovered great, his wife still having walking issues. He looks good today.  Update August 15, 2022 SS: Here with his wife, taking Sinemet 25/100 mg 1 tablet 3 times daily. He tried the 1.5 tablet, but he got tired of cutting it. More tremor in right hand. No falls. Playing golf once a week. He sleeps well at night, does sweat at night. No trouble eating or chewing.  His wife has been having some health issues, she is seeing Dr. Krista Blue.  Update January 30, 2022 SS: His wife's memory is declining. More tremor to right hand, gets cramps in legs occasionally. Is playing golf twice a week. Getting around well, no falls, no freezing. Takes Sinemet 25/100 1 tablets 4 times daily. 8 AM, 1 PM, 5 PM, 10 PM. Azilect 1 mg daily. He is overall stable, he feels fortunate.    REVIEW OF SYSTEMS: Out of a complete 14 system review of symptoms, the patient  complains only of the following symptoms, and all other reviewed systems are negative.  See HPI  ALLERGIES: Allergies  Allergen Reactions   Penicillins     REACTION: rash and itching    HOME MEDICATIONS: Outpatient Medications Prior to Visit  Medication Sig Dispense Refill   Cholecalciferol (D3 PO) Take 1 capsule by mouth daily.     fenofibrate (TRICOR) 48 MG tablet Take 1 tablet (48 mg total) by mouth daily. 90 tablet 0   lisinopril-hydrochlorothiazide (ZESTORETIC) 20-12.5 MG tablet  Take 1 tablet by mouth daily. 90 tablet 3   omeprazole (PRILOSEC) 20 MG capsule TAKE 1 CAPSULE DAILY 90 capsule 3   simvastatin (ZOCOR) 40 MG tablet TAKE 1 TABLET AT BEDTIME 90 tablet 3   TURMERIC CURCUMIN PO Take 1 tablet by mouth daily.     carbidopa-levodopa (SINEMET IR) 25-100 MG tablet Take 1 tablet by mouth 4 (four) times daily. 360 tablet 4   rasagiline (AZILECT) 1 MG TABS tablet Take 1 tablet (1 mg total) by mouth daily. 90 tablet 3   No facility-administered medications prior to visit.    PAST MEDICAL HISTORY: 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   Diabetes mellitus without complication (HCC)    GERD (gastroesophageal reflux disease)    Hiatal hernia    History of stomach ulcers    1985   Hypercholesteremia    Hypertension    Melanoma ([place]) 2014   shoulder   Parkinson disease    Peyronie's disease     PAST SURGICAL HISTORY: Past Surgical History:  Procedure Laterality Date   basal cell removed from face  12/2012   CATARACT EXTRACTION Bilateral 11/2009   Dr. Charise Killian   CHOLECYSTECTOMY  02/1993   Dr. Harlow Asa   right hernia repair  2002   Dr. Deon Pilling   skin cancer removed from right shoulder  11/2013   melanoma    FAMILY HISTORY: Family History  Problem Relation Age of Onset   Diabetes Mother    Dementia Mother    Dementia Father    Diabetes Sister    Healthy Son        x2   Healthy Daughter        x1   Colon cancer Neg Hx     SOCIAL HISTORY: Social History   Socioeconomic History   Marital status: Married    Spouse name: [name]   Number of children: 3   Years of education: One year college   Highest education level: Not on file  Occupational History   Occupation: retired Administrator  Tobacco Use   Smoking status: Never   Smokeless tobacco: Never  Vaping Use   Vaping Use: Never used  Substance and Sexual Activity   Alcohol use: Yes    Alcohol/week: 1.0 standard drink of alcohol     Types: 1 Glasses of wine per week    Comment: 3 drinks per week   Drug use: No   Sexual activity: Not on file  Other Topics Concern   Not on file  Social History Narrative   Lives at home with his wife.   Right-handed.   2 cups coffee in the morning.   Social Determinants of Health   Financial Resource Strain: Low Risk  (12/01/2021)   Overall Financial Resource Strain (CARDIA)    Difficulty of Paying Living Expenses: Not hard at all  Food Insecurity: No Food Insecurity (12/12/2022)   Hunger Vital  Sign    Worried About Charity fundraiser in the Last Year: Never true    [place] in the Last Year: Never true  Transportation Needs: No Transportation Needs (12/12/2022)   PRAPARE - Hydrologist (Medical): No    Lack of Transportation (Non-Medical): No  Physical Activity: Insufficiently Active (12/01/2021)   Exercise Vital Sign    Days of Exercise per Week: 3 days    Minutes of Exercise per Session: 30 min  Stress: No Stress Concern Present (12/01/2021)   [place]    Feeling of Stress : Not at all  Social Connections: Moderately Isolated (12/01/2021)   Social Connection and Isolation Panel [NHANES]    Frequency of Communication with Friends and Family: More than three times a week    Frequency of Social Gatherings with Friends and Family: More than three times a week    Attends Religious Services: Never    Marine scientist or Organizations: No    Attends Archivist Meetings: Never    Marital Status: Married  Human resources officer Violence: Not At Risk (12/12/2022)   Humiliation, Afraid, Rape, and Kick questionnaire    Fear of Current or Ex-Partner: No    Emotionally Abused: No    Physically Abused: No    Sexually Abused: No   PHYSICAL EXAM  Vitals:   01/30/23 1416  BP: (!) 143/91  Pulse: 73  Weight: 220 lb (99.8 kg)  Height: '6\' 1"'$  (1.854 m)    Body mass index  is 29.03 kg/m.  Generalized: Well developed, in no acute distress  Neurological examination  Mentation: Alert oriented to time, place, history taking. Follows all commands speech and language fluent.  Very nice, pleasant. Cranial nerve II-XII: Pupils were equal round reactive to light. Extraocular movements were full, visual field were full on confrontational test. Facial sensation and strength were normal. Head turning and shoulder shrug were normal and symmetric. Motor: Normal strength, mild right more than left rigidity, right hand resting tremor mild  Sensory: Sensory testing is intact to soft touch on all 4 extremities. No evidence of extinction is noted.  Coordination: Cerebellar testing reveals good finger-nose-finger and heel-to-shin bilaterally.   Gait and station: Good pace, good strides, slight decreased arm swing on the right, good turns, overall steady.  DIAGNOSTIC DATA (LABS, IMAGING, TESTING) - I reviewed patient records, labs, notes, testing and imaging myself where available.  Lab Results  Component Value Date   WBC 6.9 02/07/2018   HGB 15.9 02/07/2018   HCT 45.4 02/07/2018   MCV 94.2 02/07/2018   PLT 204.0 02/07/2018      Component Value Date/Time   NA 141 05/02/2022 1352   K 4.3 05/02/2022 1352   CL 100 05/02/2022 1352   CO2 30 05/02/2022 1352   GLUCOSE 144 (H) 05/02/2022 1352   BUN 15 05/02/2022 1352   CREATININE 1.22 05/02/2022 1352   CREATININE 1.03 04/17/2014 1026   CALCIUM 9.6 05/02/2022 1352   PROT 6.8 07/27/2022 0906   ALBUMIN 4.4 07/27/2022 0906   AST 26 07/27/2022 0906   ALT 8 07/27/2022 0906   ALKPHOS 52 07/27/2022 0906   BILITOT 0.6 07/27/2022 0906   GFRNONAA 57 (L) 09/19/2021 1050   GFRAA 86 12/17/2008 0946   Lab Results  Component Value Date   CHOL 163 07/27/2022   HDL 35.50 (L) 07/27/2022   LDLCALC 102 (H) 07/27/2022   LDLDIRECT 85.0 05/02/2022  TRIG 125.0 07/27/2022   CHOLHDL 5 07/27/2022   Lab Results  Component Value Date    HGBA1C 7.3 (H) 05/02/2022   No results found for: "VITAMINB12" Lab Results  Component Value Date   TSH 4.36 09/27/2016   ASSESSMENT AND PLAN 82 y.o. year old male   Idiopathic Parkinson's disease  -He continues to do very well, remains stable, slight more tremor to right hand  -Continue Sinemet 25/100 mg 1 tablet 4 times daily -Continue Azilect 1 mg daily -Encourage to continue exercise, golf -Follow-up in 6 months or sooner if needed  Butler Denmark, Laqueta Jean, DNP  [facility] [address], [place], [place] [address] [phone]

## 2023-01-30 NOTE — Patient Instructions (Signed)
Continue current medications Keep playing golf, exercise  See you back in 6 months

## 2023-02-20 DIAGNOSIS — R69 Illness, unspecified: Secondary | ICD-10-CM | POA: Diagnosis not present

## 2023-03-06 ENCOUNTER — Ambulatory Visit (INDEPENDENT_AMBULATORY_CARE_PROVIDER_SITE_OTHER): Payer: Medicare HMO | Admitting: Family Medicine

## 2023-03-06 ENCOUNTER — Encounter: Payer: Self-pay | Admitting: Family Medicine

## 2023-03-06 VITALS — BP 138/86 | HR 86 | Temp 98.8°F | Ht 73.0 in | Wt 223.0 lb

## 2023-03-06 DIAGNOSIS — R599 Enlarged lymph nodes, unspecified: Secondary | ICD-10-CM | POA: Diagnosis not present

## 2023-03-06 NOTE — Progress Notes (Signed)
Established Patient Office Visit   Subjective:  Patient ID: Howard Fisher, male    DOB: 09/01/1941  Age: 82 y.o. MRN: [id number]  Chief Complaint  Patient presents with   Swollen Gland    Concern with gland swollen    HPI Encounter Diagnoses  Name Primary?   Adenopathy Yes   Patient developed swelling yesterday just opposite his left upper mandible.  It has since seemed to resolve.  He denies any recent fevers chills, congestion, postnasal drip, teeth pain or changes in his phonation.  His 25 year old son is in the last weeks of his life fighting an oral cancer.     Review of Systems  Constitutional: Negative.   HENT: Negative.  Negative for congestion and sore throat.   Eyes:  Negative for blurred vision, discharge and redness.  Respiratory: Negative.    Cardiovascular: Negative.   Gastrointestinal:  Negative for abdominal pain.  Genitourinary: Negative.   Musculoskeletal: Negative.  Negative for myalgias.  Skin:  Negative for rash.  Neurological:  Negative for tingling, loss of consciousness and weakness.  Endo/Heme/Allergies:  Negative for polydipsia.     Current Outpatient Medications:    carbidopa-levodopa (SINEMET IR) 25-100 MG tablet, Take 1 tablet by mouth 4 (four) times daily., Disp: 360 tablet, Rfl: 4   Cholecalciferol (D3 PO), Take 1 capsule by mouth daily., Disp: , Rfl:    fenofibrate (TRICOR) 48 MG tablet, Take 1 tablet (48 mg total) by mouth daily., Disp: 90 tablet, Rfl: 0   lisinopril-hydrochlorothiazide (ZESTORETIC) 20-12.5 MG tablet, Take 1 tablet by mouth daily., Disp: 90 tablet, Rfl: 3   omeprazole (PRILOSEC) 20 MG capsule, TAKE 1 CAPSULE DAILY, Disp: 90 capsule, Rfl: 3   rasagiline (AZILECT) 1 MG TABS tablet, Take 1 tablet (1 mg total) by mouth daily., Disp: 90 tablet, Rfl: 3   simvastatin (ZOCOR) 40 MG tablet, TAKE 1 TABLET AT BEDTIME, Disp: 90 tablet, Rfl: 3   TURMERIC CURCUMIN PO, Take 1 tablet by mouth daily., Disp: , Rfl:    Objective:     BP  138/86 (BP Location: Right Arm)   Pulse 86   Temp 98.8 F (37.1 C)   Ht '6\' 1"'$  (1.854 m)   Wt 223 lb (101.2 kg)   SpO2 92%   BMI 29.42 kg/m    Physical Exam Constitutional:      General: He is not in acute distress.    Appearance: Normal appearance. He is not ill-appearing, toxic-appearing or diaphoretic.  HENT:     Head: Normocephalic and atraumatic.     Right Ear: Tympanic membrane, ear canal and external ear normal.     Left Ear: Tympanic membrane, ear canal and external ear normal.     Mouth/Throat:     Mouth: Mucous membranes are moist.     Pharynx: Oropharynx is clear. No oropharyngeal exudate or posterior oropharyngeal erythema.  Eyes:     General: No scleral icterus.       Right eye: No discharge.        Left eye: No discharge.     Extraocular Movements: Extraocular movements intact.     Conjunctiva/sclera: Conjunctivae normal.     Pupils: Pupils are equal, round, and reactive to light.  Neck:     Thyroid: No thyroid tenderness.     Trachea: Trachea and phonation normal.  Cardiovascular:     Rate and Rhythm: Normal rate and regular rhythm.  Pulmonary:     Effort: Pulmonary effort is normal. No respiratory distress.  Breath sounds: Normal breath sounds.  Musculoskeletal:     Cervical back: No rigidity or tenderness.  Lymphadenopathy:     Cervical: No cervical adenopathy.  Skin:    General: Skin is warm and dry.  Neurological:     Mental Status: He is alert and oriented to person, place, and time.  Psychiatric:        Mood and Affect: Mood normal.        Behavior: Behavior normal.      No results found for any visits on 03/06/23.    The ASCVD Risk score (Arnett DK, et al., 2019) failed to calculate for the following reasons:   The 2019 ASCVD risk score is only valid for ages 66 to 31    Assessment & Plan:   Adenopathy    Return if symptoms worsen or fail to improve.   No discrete masses today.  He did say that it has seemed to resolve  overnight.  He will return if needed.  Libby Maw, MD

## 2023-03-08 DIAGNOSIS — R69 Illness, unspecified: Secondary | ICD-10-CM | POA: Diagnosis not present

## 2023-03-15 DIAGNOSIS — R69 Illness, unspecified: Secondary | ICD-10-CM | POA: Diagnosis not present

## 2023-03-29 ENCOUNTER — Telehealth: Payer: Self-pay | Admitting: Family Medicine

## 2023-03-29 DIAGNOSIS — Z7984 Long term (current) use of oral hypoglycemic drugs: Secondary | ICD-10-CM | POA: Diagnosis not present

## 2023-03-29 DIAGNOSIS — Z79899 Other long term (current) drug therapy: Secondary | ICD-10-CM | POA: Diagnosis not present

## 2023-03-29 DIAGNOSIS — R4781 Slurred speech: Secondary | ICD-10-CM | POA: Diagnosis not present

## 2023-03-29 DIAGNOSIS — R9431 Abnormal electrocardiogram [ECG] [EKG]: Secondary | ICD-10-CM | POA: Diagnosis not present

## 2023-03-29 DIAGNOSIS — N183 Chronic kidney disease, stage 3 unspecified: Secondary | ICD-10-CM | POA: Diagnosis not present

## 2023-03-29 DIAGNOSIS — Z880 Allergy status to penicillin: Secondary | ICD-10-CM | POA: Diagnosis not present

## 2023-03-29 DIAGNOSIS — E1122 Type 2 diabetes mellitus with diabetic chronic kidney disease: Secondary | ICD-10-CM | POA: Diagnosis not present

## 2023-03-29 DIAGNOSIS — G459 Transient cerebral ischemic attack, unspecified: Secondary | ICD-10-CM | POA: Diagnosis not present

## 2023-03-29 DIAGNOSIS — Z0389 Encounter for observation for other suspected diseases and conditions ruled out: Secondary | ICD-10-CM | POA: Diagnosis not present

## 2023-03-29 DIAGNOSIS — I129 Hypertensive chronic kidney disease with stage 1 through stage 4 chronic kidney disease, or unspecified chronic kidney disease: Secondary | ICD-10-CM | POA: Diagnosis not present

## 2023-03-29 DIAGNOSIS — R251 Tremor, unspecified: Secondary | ICD-10-CM | POA: Diagnosis not present

## 2023-03-29 DIAGNOSIS — Z8711 Personal history of peptic ulcer disease: Secondary | ICD-10-CM | POA: Diagnosis not present

## 2023-03-29 DIAGNOSIS — R519 Headache, unspecified: Secondary | ICD-10-CM | POA: Diagnosis not present

## 2023-03-29 DIAGNOSIS — E7800 Pure hypercholesterolemia, unspecified: Secondary | ICD-10-CM | POA: Diagnosis not present

## 2023-03-29 DIAGNOSIS — R200 Anesthesia of skin: Secondary | ICD-10-CM | POA: Diagnosis not present

## 2023-03-29 DIAGNOSIS — R079 Chest pain, unspecified: Secondary | ICD-10-CM | POA: Diagnosis not present

## 2023-03-29 DIAGNOSIS — G20A1 Parkinson's disease without dyskinesia, without mention of fluctuations: Secondary | ICD-10-CM | POA: Diagnosis not present

## 2023-03-29 DIAGNOSIS — Z7982 Long term (current) use of aspirin: Secondary | ICD-10-CM | POA: Diagnosis not present

## 2023-03-29 NOTE — Telephone Encounter (Signed)
Initial Comment Caller states yesterday we was on the patio and having a beer. His right side went numb when he got up and was slurring his words. [place] Not Listed [facility]. Translation No Nurse Assessment Nurse: Raenette Rover, RN, Zella Ball Date/Time (Eastern Time): 03/29/2023 9:49:38 AM Confirm and document reason for call. If symptomatic, describe symptoms. ---Caller states yesterday we was on the patio and having a beer. His right side went numb when he got up and was slurring his words. Thought he was having a stroke yesterday. Waited about 4-5 hours and then called the EMT's they offered to take him to the hospital but he refused to go. Feels better today. Not slurring words today and has a little bit of numbness in the right hand and fingers. When squeezing doesn't feel right. Does the patient have any new or worsening symptoms? ---Yes Will a triage be completed? ---Yes Related visit to physician within the last 2 weeks? ---Yes Does the PT have any chronic conditions? (i.e. diabetes, asthma, this includes High risk factors for pregnancy, etc.) ---Yes List chronic conditions. ---Parkinson's. Is this a behavioral health or substance abuse call? ---No Guidelines Guideline Title Affirmed Question Affirmed Notes Nurse Date/Time Eilene Ghazi Time) Neurologic Deficit [1] Weakness (i.e., paralysis, loss of Raenette Rover, Ronnell [place] 03/29/2023 9:53:14 AM PLEASE NOTE: All timestamps contained within this report are represented as [place] Standard Time. CONFIDENTIALTY NOTICE: This fax transmission is intended only for the addressee. It contains information that is legally privileged, confidential or otherwise protected from use or disclosure. If you are not the intended recipient, you are strictly prohibited from reviewing, disclosing, copying using or disseminating any of this information or taking any action in reliance on or regarding this information. If you have received this fax in  error, please notify us immediately by telephone so that we can arrange for its return to [place]. Phone: [phone], Toll-Free: [phone], Fax: [phone] Page: 2 of 2 Call Id: [id number] Guidelines Guideline Title Affirmed Question Affirmed Notes Nurse Date/Time Eilene Ghazi Time) muscle strength) of the face, arm / hand, or leg / foot on one side of the body AND [2] sudden onset AND [3] brief (now gone) Disp. Time Eilene Ghazi Time) Disposition Final User 03/29/2023 9:47:23 AM Send to Urgent Queue Olga Coaster 03/29/2023 9:56:50 AM Go to ED Now (or PCP triage) Yes Raenette Rover, RN, Zella Ball Final Disposition 03/29/2023 9:56:50 AM Go to ED Now (or PCP triage) Yes Raenette Rover, RN, Herbert Deaner Disagree/Comply Comply Caller Understands Yes PreDisposition Call Doctor Care Advice Given Per Guideline GO TO ED NOW (OR PCP TRIAGE): ANOTHER ADULT SHOULD DRIVE: CARE ADVICE given per Neurologic Deficit (Adult) guideline. Comments User: Wilson Singer, RN Date/Time Eilene Ghazi Time): 03/29/2023 10:02:26 AM No appointments per back line. Referrals GO TO FACILITY OTHER - SPECIFY

## 2023-03-29 NOTE — Telephone Encounter (Signed)
FYI: This call has been transferred to Access Nurse. Once the result note has been entered staff can address the message at that time.  Patient called in with the following symptoms:  Red Word: Whole Right Side Numbness 6-7 hours   Please advise at Mobile [phone] (mobile) Message is routed to Provider Pool and [facility] Triage

## 2023-03-29 NOTE — Telephone Encounter (Signed)
Pt at ED- [facility]

## 2023-03-30 DIAGNOSIS — I6523 Occlusion and stenosis of bilateral carotid arteries: Secondary | ICD-10-CM | POA: Diagnosis not present

## 2023-03-30 DIAGNOSIS — I493 Ventricular premature depolarization: Secondary | ICD-10-CM | POA: Diagnosis not present

## 2023-03-30 DIAGNOSIS — G459 Transient cerebral ischemic attack, unspecified: Secondary | ICD-10-CM | POA: Diagnosis not present

## 2023-04-04 ENCOUNTER — Encounter: Payer: Self-pay | Admitting: Family Medicine

## 2023-04-04 ENCOUNTER — Ambulatory Visit (INDEPENDENT_AMBULATORY_CARE_PROVIDER_SITE_OTHER): Payer: Medicare HMO | Admitting: Family Medicine

## 2023-04-04 VITALS — BP 118/72 | HR 82 | Temp 98.4°F | Ht 73.0 in | Wt 225.4 lb

## 2023-04-04 DIAGNOSIS — Z8673 Personal history of transient ischemic attack (TIA), and cerebral infarction without residual deficits: Secondary | ICD-10-CM | POA: Diagnosis not present

## 2023-04-04 DIAGNOSIS — E1165 Type 2 diabetes mellitus with hyperglycemia: Secondary | ICD-10-CM

## 2023-04-04 MED ORDER — ATORVASTATIN CALCIUM 80 MG PO TABS
80.0000 mg | ORAL_TABLET | Freq: Every day | ORAL | 3 refills | Status: DC
Start: 2023-04-04 — End: 2023-08-22

## 2023-04-04 MED ORDER — FLUTICASONE PROPIONATE 50 MCG/ACT NA SUSP: 2.0000 | Freq: Every day | NASAL | 6 refills | Status: AC

## 2023-04-04 MED ORDER — METFORMIN HCL ER 500 MG PO TB24
1000.0000 mg | ORAL_TABLET | Freq: Every day | ORAL | 2 refills | Status: DC
Start: 2023-04-04 — End: 2024-01-09

## 2023-04-04 NOTE — Patient Instructions (Addendum)
Keep the diet clean and stay active.  Stay hydrated.  For the first week, take 1 tab of metformin daily and then move up to 2 tabs daily.   Continue the Zocor until you get the atorvastatin.   Check your sugars around 2-3 times per week. Alternate checking in the morning before you eat, in the afternoon and before bed. Write them down and bring it to your next appointment.   Start using Flonase for seasonal allergies.  Let us know if you need anything.

## 2023-04-04 NOTE — Progress Notes (Signed)
Chief Complaint  Patient presents with   Follow-up    ED follow-up TIA    Subjective: Patient is a 82 y.o. male here for hosp f/u.  We can, the patient was drinking a beer and went up to go to the restroom.  He felt his entire right side become weak and felt like it was asleep.  He also was slurring his speech.  This lasted for around 6 hours.  The next day he went to the emergency department and was admitted to the hospital on 03/29/2023 and discharged on 03/30/2023.  Sugars were high, A1c was 8.5.  He is on Zocor 40 mg daily.  He reports compliance and no adverse effects.  Diet has not been as healthy as it had been previously.  He is not exercising routinely of the golf.  No chest pain or shortness of breath.  No issues with neurologic signs or symptoms since the episode.  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 No accessory muscle use Neuro: 5/5 strength throughout, DTRs equal and symmetric throughout, no clonus, no cerebellar signs, speech is fluent and goal oriented, CN II through XII grossly intact Eyes: PERRLA, EOMI Psych: Age appropriate judgment and insight, normal affect and mood  Assessment and Plan: History of TIA (transient ischemic attack)  Type 2 diabetes mellitus with hyperglycemia, without long-term current use of insulin - Plan: metFORMIN  (GLUCOPHAGE-XR) 500 MG 24 hr tablet  Change Zocor to Lipitor 80 mg daily.  He will be on Plavix 75 mg daily for 3 weeks in addition to aspirin and then be on mono platelet therapy with only aspirin. Chronic, uncontrolled.  Start back on metformin XR 500 mg daily for 1 week and then increase to 1000 mg daily.  Counseled on diet and exercise.  He will start monitoring his sugars at home.  I will see him in 3 months. The patient voiced understanding and agreement to the plan.  Jilda Roche [place], DO 04/04/23  12:10 PM

## 2023-04-05 ENCOUNTER — Ambulatory Visit: Payer: Medicare HMO | Admitting: Neurology

## 2023-04-05 ENCOUNTER — Encounter: Payer: Self-pay | Admitting: Neurology

## 2023-04-05 VITALS — BP 157/90 | HR 71 | Ht 73.0 in | Wt 225.0 lb

## 2023-04-05 DIAGNOSIS — G20A1 Parkinson's disease without dyskinesia, without mention of fluctuations: Secondary | ICD-10-CM

## 2023-04-05 DIAGNOSIS — G459 Transient cerebral ischemic attack, unspecified: Secondary | ICD-10-CM | POA: Diagnosis not present

## 2023-04-05 NOTE — Progress Notes (Signed)
Chief Complaint  Patient presents with   Hospitalization Follow-up    RM 14, ALONE TIA( PT STATED NOT TERRIBLE)      ASSESSMENT AND PLAN 82 y.o. year old male    Idiopathic Parkinson's disease  Mildly symptomatic, doing very well,  DaTSCAN in January 2019 showed decreased radiotracer activity within bilateral strata with greater deficit on the left,  Continue Azilect 1 mg daily  Decrease Sinemet 25/100 mg 3 times a day, at 8, noon, 5 PM  Continue moderate exercise, he plays golf regularly     TIA on March 28, 2023  Small vessel event involving left thalamus,  now back to late baseline  Vascular risk factor of aging, hyperlipidemia, poorly controlled diabetes, hypertension,  Keep aspirin 81 mg daily  Increase water intake  Return To Clinic With Sarah in August 2024     DIAGNOSTIC DATA (LABS, IMAGING, TESTING) - I reviewed patient records, labs, notes, testing and imaging myself where available.   MEDICAL HISTORY:  Howard Fisher is a 82 year old male, seen in refer by his primary care doctor [name], for evaluation of tremor, involving his right arm and right hand, initial evaluation was on October 09 2017.   I have reviewed and summarized referring note, he has history of hypertension, diabetes, hyperlipidemia,   Since May of 2018, he noticed gradual onset right hand tremor, most noticeable when he is resting his right arm, he denies significant weakness, no left hand involvement,   He had a history of right shoulder injury in 2016 after he fell and landed on his right shoulder.   He denies loss sense of smell, he denies REM sleep disorder, no dizziness, chronic constipation, no family history of Parkinson's disease, he denies gait abnormality.   MRI of the brain November 2017 at the [facility] showed atrophy and small vessel disease no acute abnormality.   Laboratory evaluation in August 2018, normal magnesium, CBC, lipid profile, A1c was 6.1,  normal TSH,   Update January 15, 2018. 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  He started  to take Azilect 1 mg daily since October, getting prescription from [name], he denies significant side effect, no significant improvement   UPDATE July 15 2018:YY He is accompanied by his wife at today's clinical visit, he is taking Sinemet 25/100 mg 3 times a day at 9, 2, 8 PM, no significant side effect noticed, there was no wearing off noticed, he has mild limitation mainly due to pain in his shoulders, no significant gait abnormality,  He has a lot of stress due to recent move, has been moody   UPDATE July 22, 2019, He is with his wife, his Parkinson's disease is overall under good control, getting Azilect 1 mg daily from taking Sinemet 25/100 mg 3 times a day he ambulated without difficulty   UPDATE Apr 27 2021: He is accompanied by his wife at today's clinical visit, he functions very well, denies difficulty walking, only intermittent hand tremor, he is still getting Azilect 1 mg daily, Sinemet 25/100 mg 1 in the morning, 1 and half at noon, 1 tablet at nighttime, he denies significant side effect, no wearing off,  UPDATE April 05 2023: He has been doing very well, tolerating Azilect 1 mg daily, Sinemet 25/100 mg 4 tablets a day, was not sure it has helped any of his symptoms, only intermittent  right hand tremor, denies significant gait abnormality, play his golf regularly  Was treated at [facility] emergency room on April 4, On April 3rd 2024, he had sudden onset right arm leg numness, denies weakness, but has mild slurred speech,next day mildly symptomatic, wife urged him to the emergency room on April 4,  MRI of brain on April 4th 2024, [facility] 1. No acute intracranial abnormality.  2. 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   US carotid showed no significant large vessel disease, less than 39% stenosis at bilateral internal carotid artery  Echocardiogram showed normal ejection fraction, no intraventricular thrombosis  CT angiogram of head and neck, intracranial atherosclerosis, right internal carotid artery 40% stenosis, otherwise no significant large vessel disease  Laboratory evaluations, LDL 95, normal TSH, creatinine 1.34, EGFR 52, A1c 8.5, hemoglobin of 16.3, negative troponin  Seen by PCP [name], now on higher dose of lipitor 80mg  daily,  was put on ASA 81mg  daily,   PHYSICAL EXAM:   Vitals:   04/05/23 1048  BP: (!) 157/90  Pulse: 71  Weight: 225 lb (102.1 kg)  Height: 6\' 1"  (1.854 m)   Not recorded     Body mass index is 29.69 kg/m.  PHYSICAL EXAMNIATION:  Gen: NAD, conversant, well nourised, well groomed                     Cardiovascular: Regular rate rhythm, no peripheral edema, warm, nontender. Eyes: Conjunctivae clear without exudates or hemorrhage Neck: Supple, no carotid bruits. Pulmonary: Clear to auscultation bilaterally   NEUROLOGICAL EXAM:  MENTAL STATUS: Speech/cognition: Awake, alert, oriented to history taking and casual conversation CRANIAL NERVES: CN II: Visual fields are full to confrontation. Pupils are round equal and briskly reactive to light. CN III, IV, VI: extraocular movement are normal. No ptosis. CN V: Facial sensation is intact to light touch CN VII: Face is symmetric with normal eye closure  CN VIII: Hearing is normal to causal conversation. CN IX, X: Phonation is normal. CN XI: Head turning and shoulder shrug are intact  MOTOR: Normal strength, no significant rigidity, bradykinesia, examination was taken 3 hours after his last dose of Sinemet   REFLEXES: Reflexes are 2+ and symmetric at the biceps, triceps, knees, and ankles. Plantar responses are flexor.  SENSORY: Intact to light touch, pinprick and vibratory sensation  are intact in fingers and toes.  COORDINATION: There is no trunk or limb dysmetria noted.  GAIT/STANCE: Push-up from seated position, moderate stride, smooth turning  REVIEW OF SYSTEMS:  Full 14 system review of systems performed and notable only for as above All other review of systems were negative.   ALLERGIES: Allergies  Allergen Reactions   Penicillins     REACTION: rash and itching    HOME MEDICATIONS: Current Outpatient Medications  Medication Sig Dispense Refill   atorvastatin (LIPITOR) 80 MG tablet Take 1 tablet (80 mg total) by mouth daily. 90 tablet 3   carbidopa-levodopa (SINEMET IR) 25-100 MG tablet Take 1 tablet by mouth 4 (four) times daily. 360 tablet 4   Cholecalciferol (D3 PO) Take 1 capsule by mouth daily.     fenofibrate (TRICOR) 48 MG tablet Take 1 tablet (48 mg total) by mouth daily. 90 tablet 0   fluticasone (FLONASE) 50 MCG/ACT nasal spray Place 2 sprays into both nostrils daily. 16 g 6   lisinopril-hydrochlorothiazide (ZESTORETIC) 20-12.5 MG tablet Take 1 tablet by mouth daily. 90 tablet 3   metFORMIN (GLUCOPHAGE-XR) 500  MG 24 hr tablet Take 2 tablets (1,000 mg total) by mouth daily with breakfast. 180 tablet 2   omeprazole (PRILOSEC) 20 MG capsule TAKE 1 CAPSULE DAILY 90 capsule 3   rasagiline (AZILECT) 1 MG TABS tablet Take 1 tablet (1 mg total) by mouth daily. 90 tablet 3   TURMERIC CURCUMIN PO Take 1 tablet by mouth daily.     No current facility-administered medications for this visit.    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 Emmit PomfretEpes   CHOLECYSTECTOMY  02/1993   Dr. Gerrit FriendsGerkin   right hernia repair  2002   Dr. Orson SlickBowman   skin cancer removed from right shoulder  11/2013   melanoma    FAMILY HISTORY: Family History  Problem Relation Age of Onset   Diabetes Mother    Dementia Mother    Dementia Father    Diabetes Sister    Healthy Son        x2   Healthy Daughter        x1   Colon cancer Neg Hx     SOCIAL HISTORY: Social History   Socioeconomic History   Marital status: Married    Spouse name: Alvino ChapelJo   Number of children: 3   Years of education: One year college   Highest education level: Not on file  Occupational History   Occupation: retired Naval architecttruck driver  Tobacco Use   Smoking status: Never   Smokeless tobacco: Never  Vaping Use   Vaping Use: Never used  Substance and Sexual Activity   Alcohol use: Yes    Alcohol/week: 1.0 standard drink of alcohol    Types: 1 Glasses of wine per week    Comment: 3 drinks per week   Drug use: No   Sexual activity: Yes  Other Topics Concern   Not on file  Social History Narrative   Lives at home with his wife.   Right-handed.   2 cups coffee in the morning.   Social Determinants of Health   Financial Resource Strain: Low Risk  (12/01/2021)   Overall Financial Resource Strain (CARDIA)    Difficulty of Paying Living Expenses: Not hard at all  Food Insecurity: No Food Insecurity (12/12/2022)   Hunger Vital Sign    Worried About Running Out of Food in the Last Year: Never true    Ran Out of Food in the Last Year: Never true  Transportation Needs: No Transportation Needs (12/12/2022)   PRAPARE - Administrator, Civil ServiceTransportation    Lack of Transportation (Medical): No    Lack of Transportation (Non-Medical): No  Physical Activity: Insufficiently Active (12/01/2021)   Exercise Vital Sign    Days of Exercise per Week: 3 days    Minutes of Exercise per Session: 30 min  Stress: No Stress  Concern Present (12/01/2021)   [employer] - Occupational Stress Questionnaire    Feeling of Stress :  Not at all  Social Connections: Moderately Isolated (12/01/2021)   Social Connection and Isolation Panel [NHANES]    Frequency of Communication with Friends and Family: More than three times a week    Frequency of Social Gatherings with Friends and Family: More than three times a week    Attends Religious Services: Never    Database administrator or Organizations: No    Attends Banker Meetings: Never    Marital Status: Married  Catering manager Violence: Not At Risk (12/12/2022)   Humiliation, Afraid, Rape, and Kick questionnaire    Fear of Current or Ex-Partner: No    Emotionally Abused: No    Physically Abused: No    Sexually Abused: No      Levert Feinstein, M.D. Ph.D.  [facility] [address] [place], [place] [address] Ph: [phone] Fax: [phone]  CC:  [name], DO [address] [place],  [place] [address]  [name], DO

## 2023-04-10 DIAGNOSIS — R69 Illness, unspecified: Secondary | ICD-10-CM | POA: Diagnosis not present

## 2023-04-17 DIAGNOSIS — R079 Chest pain, unspecified: Secondary | ICD-10-CM | POA: Diagnosis not present

## 2023-04-23 DIAGNOSIS — I493 Ventricular premature depolarization: Secondary | ICD-10-CM | POA: Diagnosis not present

## 2023-04-23 DIAGNOSIS — I491 Atrial premature depolarization: Secondary | ICD-10-CM | POA: Diagnosis not present

## 2023-05-22 ENCOUNTER — Telehealth: Payer: Self-pay

## 2023-05-22 ENCOUNTER — Ambulatory Visit: Payer: Medicare HMO | Admitting: Family Medicine

## 2023-05-22 NOTE — Telephone Encounter (Signed)
Caller Name Merwyn Mollner Caller Phone Number [phone] Patient Name Howard Fisher Patient DOB 10/23/1941 Call Type Message Only Information Provided Reason for Call Request to Reschedule Office Appointment Initial Comment Caller stated that they need to reschedule their appt. Disp. Time Disposition Final User 05/19/2023 3:42:11 PM General Information Provided Yes McGehee, April Call Closed By: April McGehee Transaction Date/Time: 05/19/2023 3:40:08 PM (ET)

## 2023-05-22 NOTE — Telephone Encounter (Signed)
And and canceled Patient stated he is feeling better now. Will just discuss with PCP in July with his regular appointment

## 2023-07-04 ENCOUNTER — Ambulatory Visit (INDEPENDENT_AMBULATORY_CARE_PROVIDER_SITE_OTHER): Payer: Medicare HMO | Admitting: Family Medicine

## 2023-07-04 ENCOUNTER — Encounter: Payer: Self-pay | Admitting: Family Medicine

## 2023-07-04 VITALS — BP 128/80 | HR 75 | Temp 98.2°F | Ht 73.0 in | Wt 213.5 lb

## 2023-07-04 DIAGNOSIS — I10 Essential (primary) hypertension: Secondary | ICD-10-CM | POA: Diagnosis not present

## 2023-07-04 DIAGNOSIS — B353 Tinea pedis: Secondary | ICD-10-CM | POA: Diagnosis not present

## 2023-07-04 DIAGNOSIS — Z7984 Long term (current) use of oral hypoglycemic drugs: Secondary | ICD-10-CM | POA: Diagnosis not present

## 2023-07-04 DIAGNOSIS — E1165 Type 2 diabetes mellitus with hyperglycemia: Secondary | ICD-10-CM | POA: Diagnosis not present

## 2023-07-04 LAB — LIPID PANEL
Cholesterol: 108 mg/dL (ref 0–200)
HDL: 32.8 mg/dL — ABNORMAL LOW (ref >39.00)
LDL Cholesterol: 44 mg/dL (ref 0–99)
NonHDL: 75.54
Total CHOL/HDL Ratio: 3
Triglycerides: 157 mg/dL — ABNORMAL HIGH (ref 0.0–149.0)
VLDL: 31.4 mg/dL (ref 0.0–40.0)

## 2023-07-04 LAB — COMPREHENSIVE METABOLIC PANEL
ALT: 24 U/L (ref 0–53)
AST: 42 U/L — ABNORMAL HIGH (ref 0–37)
Albumin: 4.5 g/dL (ref 3.5–5.2)
Alkaline Phosphatase: 68 U/L (ref 39–117)
BUN: 16 mg/dL (ref 6–23)
CO2: 28 mEq/L (ref 19–32)
Calcium: 10 mg/dL (ref 8.4–10.5)
Chloride: 100 mEq/L (ref 96–112)
Creatinine, Ser: 0.96 mg/dL (ref 0.40–1.50)
GFR: 73.23 mL/min (ref >60.00)
Glucose, Bld: 159 mg/dL — ABNORMAL HIGH (ref 70–99)
Potassium: 4.1 mEq/L (ref 3.5–5.1)
Sodium: 138 mEq/L (ref 135–145)
Total Bilirubin: 1 mg/dL (ref 0.2–1.2)
Total Protein: 7 g/dL (ref 6.0–8.3)

## 2023-07-04 LAB — MICROALBUMIN / CREATININE URINE RATIO
Creatinine,U: 175.7 mg/dL
Microalb Creat Ratio: 0.4 mg/g (ref 0.0–30.0)
Microalb, Ur: <0.7 mg/dL (ref 0.0–1.9)

## 2023-07-04 LAB — HEMOGLOBIN A1C: Hgb A1c MFr Bld: 7.3 % — ABNORMAL HIGH (ref 4.6–6.5)

## 2023-07-04 MED ORDER — KETOCONAZOLE 2 % EX CREA
1.0000 | TOPICAL_CREAM | Freq: Every day | CUTANEOUS | 0 refills | Status: AC
Start: 2023-07-04 — End: 2023-08-15

## 2023-07-04 NOTE — Patient Instructions (Addendum)
Give [place] 2-3 business days to get the results of your labs back.   Keep the diet clean and stay active.  Please get your tetanus booster at the pharmacy.   Please get your shingles vaccines at the pharmacy.   Let us know if you need anything.

## 2023-07-04 NOTE — Progress Notes (Signed)
Subjective:   Chief Complaint  Patient presents with   Follow-up    Wants to review all medications    Howard Fisher is a 82 y.o. male here for follow-up of diabetes.   [name] does not routinely monitor his sugars.  Patient does not require insulin.   Medications include: Metformin XR 1000 mg/d Diet is healthy.  Exercise: golfing No CP or SOB.   Hypertension Patient presents for hypertension follow up. He does not routinely monitor home blood pressures. He is compliant with medications- Zestoretic 20-12.5 mg/d. Patient has these side effects of medication: none Diet/exercise as above.   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   Diabetes mellitus without complication (HCC)    GERD (gastroesophageal reflux disease)    Hiatal hernia    History of stomach ulcers    1985   Hypercholesteremia    Hypertension    Melanoma (HCC) 2014   shoulder   Parkinson disease    Peyronie's disease    Stroke [facility])      Related testing: Retinal exam: Done Pneumovax: done  Objective:  BP 128/80 (BP Location: Left Arm, Patient Position: Sitting, Cuff Size: Normal)   Pulse 75   Temp 98.2 F (36.8 C) (Oral)   Ht 6\' 1"  (1.854 m)   Wt 213 lb 8 oz (96.8 kg)   SpO2 94%   BMI 28.17 kg/m  General:  Well developed, well nourished, in no apparent distress Skin: Macerated tissue between digits 4/5 on the left foot.  Nail plates are yellowed and hyperkeratotic.  Warm, no pallor or diaphoresis Head:  Normocephalic, atraumatic Eyes:  Pupils equal and round, sclera anicteric without injection  Lungs:  CTAB, no access msc use Cardio:  RRR, no bruits, no LE edema Musculoskeletal:  Symmetrical muscle groups noted without atrophy or deformity Neuro:  Sensation intact to pinprick on feet Psych: Age appropriate judgment and insight  Assessment:   Type 2 diabetes mellitus with hyperglycemia, without long-term current use of insulin (HCC)  - Plan: Comprehensive metabolic panel, Lipid panel, Microalbumin / creatinine urine ratio, Hemoglobin A1c  Essential hypertension  Tinea pedis of left foot - Plan: ketoconazole (NIZORAL) 2 % cream   Plan:   Chronic, hopefully stable. Cont Metformin XR 1000 mg/d. Counseled on diet and exercise. Chronic, stable. Cont Zestoretic 20-12.5 mg/d.  6 weeks of ketoconazole cream once daily. Tdap and Shingrix rec'd to get at the pharmacy.  F/u in 3-6 mo pending results. The patient voiced understanding and agreement to the plan.  [name], DO 07/04/23 11:39 AM

## 2023-07-06 ENCOUNTER — Other Ambulatory Visit: Payer: Self-pay | Admitting: Family Medicine

## 2023-07-25 DIAGNOSIS — Z129 Encounter for screening for malignant neoplasm, site unspecified: Secondary | ICD-10-CM | POA: Diagnosis not present

## 2023-07-25 DIAGNOSIS — L570 Actinic keratosis: Secondary | ICD-10-CM | POA: Diagnosis not present

## 2023-07-25 DIAGNOSIS — Z86008 Personal history of in-situ neoplasm of other site: Secondary | ICD-10-CM | POA: Diagnosis not present

## 2023-07-25 DIAGNOSIS — Z85820 Personal history of malignant melanoma of skin: Secondary | ICD-10-CM | POA: Diagnosis not present

## 2023-07-25 DIAGNOSIS — L821 Other seborrheic keratosis: Secondary | ICD-10-CM | POA: Diagnosis not present

## 2023-08-08 DIAGNOSIS — R69 Illness, unspecified: Secondary | ICD-10-CM | POA: Diagnosis not present

## 2023-08-09 ENCOUNTER — Ambulatory Visit: Payer: Medicare HMO | Admitting: Neurology

## 2023-08-21 NOTE — Progress Notes (Unsigned)
No chief complaint on file.    ASSESSMENT AND PLAN 82 y.o. year old male    Idiopathic Parkinson's disease  Mildly symptomatic, doing very well,  DaTSCAN in January 2019 showed decreased radiotracer activity within bilateral strata with greater deficit on the left,  Continue Azilect 1 mg daily  Decrease Sinemet 25/100 mg 3 times a day, at 8, noon, 5 PM  Continue moderate exercise, he plays golf regularly     TIA on March 28, 2023  Small vessel event involving left thalamus,  now back to late baseline  Vascular risk factor of aging, hyperlipidemia, poorly controlled diabetes, hypertension,  Keep aspirin 81 mg daily  Increase water intake  Return To Clinic With Dontario Evetts in August 2024     DIAGNOSTIC DATA (LABS, IMAGING, TESTING) - I reviewed patient records, labs, notes, testing and imaging myself where available.   MEDICAL HISTORY:  GEROGE CRISMAN is a 82 year old male, seen in refer by his primary care doctor [name], for evaluation of tremor, involving his right arm and right hand, initial evaluation was on October 09 2017.   I have reviewed and summarized referring note, he has history of hypertension, diabetes, hyperlipidemia,   Since May of 2018, he noticed gradual onset right hand tremor, most noticeable when he is resting his right arm, he denies significant weakness, no left hand involvement,   He had a history of right shoulder injury in 2016 after he fell and landed on his right shoulder.   He denies loss sense of smell, he denies REM sleep disorder, no dizziness, chronic constipation, no family history of Parkinson's disease, he denies gait abnormality.   MRI of the brain November 2017 at the [facility] showed atrophy and small vessel disease no acute abnormality.   Laboratory evaluation in August 2018, normal magnesium, CBC, lipid profile, A1c was 6.1, normal TSH,   Update January 15, 2018. DEXA scan in January 2019 showed decreased  radiotracer activity within bilateral strata with greater deficit on the left side, findings are most consistent with Parkinson syndromes pattern  He started  to take Azilect 1 mg daily since October, getting prescription from [name], he denies significant side effect, no significant improvement   UPDATE July 15 2018:YY He is accompanied by his wife at today's clinical visit, he is taking Sinemet 25/100 mg 3 times a day at 9, 2, 8 PM, no significant side effect noticed, there was no wearing off noticed, he has mild limitation mainly due to pain in his shoulders, no significant gait abnormality,  He has a lot of stress due to recent move, has been moody   UPDATE July 22, 2019, He is with his wife, his Parkinson's disease is overall under good control, getting Azilect 1 mg daily from taking Sinemet 25/100 mg 3 times a day he ambulated without difficulty   UPDATE Apr 27 2021: He is accompanied by his wife at today's clinical visit, he functions very well, denies difficulty walking, only intermittent hand tremor, he is still getting Azilect 1 mg daily, Sinemet 25/100 mg 1 in the morning, 1 and half at noon, 1 tablet at nighttime, he denies significant side effect, no wearing off,  UPDATE April 05 2023: He has been doing very well, tolerating Azilect 1 mg daily, Sinemet 25/100 mg 4 tablets a day, was not sure it has helped any of his symptoms, only intermittent right hand tremor, denies significant gait abnormality, play his golf regularly  Was treated at [facility] emergency  room on April 4, On April 3rd 2024, he had sudden onset right arm leg numness, denies weakness, but has mild slurred speech,next day mildly symptomatic, wife urged him to the emergency room on April 4,  MRI of brain on April 4th 2024, [facility] 1. No acute intracranial abnormality.  2. 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   US carotid showed no significant large vessel disease, less than  39% stenosis at bilateral internal carotid artery  Echocardiogram showed normal ejection fraction, no intraventricular thrombosis  CT angiogram of head and neck, intracranial atherosclerosis, right internal carotid artery 40% stenosis, otherwise no significant large vessel disease  Laboratory evaluations, LDL 95, normal TSH, creatinine 1.34, EGFR 52, A1c 8.5, hemoglobin of 16.3, negative troponin  Seen by PCP [name], now on higher dose of lipitor 80mg  daily,  was put on ASA 81mg  daily,   Update August 22, 2023 SS:   PHYSICAL EXAM:   There were no vitals filed for this visit.  Not recorded     There is no height or weight on file to calculate BMI.  PHYSICAL EXAMNIATION:  Gen: NAD, conversant, well nourised, well groomed                     Cardiovascular: Regular rate rhythm, no peripheral edema, warm, nontender. Eyes: Conjunctivae clear without exudates or hemorrhage Neck: Supple, no carotid bruits. Pulmonary: Clear to auscultation bilaterally   NEUROLOGICAL EXAM:  MENTAL STATUS: Speech/cognition: Awake, alert, oriented to history taking and casual conversation CRANIAL NERVES: CN II: Visual fields are full to confrontation. Pupils are round equal and briskly reactive to light. CN III, IV, VI: extraocular movement are normal. No ptosis. CN V: Facial sensation is intact to light touch CN VII: Face is symmetric with normal eye closure  CN VIII: Hearing is normal to causal conversation. CN IX, X: Phonation is normal. CN XI: Head turning and shoulder shrug are intact  MOTOR: Normal strength, no significant rigidity, bradykinesia, examination was taken 3 hours after his last dose of Sinemet   REFLEXES: Reflexes are 2+ and symmetric at the biceps, triceps, knees, and ankles. Plantar responses are flexor.  SENSORY: Intact to light touch, pinprick and vibratory sensation are intact in fingers and toes.  COORDINATION: There is no trunk or limb dysmetria  noted.  GAIT/STANCE: Push-up from seated position, moderate stride, smooth turning  REVIEW OF SYSTEMS:  Full 14 system review of systems performed and notable only for as above All other review of systems were negative.   ALLERGIES: Allergies  Allergen Reactions   Penicillins     REACTION: rash and itching    HOME MEDICATIONS: Current Outpatient Medications  Medication Sig Dispense Refill   aspirin EC 81 MG tablet Take 81 mg by mouth daily. Swallow whole.     atorvastatin (LIPITOR) 80 MG tablet Take 1 tablet (80 mg total) by mouth daily. 90 tablet 3   carbidopa-levodopa (SINEMET IR) 25-100 MG tablet Take 1 tablet by mouth 4 (four) times daily. (Patient taking differently: Take 1 tablet by mouth 3 (three) times daily. At 8, 12, 5pm) 360 tablet 4   Cholecalciferol (D3 PO) Take 1 capsule by mouth daily.     fenofibrate (TRICOR) 48 MG tablet Take 1 tablet (48 mg total) by mouth daily. 90 tablet 0   fluticasone (FLONASE) 50 MCG/ACT nasal spray Place 2 sprays into both nostrils daily. 16 g 6   lisinopril-hydrochlorothiazide (ZESTORETIC) 20-12.5 MG tablet TAKE 1 TABLET DAILY  90 tablet 3   metFORMIN (GLUCOPHAGE-XR) 500 MG 24 hr tablet Take 2 tablets (1,000 mg total) by mouth daily with breakfast. 180 tablet 2   omeprazole (PRILOSEC) 20 MG capsule TAKE 1 CAPSULE DAILY 90 capsule 3   rasagiline (AZILECT) 1 MG TABS tablet Take 1 tablet (1 mg total) by mouth daily. 90 tablet 3   TURMERIC CURCUMIN PO Take 1 tablet by mouth daily.     No current facility-administered medications for this visit.    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 Emmit Pomfret   CHOLECYSTECTOMY  02/1993   Dr. Gerrit Friends   right hernia repair  2002   Dr. Orson Slick   skin cancer removed from right shoulder  11/2013   melanoma    FAMILY HISTORY: Family History  Problem Relation Age of Onset   Diabetes Mother    Dementia Mother    Dementia Father    Diabetes Sister    Healthy Son        x2   Healthy Daughter        x1   Colon cancer Neg Hx     SOCIAL HISTORY: Social History   Socioeconomic History   Marital status: Married    Spouse name: Alvino Chapel   Number of children: 3   Years of education: One year college   Highest education level: Not on file  Occupational History   Occupation: retired Naval architect  Tobacco Use   Smoking status: Never   Smokeless tobacco: Never  Vaping Use   Vaping status: Never Used  Substance and Sexual Activity   Alcohol use: Yes    Alcohol/week: 1.0 standard drink of alcohol    Types: 1 Glasses of wine per week    Comment: 3 drinks per week   Drug use: No   Sexual activity: Yes  Other Topics Concern   Not on file  Social History Narrative   Lives at home with his wife.   Right-handed.   2 cups coffee in the morning.   Social Determinants of Health   Financial Resource Strain: Low Risk  (12/01/2021)   Overall Financial Resource Strain (CARDIA)    Difficulty of Paying Living Expenses: Not hard at all  Food Insecurity: No Food Insecurity (12/12/2022)   Hunger Vital Sign    Worried About Running Out of Food in the Last Year: Never true    Ran Out of Food in the Last Year: Never true  Transportation Needs: No Transportation Needs (12/12/2022)   PRAPARE - Administrator, Civil Service (Medical): No    Lack of Transportation (Non-Medical): No  Physical Activity: Insufficiently Active (12/01/2021)   Exercise Vital Sign    Days of  Exercise per Week: 3 days    Minutes of Exercise per Session: 30 min  Stress: No Stress Concern Present (12/01/2021)   [employer] of Occupational  Health - Occupational Stress Questionnaire    Feeling of Stress : Not at all  Social Connections: Moderately Isolated (12/01/2021)   Social Connection and Isolation Panel [NHANES]    Frequency of Communication with Friends and Family: More than three times a week    Frequency of Social Gatherings with Friends and Family: More than three times a week    Attends Religious Services: Never    Database administrator or Organizations: No    Attends Banker Meetings: Never    Marital Status: Married  Catering manager Violence: Not At Risk (12/12/2022)   Humiliation, Afraid, Rape, and Kick questionnaire    Fear of Current or Ex-Partner: No    Emotionally Abused: No    Physically Abused: No    Sexually Abused: No   Margie Ege, Edrick Oh, DNP  [facility] [address] [place], [place] [address] [phone]

## 2023-08-22 ENCOUNTER — Telehealth: Payer: Self-pay | Admitting: Neurology

## 2023-08-22 ENCOUNTER — Ambulatory Visit: Payer: Medicare HMO | Admitting: Neurology

## 2023-08-22 ENCOUNTER — Encounter: Payer: Self-pay | Admitting: Neurology

## 2023-08-22 VITALS — BP 148/95 | HR 82 | Ht 73.0 in | Wt 211.0 lb

## 2023-08-22 DIAGNOSIS — R29898 Other symptoms and signs involving the musculoskeletal system: Secondary | ICD-10-CM

## 2023-08-22 DIAGNOSIS — I10 Essential (primary) hypertension: Secondary | ICD-10-CM

## 2023-08-22 DIAGNOSIS — Z7984 Long term (current) use of oral hypoglycemic drugs: Secondary | ICD-10-CM | POA: Diagnosis not present

## 2023-08-22 DIAGNOSIS — G459 Transient cerebral ischemic attack, unspecified: Secondary | ICD-10-CM | POA: Diagnosis not present

## 2023-08-22 DIAGNOSIS — E1165 Type 2 diabetes mellitus with hyperglycemia: Secondary | ICD-10-CM

## 2023-08-22 DIAGNOSIS — G20A1 Parkinson's disease without dyskinesia, without mention of fluctuations: Secondary | ICD-10-CM

## 2023-08-22 MED ORDER — CARBIDOPA-LEVODOPA 25-100 MG PO TABS: 1.0000 | ORAL_TABLET | Freq: Four times a day (QID) | ORAL | 4 refills | Status: DC

## 2023-08-22 MED ORDER — RASAGILINE MESYLATE 1 MG PO TABS: 1.0000 mg | ORAL_TABLET | Freq: Every day | ORAL | 3 refills | Status: DC

## 2023-08-22 NOTE — Patient Instructions (Addendum)
I will order urgent MRI brain due to left sided weakness  Increase Sinemet to 25/100 mg 1 tablet 4 times daily Any stroke like symptoms go to the ER Recommend aspirin 81 mg daily for secondary stroke prevention

## 2023-08-22 NOTE — Telephone Encounter (Signed)
I ordered urgent MRI brain today. Can you assist with getting patient scheduled. He would prefer somewhere in [facility]. Thanks

## 2023-08-22 NOTE — Telephone Encounter (Signed)
[employer] medicare Berkley Harvey: [id number] exp. 08/22/23-02/18/24 sent to [facility] [phone]

## 2023-08-23 ENCOUNTER — Telehealth: Payer: Self-pay | Admitting: Neurology

## 2023-08-23 ENCOUNTER — Ambulatory Visit (HOSPITAL_COMMUNITY)
Admission: RE | Admit: 2023-08-23 | Discharge: 2023-08-23 | Disposition: A | Discharge status: Home / Self Care | Payer: Medicare HMO | Source: Ambulatory Visit | Attending: Neurology | Admitting: Neurology

## 2023-08-23 DIAGNOSIS — R29898 Other symptoms and signs involving the musculoskeletal system: Secondary | ICD-10-CM | POA: Insufficient documentation

## 2023-08-23 DIAGNOSIS — I639 Cerebral infarction, unspecified: Secondary | ICD-10-CM

## 2023-08-23 MED ORDER — CLOPIDOGREL BISULFATE 75 MG PO TABS: 75.0000 mg | ORAL_TABLET | Freq: Every day | ORAL | 11 refills | Status: DC

## 2023-08-23 NOTE — Telephone Encounter (Signed)
I called the patient.  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   He will need to do 3 weeks aspirin 81 mg daily and Plavix 75 mg daily, then Plavix 75 mg daily alone.  Needs aggressive risk factor management, Strict management of vascular risk factors with a goal BP less than 130/90, A1c less than 7.0, LDL less than 70 for secondary stroke prevention.  I will order physical therapy for the left-sided weakness. Reviewed prior work up with [name] for TIA in April, no further work up needed at this time. Any stroke like symptoms he should go to the ER.   I should see him back in 3 months.   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  Orders Placed This Encounter  Procedures   Ambulatory referral to Physical Therapy   Meds ordered this encounter  Medications   clopidogrel (PLAVIX) 75 MG tablet    Sig: Take 1 tablet (75 mg total) by mouth daily.    Dispense:  30 tablet    Refill:  11

## 2023-08-23 NOTE — Telephone Encounter (Signed)
Pt has been scheduled for a 3 month f/u per request of NP.

## 2023-08-23 NOTE — Progress Notes (Signed)
Chart reviewed, agree above plan ?

## 2023-08-29 ENCOUNTER — Telehealth: Payer: Self-pay | Admitting: Neurology

## 2023-08-29 DIAGNOSIS — Z7984 Long term (current) use of oral hypoglycemic drugs: Secondary | ICD-10-CM | POA: Diagnosis not present

## 2023-08-29 DIAGNOSIS — J309 Allergic rhinitis, unspecified: Secondary | ICD-10-CM | POA: Diagnosis not present

## 2023-08-29 DIAGNOSIS — M1990 Unspecified osteoarthritis, unspecified site: Secondary | ICD-10-CM | POA: Diagnosis not present

## 2023-08-29 DIAGNOSIS — G20A1 Parkinson's disease without dyskinesia, without mention of fluctuations: Secondary | ICD-10-CM | POA: Diagnosis not present

## 2023-08-29 DIAGNOSIS — E1142 Type 2 diabetes mellitus with diabetic polyneuropathy: Secondary | ICD-10-CM | POA: Diagnosis not present

## 2023-08-29 DIAGNOSIS — K219 Gastro-esophageal reflux disease without esophagitis: Secondary | ICD-10-CM | POA: Diagnosis not present

## 2023-08-29 DIAGNOSIS — E785 Hyperlipidemia, unspecified: Secondary | ICD-10-CM | POA: Diagnosis not present

## 2023-08-29 DIAGNOSIS — N529 Male erectile dysfunction, unspecified: Secondary | ICD-10-CM | POA: Diagnosis not present

## 2023-08-29 DIAGNOSIS — N3941 Urge incontinence: Secondary | ICD-10-CM | POA: Diagnosis not present

## 2023-08-29 DIAGNOSIS — Z008 Encounter for other general examination: Secondary | ICD-10-CM | POA: Diagnosis not present

## 2023-08-29 DIAGNOSIS — I10 Essential (primary) hypertension: Secondary | ICD-10-CM | POA: Diagnosis not present

## 2023-08-29 NOTE — Telephone Encounter (Signed)
Telephone note that patient refused PT. Referral was closed.

## 2023-10-07 ENCOUNTER — Other Ambulatory Visit: Payer: Self-pay | Admitting: Family Medicine

## 2023-11-01 DIAGNOSIS — R69 Illness, unspecified: Secondary | ICD-10-CM | POA: Diagnosis not present

## 2023-11-12 DIAGNOSIS — H43813 Vitreous degeneration, bilateral: Secondary | ICD-10-CM | POA: Diagnosis not present

## 2023-11-12 DIAGNOSIS — H524 Presbyopia: Secondary | ICD-10-CM | POA: Diagnosis not present

## 2023-11-12 DIAGNOSIS — D3132 Benign neoplasm of left choroid: Secondary | ICD-10-CM | POA: Diagnosis not present

## 2023-11-12 DIAGNOSIS — H16223 Keratoconjunctivitis sicca, not specified as Sjogren's, bilateral: Secondary | ICD-10-CM | POA: Diagnosis not present

## 2023-11-12 DIAGNOSIS — H52203 Unspecified astigmatism, bilateral: Secondary | ICD-10-CM | POA: Diagnosis not present

## 2023-11-12 DIAGNOSIS — H02135 Senile ectropion of left lower eyelid: Secondary | ICD-10-CM | POA: Diagnosis not present

## 2023-11-12 DIAGNOSIS — H02132 Senile ectropion of right lower eyelid: Secondary | ICD-10-CM | POA: Diagnosis not present

## 2023-11-12 DIAGNOSIS — E119 Type 2 diabetes mellitus without complications: Secondary | ICD-10-CM | POA: Diagnosis not present

## 2023-11-12 LAB — HM DIABETES EYE EXAM

## 2023-11-27 DIAGNOSIS — D485 Neoplasm of uncertain behavior of skin: Secondary | ICD-10-CM | POA: Diagnosis not present

## 2023-11-27 DIAGNOSIS — C44629 Squamous cell carcinoma of skin of left upper limb, including shoulder: Secondary | ICD-10-CM | POA: Diagnosis not present

## 2023-11-27 NOTE — Progress Notes (Unsigned)
No chief complaint on file.  ASSESSMENT AND PLAN 82 y.o. year old male    1.  Idiopathic Parkinson's disease 2.  TIA March 28, 2023 (right-sided arm and leg numbness; small vessel event involving left thalamus) 3.  Left-sided weakness to the leg, gait instability x 1 week  4.  Hypertension 5.  Diabetes 6.  Hyperlipidemia  -Patient well-known to me, onset left-sided weakness, gait instability about 1 week ago, the symptoms are exhibited on exam.  Was not able to finish golf yesterday due to the above, very different from baseline for him.  Will order stat MRI brain rule out acute stroke event with history TIA April 2024 -Increase Sinemet 25/100 mg back to 1 tablet 4 times daily, continue Azilect 1 mg daily -Continue aspirin 81 mg daily (had been taking every other day) -Strict management of vascular risk factors with a goal BP less than 130/90, A1c less than 7.0, LDL less than 70 for secondary stroke prevention -Follow-up with me in 6 months or sooner if needed  DIAGNOSTIC DATA (LABS, IMAGING, TESTING) - I reviewed patient records, labs, notes, testing and imaging myself where available.   MEDICAL HISTORY:  Howard Fisher is a 82 year old male, seen in refer by his primary care doctor Howard Fisher, for evaluation of tremor, involving his right arm and right hand, initial evaluation was on October 09 2017.   I have reviewed and summarized referring note, he has history of hypertension, diabetes, hyperlipidemia,   Since May of 2018, he noticed gradual onset right hand tremor, most noticeable when he is resting his right arm, he denies significant weakness, no left hand involvement,   He had a history of right shoulder injury in 2016 after he fell and landed on his right shoulder.   He denies loss sense of smell, he denies REM sleep disorder, no dizziness, chronic constipation, no family history of Parkinson's disease, he denies gait abnormality.   MRI of the brain November  2017 at the [facility] showed atrophy and small vessel disease no acute abnormality.   Laboratory evaluation in August 2018, normal magnesium, CBC, lipid profile, A1c was 6.1, normal TSH,   Update January 15, 2018. 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  He started  to take Azilect 1 mg daily since October, getting prescription from [name], he denies significant side effect, no significant improvement   UPDATE July 15 2018:YY He is accompanied by his wife at today's clinical visit, he is taking Sinemet 25/100 mg 3 times a day at 9, 2, 8 PM, no significant side effect noticed, there was no wearing off noticed, he has mild limitation mainly due to pain in his shoulders, no significant gait abnormality,  He has a lot of stress due to recent move, has been moody   UPDATE July 22, 2019, He is with his wife, his Parkinson's disease is overall under good control, getting Azilect 1 mg daily from taking Sinemet 25/100 mg 3 times a day he ambulated without difficulty   UPDATE Apr 27 2021: He is accompanied by his wife at today's clinical visit, he functions very well, denies difficulty walking, only intermittent hand tremor, he is still getting Azilect 1 mg daily, Sinemet 25/100 mg 1 in the morning, 1 and half at noon, 1 tablet at nighttime, he denies significant side effect, no wearing off,  UPDATE April 05 2023:  He has been doing very well, tolerating Azilect 1 mg daily, Sinemet 25/100 mg 4 tablets a day, was not sure it has helped any of his symptoms, only intermittent right hand tremor, denies significant gait abnormality, play his golf regularly  Was treated at [name] emergency room on April 4, On April 3rd 2024, he had sudden onset right arm leg numness, denies weakness, but has mild slurred speech,next day mildly symptomatic, wife urged him  to the emergency room on April 4,  MRI of brain on April 4th 2024, [facility] 1. No acute intracranial abnormality.  2. 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   US carotid showed no significant large vessel disease, less than 39% stenosis at bilateral internal carotid artery  Echocardiogram showed normal ejection fraction, no intraventricular thrombosis  CT angiogram of head and neck, intracranial atherosclerosis, right internal carotid artery 40% stenosis, otherwise no significant large vessel disease  Laboratory evaluations, LDL 95, normal TSH, creatinine 1.34, EGFR 52, A1c 8.5, hemoglobin of 16.3, negative troponin  Seen by PCP [name], now on higher dose of lipitor 80mg  daily,  was put on ASA 81mg  daily,   Update August 22, 2023 SS: In April 2024 [name] reduced Sinemet from 4 times daily to 3 times daily, on Azilect 1 mg daily. He has noted more tremor in the right hand, feeling more unsteady. Still playing golf, yesterday, he felt weak, unsteady, he had to stop playing golf. He still feels this way, 1 week ago left leg started feeling weak, new to him, still feels weak. Feels off balance, feels left leg drags Taking aspirin 81 every other day. On Lipitor. No headaches or vision change.  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  Recommended 3 weeks DAPT aspirin 81 and Plavix 75, then Plavix 75 daily alone.  He refused physical therapy.  PHYSICAL EXAM:   There were no vitals filed for this visit.  Not recorded    There is no height or weight on file to calculate BMI.  Physical Exam  General: The patient is alert and cooperative at the time of the examination.  Skin: No significant peripheral edema is noted.  Neurologic Exam  Mental status: The patient is alert and oriented x 3 at the time of the examination. The patient has apparent normal recent and remote memory,  with an apparently normal attention span and concentration ability.  Cranial nerves: Facial symmetry is present. Speech is normal, no aphasia or dysarthria is noted. Extraocular movements are full. Visual fields are full.  Motor: 4/5 weakness left hip flexion, some hesitancy.  Resting tremor right hand noted moderately.  Sensory examination: Soft touch sensation is symmetric on the face, arms, and legs.  Coordination: The patient has good finger-nose-finger and heel-to-shin bilaterally.  Gait and station: Can stand from seated position without pushoff, slight limp on the left side, with turns unsteady on the left   Reflexes: Deep tendon reflexes are symmetric.  REVIEW OF SYSTEMS:  Full 14 system review of systems performed and notable only for as above All other review of systems were negative.   ALLERGIES: Allergies  Allergen Reactions   Penicillins     REACTION: rash and itching    HOME MEDICATIONS: Current Outpatient Medications  Medication Sig Dispense Refill   aspirin EC 81 MG tablet Take 81 mg by mouth daily. Swallow whole.     carbidopa-levodopa (SINEMET IR) 25-100 MG tablet  Take 1 tablet by mouth 4 (four) times daily. 360 tablet 4   Cholecalciferol (D3 PO) Take 1 capsule by mouth daily.     clopidogrel (PLAVIX) 75 MG tablet Take 1 tablet (75 mg total) by mouth daily. 30 tablet 11   fenofibrate (TRICOR) 48 MG tablet Take 1 tablet (48 mg total) by mouth daily. 90 tablet 0   fluticasone (FLONASE) 50 MCG/ACT nasal spray Place 2 sprays into both nostrils daily. 16 g 6   lisinopril-hydrochlorothiazide (ZESTORETIC) 20-12.5 MG tablet TAKE 1 TABLET DAILY 90 tablet 3   metFORMIN (GLUCOPHAGE-XR) 500 MG 24 hr tablet Take 2 tablets (1,000 mg total) by mouth daily with breakfast. 180 tablet 2   omeprazole (PRILOSEC) 20 MG capsule TAKE 1 CAPSULE DAILY 90 capsule 3   rasagiline (AZILECT) 1 MG TABS tablet Take 1 tablet (1 mg total) by mouth daily. 90 tablet 3   simvastatin (ZOCOR) 40 MG  tablet Take 40 mg by mouth at bedtime.     TURMERIC CURCUMIN PO Take 1 tablet by mouth daily.     No current facility-administered medications for this visit.    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 Emmit Pomfret   CHOLECYSTECTOMY  02/1993   Dr. Gerrit Friends   right hernia repair  2002   Dr. Orson Slick   skin cancer removed from right shoulder  11/2013   melanoma    FAMILY HISTORY: Family History  Problem Relation Age of Onset   Diabetes Mother    Dementia Mother    Dementia Father    Diabetes Sister    Healthy Son        x2   Healthy Daughter        x1   Colon cancer Neg Hx     SOCIAL HISTORY: Social History   Socioeconomic History   Marital status: Married    Spouse name: Alvino Chapel   Number of children: 3   Years of education: One year college   Highest education level: Not on file  Occupational History   Occupation: retired Naval architect  Tobacco Use   Smoking status: Never   Smokeless tobacco: Never  Vaping Use   Vaping status: Never Used  Substance and Sexual Activity   Alcohol use: Yes    Alcohol/week: 1.0 standard drink of alcohol    Types: 1 Glasses of wine per week    Comment: 3 drinks per week   Drug use: No   Sexual activity: Yes  Other Topics Concern   Not on file  Social History Narrative   Lives at home with his wife.   Right-handed.   2 cups coffee in the morning.   Social Determinants of Health   Financial Resource Strain: Low Risk   (12/01/2021)   Overall Financial Resource Strain (CARDIA)    Difficulty of Paying Living Expenses: Not hard at all  Food Insecurity: No  Food Insecurity (12/12/2022)   Hunger Vital Sign    Worried About Running Out of Food in the Last Year: Never true    Ran Out of Food in the Last Year: Never true  Transportation Needs: No Transportation Needs (12/12/2022)   PRAPARE - Administrator, Civil Service (Medical): No    Lack of Transportation (Non-Medical): No  Physical Activity: Insufficiently Active (12/01/2021)   Exercise Vital Sign    Days of Exercise per Week: 3 days    Minutes of Exercise per Session: 30 min  Stress: No Stress Concern Present (12/01/2021)   [employer] of Occupational Health - Occupational Stress Questionnaire    Feeling of Stress : Not at all  Social Connections: Moderately Isolated (12/01/2021)   Social Connection and Isolation Panel [NHANES]    Frequency of Communication with Friends and Family: More than three times a week    Frequency of Social Gatherings with Friends and Family: More than three times a week    Attends Religious Services: Never    Database administrator or Organizations: No    Attends Banker Meetings: Never    Marital Status: Married  Catering manager Violence: Not At Risk (12/12/2022)   Humiliation, Afraid, Rape, and Kick questionnaire    Fear of Current or Ex-Partner: No    Emotionally Abused: No    Physically Abused: No    Sexually Abused: No   Margie Ege, Edrick Oh, DNP  [facility] [address] [place], [place] [address] [phone]

## 2023-11-28 ENCOUNTER — Ambulatory Visit: Payer: Medicare HMO | Admitting: Neurology

## 2023-11-28 ENCOUNTER — Encounter: Payer: Self-pay | Admitting: Neurology

## 2023-11-28 VITALS — BP 125/96 | HR 80 | Ht 73.0 in | Wt 212.4 lb

## 2023-11-28 DIAGNOSIS — I639 Cerebral infarction, unspecified: Secondary | ICD-10-CM

## 2023-11-28 DIAGNOSIS — G20A1 Parkinson's disease without dyskinesia, without mention of fluctuations: Secondary | ICD-10-CM

## 2023-11-28 MED ORDER — RASAGILINE MESYLATE 1 MG PO TABS: 1.0000 mg | ORAL_TABLET | Freq: Every day | ORAL | 3 refills | Status: AC

## 2023-11-28 MED ORDER — CARBIDOPA-LEVODOPA 25-100 MG PO TABS: 1.0000 | ORAL_TABLET | Freq: Four times a day (QID) | ORAL | 4 refills | Status: AC

## 2023-11-28 MED ORDER — CLOPIDOGREL BISULFATE 75 MG PO TABS: 75.0000 mg | ORAL_TABLET | Freq: Every day | ORAL | 3 refills | Status: AC

## 2023-11-28 NOTE — Patient Instructions (Addendum)
Continue Plavix 75 mg daily for secondary stroke prevention, you can stop the aspirin  Strict management of vascular risk factors with a goal BP less than 130/90, A1c less than 7.0, LDL less than 70 for secondary stroke prevention. Ask primary care doctor about checking A1C, lipid panel Continue current Parkinson's medications  Follow up in 6 months

## 2023-11-29 NOTE — Progress Notes (Signed)
Chart reviewed, agree above plan ?

## 2023-12-31 ENCOUNTER — Ambulatory Visit (INDEPENDENT_AMBULATORY_CARE_PROVIDER_SITE_OTHER): Payer: Medicare HMO | Admitting: *Deleted

## 2023-12-31 DIAGNOSIS — Z0000 Encounter for general adult medical examination without abnormal findings: Secondary | ICD-10-CM

## 2023-12-31 NOTE — Patient Instructions (Signed)
 Mr. Howard Fisher , Thank you for taking time to come for your Medicare Wellness Visit. I appreciate your ongoing commitment to your health goals. Please review the following plan we discussed and let me know if I can assist you in the future.     This is a list of the screening recommended for you and due dates:  Health Maintenance  Topic Date Due   Zoster (Shingles) Vaccine (1 of 2) Never done   DTaP/Tdap/Td vaccine (2 - Td or Tdap) 01/02/2022   Hemoglobin A1C  01/04/2024   Eye exam for diabetics  02/04/2024   Yearly kidney function blood test for diabetes  07/03/2024   Yearly kidney health urinalysis for diabetes  07/03/2024   Complete foot exam   07/03/2024   Medicare Annual Wellness Visit  12/30/2024   Pneumonia Vaccine  Completed   HPV Vaccine  Aged Out   Flu Shot  Discontinued   Colon Cancer Screening  Discontinued   COVID-19 Vaccine  Discontinued    Next appointment: Follow up in one year for your annual wellness visit.   Preventive Care 84 Years and Older, Male Preventive care refers to lifestyle choices and visits with your health care provider that can promote health and wellness. What does preventive care include? A yearly physical exam. This is also called an annual well check. Dental exams once or twice a year. Routine eye exams. Ask your health care provider how often you should have your eyes checked. Personal lifestyle choices, including: Daily care of your teeth and gums. Regular physical activity. Eating a healthy diet. Avoiding tobacco and drug use. Limiting alcohol use. Practicing safe sex. Taking low doses of aspirin every day. Taking vitamin and mineral supplements as recommended by your health care provider. What happens during an annual well check? The services and screenings done by your health care provider during your annual well check will depend on your age, overall health, lifestyle risk factors, and family history of disease. Counseling  Your health  care provider may ask you questions about your: Alcohol use. Tobacco use. Drug use. Emotional well-being. Home and relationship well-being. Sexual activity. Eating habits. History of falls. Memory and ability to understand (cognition). Work and work astronomer. Screening  You may have the following tests or measurements: Height, weight, and BMI. Blood pressure. Lipid and cholesterol levels. These may be checked every 5 years, or more frequently if you are over 65 years old. Skin check. Lung cancer screening. You may have this screening every year starting at age 34 if you have a 30-pack-year history of smoking and currently smoke or have quit within the past 15 years. Fecal occult blood test (FOBT) of the stool. You may have this test every year starting at age 72. Flexible sigmoidoscopy or colonoscopy. You may have a sigmoidoscopy every 5 years or a colonoscopy every 10 years starting at age 54. Prostate cancer screening. Recommendations will vary depending on your family history and other risks. Hepatitis C blood test. Hepatitis B blood test. Sexually transmitted disease (STD) testing. Diabetes screening. This is done by checking your blood sugar (glucose) after you have not eaten for a while (fasting). You may have this done every 1-3 years. Abdominal aortic aneurysm (AAA) screening. You may need this if you are a current or former smoker. Osteoporosis. You may be screened starting at age 37 if you are at high risk. Talk with your health care provider about your test results, treatment options, and if necessary, the need for more tests.  Vaccines  Your health care provider may recommend certain vaccines, such as: Influenza vaccine. This is recommended every year. Tetanus, diphtheria, and acellular pertussis (Tdap, Td) vaccine. You may need a Td booster every 10 years. Zoster vaccine. You may need this after age 39. Pneumococcal 13-valent conjugate (PCV13) vaccine. One dose is  recommended after age 51. Pneumococcal polysaccharide (PPSV23) vaccine. One dose is recommended after age 37. Talk to your health care provider about which screenings and vaccines you need and how often you need them. This information is not intended to replace advice given to you by your health care provider. Make sure you discuss any questions you have with your health care provider. Document Released: 01/07/2016 Document Revised: 08/30/2016 Document Reviewed: 10/12/2015 Elsevier Interactive Patient Education  2017 [employer].  Fall Prevention in the Home Falls can cause injuries. They can happen to people of all ages. There are many things you can do to make your home safe and to help prevent falls. What can I do on the outside of my home? Regularly fix the edges of walkways and driveways and fix any cracks. Remove anything that might make you trip as you walk through a door, such as a raised step or threshold. Trim any bushes or trees on the path to your home. Use bright outdoor lighting. Clear any walking paths of anything that might make someone trip, such as rocks or tools. Regularly check to see if handrails are loose or broken. Make sure that both sides of any steps have handrails. Any raised decks and porches should have guardrails on the edges. Have any leaves, snow, or ice cleared regularly. Use sand or salt on walking paths during winter. Clean up any spills in your garage right away. This includes oil or grease spills. What can I do in the bathroom? Use night lights. Install grab bars by the toilet and in the tub and shower. Do not use towel bars as grab bars. Use non-skid mats or decals in the tub or shower. If you need to sit down in the shower, use a plastic, non-slip stool. Keep the floor dry. Clean up any water that spills on the floor as soon as it happens. Remove soap buildup in the tub or shower regularly. Attach bath mats securely with double-sided non-slip rug  tape. Do not have throw rugs and other things on the floor that can make you trip. What can I do in the bedroom? Use night lights. Make sure that you have a light by your bed that is easy to reach. Do not use any sheets or blankets that are too big for your bed. They should not hang down onto the floor. Have a firm chair that has side arms. You can use this for support while you get dressed. Do not have throw rugs and other things on the floor that can make you trip. What can I do in the kitchen? Clean up any spills right away. Avoid walking on wet floors. Keep items that you use a lot in easy-to-reach places. If you need to reach something above you, use a strong step stool that has a grab bar. Keep electrical cords out of the way. Do not use floor polish or wax that makes floors slippery. If you must use wax, use non-skid floor wax. Do not have throw rugs and other things on the floor that can make you trip. What can I do with my stairs? Do not leave any items on the stairs. Make sure that  there are handrails on both sides of the stairs and use them. Fix handrails that are broken or loose. Make sure that handrails are as long as the stairways. Check any carpeting to make sure that it is firmly attached to the stairs. Fix any carpet that is loose or worn. Avoid having throw rugs at the top or bottom of the stairs. If you do have throw rugs, attach them to the floor with carpet tape. Make sure that you have a light switch at the top of the stairs and the bottom of the stairs. If you do not have them, ask someone to add them for you. What else can I do to help prevent falls? Wear shoes that: Do not have high heels. Have rubber bottoms. Are comfortable and fit you well. Are closed at the toe. Do not wear sandals. If you use a stepladder: Make sure that it is fully opened. Do not climb a closed stepladder. Make sure that both sides of the stepladder are locked into place. Ask someone to  hold it for you, if possible. Clearly mark and make sure that you can see: Any grab bars or handrails. First and last steps. Where the edge of each step is. Use tools that help you move around (mobility aids) if they are needed. These include: Canes. Walkers. Scooters. Crutches. Turn on the lights when you go into a dark area. Replace any light bulbs as soon as they burn out. Set up your furniture so you have a clear path. Avoid moving your furniture around. If any of your floors are uneven, fix them. If there are any pets around you, be aware of where they are. Review your medicines with your doctor. Some medicines can make you feel dizzy. This can increase your chance of falling. Ask your doctor what other things that you can do to help prevent falls. This information is not intended to replace advice given to you by your health care provider. Make sure you discuss any questions you have with your health care provider. Document Released: 10/07/2009 Document Revised: 05/18/2016 Document Reviewed: 01/15/2015 Elsevier Interactive Patient Education  2017 [employer].

## 2023-12-31 NOTE — Progress Notes (Signed)
 Subjective:   Howard Fisher is a 83 y.o. male who presents for Medicare Annual/Subsequent preventive examination.  Visit Complete: Virtual I connected with  Howard Fisher on 12/31/23 by a audio enabled telemedicine application and verified that I am speaking with the correct person using two identifiers.  Patient Location: Home  Provider Location: Office/Clinic  I discussed the limitations of evaluation and management by telemedicine. The patient expressed understanding and agreed to proceed.  Vital Signs: Because this visit was a virtual/telehealth visit, some criteria may be missing or patient reported. Any vitals not documented were not able to be obtained and vitals that have been documented are patient reported.  Cardiac Risk Factors include: advanced age (>68men, >61 women);diabetes mellitus;dyslipidemia;hypertension;male gender     Objective:    There were no vitals filed for this visit. There is no height or weight on file to calculate BMI.     12/31/2023    1:01 PM 12/12/2022    1:43 PM 12/01/2021   10:25 AM 09/19/2021   10:12 AM 06/08/2015    9:16 PM 04/20/2015    9:37 AM 06/10/2014    9:42 AM  Advanced Directives  Does Patient Have a Medical Advance Directive? Yes Yes Yes No No Yes Patient has advance directive, copy not in chart  Type of Advance Directive Healthcare Power of [place];Living will Healthcare Power of [place];Living will Healthcare Power of [place];Living will   Healthcare Power of [place];Living will Living will  Does patient want to make changes to medical advance directive?  No - Patient declined    No - Patient declined   Copy of Healthcare Power of Attorney in Chart? No - copy requested No - copy requested No - copy requested   No - copy requested   Would patient like information on creating a medical advance directive?     No - patient declined information      Current Medications (verified) Outpatient Encounter Medications as of 12/31/2023   Medication Sig   carbidopa -levodopa  (SINEMET  IR) 25-100 MG tablet Take 1 tablet by mouth 4 (four) times daily.   Cholecalciferol (D3 PO) Take 1 capsule by mouth daily.   clopidogrel  (PLAVIX ) 75 MG tablet Take 1 tablet (75 mg total) by mouth daily.   fenofibrate  (TRICOR ) 48 MG tablet Take 1 tablet (48 mg total) by mouth daily.   fluticasone  (FLONASE ) 50 MCG/ACT nasal spray Place 2 sprays into both nostrils daily.   lisinopril -hydrochlorothiazide  (ZESTORETIC ) 20-12.5 MG tablet TAKE 1 TABLET DAILY   metFORMIN  (GLUCOPHAGE -XR) 500 MG 24 hr tablet Take 2 tablets (1,000 mg total) by mouth daily with breakfast.   omeprazole  (PRILOSEC) 20 MG capsule TAKE 1 CAPSULE DAILY   rasagiline  (AZILECT ) 1 MG TABS tablet Take 1 tablet (1 mg total) by mouth daily.   simvastatin  (ZOCOR ) 40 MG tablet Take 40 mg by mouth at bedtime.   TURMERIC CURCUMIN PO Take 1 tablet by mouth daily.   No facility-administered encounter medications on file as of 12/31/2023.    Allergies (verified) Penicillins   History: 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   Diabetes mellitus without complication (HCC)    GERD (gastroesophageal reflux disease)    Hiatal hernia    History of stomach ulcers    1985   Hypercholesteremia    Hypertension    Melanoma (HCC) 2014   shoulder   Parkinson disease (HCC)    Peyronie's disease  Stroke [facility])    Past Surgical History:  Procedure Laterality Date   basal cell removed from face  12/2012   CATARACT EXTRACTION Bilateral 11/2009   Dr. Carrie   CHOLECYSTECTOMY  02/1993   Dr. Eletha   right hernia repair  2002   Dr. Effie   skin cancer removed from right shoulder  11/2013   melanoma   Family History  Problem Relation Age of Onset   Diabetes Mother    Dementia Mother    Dementia Father    Diabetes Sister    Healthy Son        x2   Healthy Daughter        x1   Colon cancer Neg Hx    Social History    Socioeconomic History   Marital status: Married    Spouse name: Idell   Number of children: 3   Years of education: One year college   Highest education level: Not on file  Occupational History   Occupation: retired naval architect  Tobacco Use   Smoking status: Never   Smokeless tobacco: Never  Vaping Use   Vaping status: Never Used  Substance and Sexual Activity   Alcohol use: Yes    Alcohol/week: 1.0 standard drink of alcohol    Types: 1 Glasses of wine per week    Comment: 3 drinks per week   Drug use: No   Sexual activity: Yes  Other Topics Concern   Not on file  Social History Narrative   Lives at home with his wife.   Right-handed.   2 cups coffee in the morning.   Social Drivers of Corporate Investment Banker Strain: Low Risk  (12/31/2023)   Overall Financial Resource Strain (CARDIA)    Difficulty of Paying Living Expenses: Not very hard  Food Insecurity: No Food Insecurity (12/31/2023)   Hunger Vital Sign    Worried About Running Out of Food in the Last Year: Never true    Ran Out of Food in the Last Year: Never true  Transportation Needs: No Transportation Needs (12/31/2023)   PRAPARE - Administrator, Civil Service (Medical): No    Lack of Transportation (Non-Medical): No  Physical Activity: Insufficiently Active (12/31/2023)   Exercise Vital Sign    Days of Exercise per Week: 3 days    Minutes of Exercise per Session: 30 min  Stress: No Stress Concern Present (12/31/2023)   [employer] of Occupational Health - Occupational Stress Questionnaire    Feeling of Stress : Only a little  Social Connections: Moderately Integrated (12/31/2023)   Social Connection and Isolation Panel [NHANES]    Frequency of Communication with Friends and Family: More than three times a week    Frequency of Social Gatherings with Friends and Family: Three times a week    Attends Religious Services: Never    Active Member of Clubs or Organizations: Yes    Attends Museum/gallery Exhibitions Officer: More than 4 times per year    Marital Status: Married    Tobacco Counseling Counseling given: Not Answered   Clinical Intake:  Pre-visit preparation completed: Yes  Pain : No/denies pain  Nutritional Risks: None Diabetes: Yes CBG done?: No Did pt. bring in CBG monitor from home?: No  How often do you need to have someone help you when you read instructions, pamphlets, or other written materials from your doctor or pharmacy?: 1 - Never  Interpreter Needed?: No  Information entered by :: [employer],  CMA   Activities of Daily Living    12/31/2023    1:02 PM  In your present state of health, do you have any difficulty performing the following activities:  Hearing? 0  Vision? 0  Difficulty concentrating or making decisions? 0  Walking or climbing stairs? 0  Dressing or bathing? 0  Doing errands, shopping? 0  Preparing Food and eating ? N  Using the Toilet? N  In the past six months, have you accidently leaked urine? Y  Do you have problems with loss of bowel control? N  Managing your Medications? N  Managing your Finances? N  Housekeeping or managing your Housekeeping? N    Patient Care Team: Frann Mabel Mt, DO as PCP - General (Family Medicine) Pyrtle, Gordy HERO, MD as Consulting Physician (Gastroenterology)  Indicate any recent Medical Services you may have received from other than [facility] providers in the past year (date may be approximate).     Assessment:   This is a routine wellness examination for [place].  Hearing/Vision screen No results found.   Goals Addressed   None    Depression Screen    12/31/2023    1:11 PM 07/04/2023   10:44 AM 12/12/2022    1:48 PM 05/02/2022    1:18 PM 12/01/2021   10:29 AM 06/13/2021    2:06 PM 08/18/2020    2:24 PM  PHQ 2/9 Scores  PHQ - 2 Score 0 0 0 0 0 0 0  PHQ- 9 Score  0         Fall Risk    12/31/2023    1:02 PM 07/04/2023   10:44 AM 03/06/2023    4:20 PM 12/12/2022    1:45 PM 05/02/2022     1:17 PM  Fall Risk   Falls in the past year? 0 0 0 0 0  Number falls in past yr: 0 0 0 0 0  Injury with Fall? 0 0 0 0 0  Risk for fall due to : No Fall Risks No Fall Risks No Fall Risks No Fall Risks No Fall Risks  Follow up Falls evaluation completed Falls evaluation completed Falls evaluation completed Falls evaluation completed Falls evaluation completed    MEDICARE RISK AT HOME: Medicare Risk at Home Any stairs in or around the home?: No If so, are there any without handrails?: No Home free of loose throw rugs in walkways, pet beds, electrical cords, etc?: Yes Adequate lighting in your home to reduce risk of falls?: Yes Life alert?: No Use of a cane, walker or w/c?: No Grab bars in the bathroom?: No Shower chair or bench in shower?: Yes Elevated toilet seat or a handicapped toilet?: No  TIMED UP AND GO:  Was the test performed?  No    Cognitive Function:        12/31/2023    1:12 PM 12/12/2022    1:53 PM  6CIT Screen  What Year? 0 points 0 points  What month? 0 points 0 points  What time? 0 points 0 points  Count back from 20 0 points 0 points  Months in reverse 0 points 0 points  Repeat phrase 0 points 0 points  Total Score 0 points 0 points    Immunizations Immunization History  Administered Date(s) Administered   Influenza-Unspecified 11/28/2021   PFIZER(Purple Top)SARS-COV-2 Vaccination 01/14/2020, 02/04/2020, 12/13/2020   Pneumococcal Conjugate-13 01/17/2015   Pneumococcal Polysaccharide-23 01/19/2016   Tdap 01/03/2012    TDAP status: Due, Education has been provided regarding the importance  of this vaccine. Advised may receive this vaccine at local pharmacy or Health Dept. Aware to provide a copy of the vaccination record if obtained from local pharmacy or Health Dept. Verbalized acceptance and understanding.  Flu Vaccine status: Declined, Education has been provided regarding the importance of this vaccine but patient still declined. Advised may  receive this vaccine at local pharmacy or Health Dept. Aware to provide a copy of the vaccination record if obtained from local pharmacy or Health Dept. Verbalized acceptance and understanding.  Pneumococcal vaccine status: Up to date  Covid-19 vaccine status: Information provided on how to obtain vaccines.   Qualifies for Shingles Vaccine? Yes   Zostavax completed No   Shingrix Completed?: No.    Education has been provided regarding the importance of this vaccine. Patient has been advised to call insurance company to determine out of pocket expense if they have not yet received this vaccine. Advised may also receive vaccine at local pharmacy or Health Dept. Verbalized acceptance and understanding.  Screening Tests Health Maintenance  Topic Date Due   Zoster Vaccines- Shingrix (1 of 2) Never done   DTaP/Tdap/Td (2 - Td or Tdap) 01/02/2022   Medicare Annual Wellness (AWV)  12/13/2023   HEMOGLOBIN A1C  01/04/2024   OPHTHALMOLOGY EXAM  02/04/2024   Diabetic kidney evaluation - eGFR measurement  07/03/2024   Diabetic kidney evaluation - Urine ACR  07/03/2024   FOOT EXAM  07/03/2024   Pneumonia Vaccine 58+ Years old  Completed   HPV VACCINES  Aged Out   INFLUENZA VACCINE  Discontinued   Colonoscopy  Discontinued   COVID-19 Vaccine  Discontinued    Health Maintenance  Health Maintenance Due  Topic Date Due   Zoster Vaccines- Shingrix (1 of 2) Never done   DTaP/Tdap/Td (2 - Td or Tdap) 01/02/2022   Medicare Annual Wellness (AWV)  12/13/2023    Colorectal cancer screening: No longer required.   Lung Cancer Screening: (Low Dose CT Chest recommended if Age 44-80 years, 20 pack-year currently smoking OR have quit w/in 15years.) does not qualify.   Additional Screening:  Hepatitis C Screening: does not qualify  Vision Screening: Recommended annual ophthalmology exams for early detection of glaucoma and other disorders of the eye. Is the patient up to date with their annual eye  exam?  Yes  Who is the provider or what is the name of the office in which the patient attends annual eye exams? Dr. Renate If pt is not established with a provider, would they like to be referred to a provider to establish care? No .   Dental Screening: Recommended annual dental exams for proper oral hygiene  Diabetic Foot Exam: Diabetic Foot Exam: Completed 07/04/23  Community Resource Referral / Chronic Care Management: CRR required this visit?  No   CCM required this visit?  No     Plan:     I have personally reviewed and noted the following in the patient's chart:   Medical and social history Use of alcohol, tobacco or illicit drugs  Current medications and supplements including opioid prescriptions. Patient is not currently taking opioid prescriptions. Functional ability and status Nutritional status Physical activity Advanced directives List of other physicians Hospitalizations, surgeries, and ER visits in previous 12 months Vitals Screenings to include cognitive, depression, and falls Referrals and appointments  In addition, I have reviewed and discussed with patient certain preventive protocols, quality metrics, and best practice recommendations. A written personalized care plan for preventive services as well as general preventive  health recommendations were provided to patient.     Kandis Gauze, CMA   12/31/2023   After Visit Summary: (Declined) Due to this being a telephonic visit, with patients personalized plan was offered to patient but patient Declined AVS at this time   Nurse Notes: None

## 2024-01-09 ENCOUNTER — Ambulatory Visit: Payer: Medicare HMO | Admitting: Family Medicine

## 2024-01-09 ENCOUNTER — Other Ambulatory Visit: Payer: Self-pay | Admitting: Family Medicine

## 2024-01-09 ENCOUNTER — Encounter: Payer: Self-pay | Admitting: Family Medicine

## 2024-01-09 ENCOUNTER — Other Ambulatory Visit: Payer: Self-pay

## 2024-01-09 VITALS — BP 138/86 | HR 92 | Temp 97.8°F | Resp 16 | Ht 73.0 in | Wt 214.8 lb

## 2024-01-09 DIAGNOSIS — E1165 Type 2 diabetes mellitus with hyperglycemia: Secondary | ICD-10-CM

## 2024-01-09 DIAGNOSIS — Z0000 Encounter for general adult medical examination without abnormal findings: Secondary | ICD-10-CM

## 2024-01-09 LAB — CBC
HCT: 48.2 % (ref 39.0–52.0)
Hemoglobin: 16.4 g/dL (ref 13.0–17.0)
MCHC: 34 g/dL (ref 30.0–36.0)
MCV: 95.4 fL (ref 78.0–100.0)
Platelets: 200 10*3/uL (ref 150.0–400.0)
RBC: 5.06 Mil/uL (ref 4.22–5.81)
RDW: 13.4 % (ref 11.5–15.5)
WBC: 10.2 10*3/uL (ref 4.0–10.5)

## 2024-01-09 LAB — COMPREHENSIVE METABOLIC PANEL
ALT: 14 U/L (ref 0–53)
AST: 26 U/L (ref 0–37)
Albumin: 4.6 g/dL (ref 3.5–5.2)
Alkaline Phosphatase: 70 U/L (ref 39–117)
BUN: 10 mg/dL (ref 6–23)
CO2: 28 meq/L (ref 19–32)
Calcium: 9.5 mg/dL (ref 8.4–10.5)
Chloride: 102 meq/L (ref 96–112)
Creatinine, Ser: 0.99 mg/dL (ref 0.40–1.50)
GFR: 70.32 mL/min (ref >60.00)
Glucose, Bld: 200 mg/dL — ABNORMAL HIGH (ref 70–99)
Potassium: 3.8 meq/L (ref 3.5–5.1)
Sodium: 140 meq/L (ref 135–145)
Total Bilirubin: 0.5 mg/dL (ref 0.2–1.2)
Total Protein: 7 g/dL (ref 6.0–8.3)

## 2024-01-09 LAB — LIPID PANEL
Cholesterol: 216 mg/dL — ABNORMAL HIGH (ref 0–200)
HDL: 37 mg/dL — ABNORMAL LOW (ref >39.00)
LDL Cholesterol: 101 mg/dL — ABNORMAL HIGH (ref 0–99)
NonHDL: 179.31
Total CHOL/HDL Ratio: 6
Triglycerides: 393 mg/dL — ABNORMAL HIGH (ref 0.0–149.0)
VLDL: 78.6 mg/dL — ABNORMAL HIGH (ref 0.0–40.0)

## 2024-01-09 LAB — HEMOGLOBIN A1C: Hgb A1c MFr Bld: 7.6 % — ABNORMAL HIGH (ref 4.6–6.5)

## 2024-01-09 MED ORDER — OMEPRAZOLE 40 MG PO CPDR: 40.0000 mg | DELAYED_RELEASE_CAPSULE | Freq: Every day | ORAL | 3 refills | Status: DC

## 2024-01-09 MED ORDER — METFORMIN HCL ER 500 MG PO TB24: 1000.0000 mg | ORAL_TABLET | Freq: Every day | ORAL | 2 refills | Status: DC

## 2024-01-09 NOTE — Progress Notes (Signed)
 Chief Complaint  Patient presents with   Annual Exam    Annual Exam    Well Male Howard Fisher is here for a complete physical.   His last physical was >1 year ago.  Current diet: in general, a "healthy" diet.   Current exercise: some golfing, walking Weight trend: stable Fatigue out of ordinary? No. Seat belt? Yes.   Advanced directive? No  Health maintenance Shingrix- No Tetanus- Due Pneumonia vaccine- Yes  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   Diabetes mellitus without complication (HCC)    GERD (gastroesophageal reflux disease)    Hiatal hernia    History of stomach ulcers    1985   Hypercholesteremia    Hypertension    Melanoma (HCC) 2014   shoulder   Parkinson disease (HCC)    Peyronie's disease    Stroke [facility])      Past Surgical History:  Procedure Laterality Date   basal cell removed from face  12/2012   CATARACT EXTRACTION Bilateral 11/2009   Dr. Neil Balls   CHOLECYSTECTOMY  02/1993   Dr. Sofia Dunn   right hernia repair  2002   Dr. Duwaine Gins   skin cancer removed from right shoulder  11/2013   melanoma    Medications  Current Outpatient Medications on File Prior to Visit  Medication Sig Dispense Refill   carbidopa -levodopa  (SINEMET  IR) 25-100 MG tablet Take 1 tablet by mouth 4 (four) times daily. 360 tablet 4   Cholecalciferol (D3 PO) Take 1 capsule by mouth daily.     clopidogrel  (PLAVIX ) 75 MG tablet Take 1 tablet (75 mg total) by mouth daily. 90 tablet 3   fenofibrate  (TRICOR ) 48 MG tablet Take 1 tablet (48 mg total) by mouth daily. 90 tablet 0   fluticasone  (FLONASE ) 50 MCG/ACT nasal spray Place 2 sprays into both nostrils daily. 16 g 6   lisinopril -hydrochlorothiazide  (ZESTORETIC ) 20-12.5 MG tablet TAKE 1 TABLET DAILY 90 tablet 3   metFORMIN  (GLUCOPHAGE -XR) 500 MG 24 hr tablet Take 2 tablets (1,000 mg total) by mouth daily with breakfast. 180 tablet 2   omeprazole  (PRILOSEC) 20 MG capsule  TAKE 1 CAPSULE DAILY 90 capsule 3   rasagiline  (AZILECT ) 1 MG TABS tablet Take 1 tablet (1 mg total) by mouth daily. 90 tablet 3   simvastatin  (ZOCOR ) 40 MG tablet Take 40 mg by mouth at bedtime.     TURMERIC CURCUMIN PO Take 1 tablet by mouth daily.      Allergies Allergies  Allergen Reactions   Penicillins     REACTION: rash and itching    Family History Family History  Problem Relation Age of Onset   Diabetes Mother    Dementia Mother    Dementia Father    Diabetes Sister    Healthy Son        x2   Healthy Daughter        x1   Colon cancer Neg Hx     Review of Systems: Constitutional:  no fevers Eye:  no recent significant change in vision Ears:  No changes in hearing Nose/Mouth/Throat:  no complaints of nasal congestion, no sore throat Cardiovascular: no chest pain Respiratory:  No shortness of breath Gastrointestinal:  +reflux GU:  No frequency Integumentary:  no abnormal skin lesions reported Neurologic:  no headaches Endocrine:  denies unexplained weight changes  Exam BP 138/86   Pulse 92   Temp 97.8 F (36.6 C) (Oral)  Resp 16   Ht 6\' 1"  (1.854 m)   Wt 214 lb 12.8 oz (97.4 kg)   SpO2 96%   BMI 28.34 kg/m  General:  well developed, well nourished, in no apparent distress Skin:  no significant moles, warts, or growths Head:  no masses, lesions, or tenderness Eyes:  pupils equal and round, sclera anicteric without injection Ears:  canals without lesions, TMs shiny without retraction, no obvious effusion, no erythema Nose:  nares patent, mucosa normal Throat/Pharynx:  lips and gingiva without lesion; tongue and uvula midline; non-inflamed pharynx; no exudates or postnasal drainage Lungs:  clear to auscultation, breath sounds equal bilaterally, no respiratory distress Cardio:  regular rate and rhythm, no LE edema or bruits Rectal: Deferred GI: BS+, S, NT, ND, no masses or organomegaly Musculoskeletal:  symmetrical muscle groups noted without atrophy  or deformity Neuro:  gait normal; deep tendon reflexes normal and symmetric, +RUE resting tremor Psych: well oriented with normal range of affect and appropriate judgment/insight  Assessment and Plan  Well adult exam  Type 2 diabetes mellitus with hyperglycemia, without long-term current use of insulin (HCC) - Plan: CBC, Comprehensive metabolic panel, Lipid panel, Hemoglobin A1c   Well 83 y.o. male. Counseled on diet and exercise. Other orders as above. Tetanus booster and Shingrix rec'd, to get at pharmacy.  Advanced directive form requested today.  Follow up in 6 mo.  The patient voiced understanding and agreement to the plan.  Shellie Dials [place], DO 01/09/24 9:59 AM

## 2024-01-09 NOTE — Patient Instructions (Signed)
 Give us  2-3 business days to get the results of your labs back.   Keep the diet clean and stay active.  Consider getting the tetanus booster at the pharmacy.  The Shingrix vaccine (for shingles) is a 2 shot series spaced 2-6 months apart. It can make people feel low energy, achy and almost like they have the flu for 48 hours after injection. 1/5 people can have nausea and/or vomiting. Please plan accordingly when deciding on when to get this shot. Call your pharmacy to get this. The second shot of the series is less severe regarding the side effects, but it still lasts 48 hours.   Please get me a copy of your advanced directive form at your convenience.   Let us  know if you need anything.

## 2024-01-10 ENCOUNTER — Other Ambulatory Visit: Payer: Self-pay | Admitting: Family Medicine

## 2024-01-10 MED ORDER — FENOFIBRATE 145 MG PO TABS: 145.0000 mg | ORAL_TABLET | Freq: Every day | ORAL | 3 refills | Status: DC

## 2024-01-11 ENCOUNTER — Other Ambulatory Visit: Payer: Self-pay

## 2024-01-11 MED ORDER — FENOFIBRATE 145 MG PO TABS: 145.0000 mg | ORAL_TABLET | Freq: Every day | ORAL | 3 refills | Status: DC

## 2024-01-22 ENCOUNTER — Telehealth: Payer: Self-pay | Admitting: Family Medicine

## 2024-01-22 NOTE — Telephone Encounter (Signed)
Called pt was advised I don't call and last call was from 01/09/2024 for lab recheck. Pt stated he isn't sure.

## 2024-01-22 NOTE — Telephone Encounter (Signed)
Patient is calling in to see why is he getting a call about a Dec 18 2023 dr appt he stated he did not have a appt for this date

## 2024-02-04 ENCOUNTER — Other Ambulatory Visit (HOSPITAL_BASED_OUTPATIENT_CLINIC_OR_DEPARTMENT_OTHER): Payer: Self-pay

## 2024-02-04 MED ORDER — TETANUS-DIPHTH-ACELL PERTUSSIS 5-2.5-18.5 LF-MCG/0.5 IM SUSY
0.5000 mL | PREFILLED_SYRINGE | Freq: Once | INTRAMUSCULAR | 0 refills | Status: AC
  Filled 2024-02-04: qty 0.5, 1d supply, fill #0

## 2024-02-18 ENCOUNTER — Other Ambulatory Visit (INDEPENDENT_AMBULATORY_CARE_PROVIDER_SITE_OTHER): Payer: Medicare HMO

## 2024-02-18 DIAGNOSIS — E1165 Type 2 diabetes mellitus with hyperglycemia: Secondary | ICD-10-CM

## 2024-02-19 ENCOUNTER — Telehealth: Payer: Self-pay | Admitting: *Deleted

## 2024-02-19 LAB — LIPID PANEL
Cholesterol: 164 mg/dL (ref 0–200)
HDL: 39.8 mg/dL (ref >39.00)
LDL Cholesterol: 88 mg/dL (ref 0–99)
NonHDL: 124.13
Total CHOL/HDL Ratio: 4
Triglycerides: 183 mg/dL — ABNORMAL HIGH (ref 0.0–149.0)
VLDL: 36.6 mg/dL (ref 0.0–40.0)

## 2024-02-19 NOTE — Telephone Encounter (Signed)
 Copied from CRM [id number]. Topic: Clinical - Lab/Test Results >> Feb 19, 2024  1:03 PM [place] J wrote: Reason for CRM: Patient returning call back regarding his test results. I relayed over Doctor's note and patient did not want to change his Zocor.

## 2024-03-13 ENCOUNTER — Ambulatory Visit: Payer: Medicare HMO | Admitting: Neurology

## 2024-06-23 ENCOUNTER — Other Ambulatory Visit: Payer: Self-pay | Admitting: Family Medicine

## 2024-07-07 ENCOUNTER — Ambulatory Visit (INDEPENDENT_AMBULATORY_CARE_PROVIDER_SITE_OTHER): Payer: Medicare HMO | Admitting: Family Medicine

## 2024-07-07 ENCOUNTER — Encounter: Payer: Self-pay | Admitting: Family Medicine

## 2024-07-07 VITALS — BP 128/76 | HR 74 | Temp 98.0°F | Resp 16 | Ht 73.0 in | Wt 203.0 lb

## 2024-07-07 DIAGNOSIS — E7800 Pure hypercholesterolemia, unspecified: Secondary | ICD-10-CM

## 2024-07-07 DIAGNOSIS — I10 Essential (primary) hypertension: Secondary | ICD-10-CM | POA: Diagnosis not present

## 2024-07-07 DIAGNOSIS — E1165 Type 2 diabetes mellitus with hyperglycemia: Secondary | ICD-10-CM

## 2024-07-07 DIAGNOSIS — Z7984 Long term (current) use of oral hypoglycemic drugs: Secondary | ICD-10-CM | POA: Diagnosis not present

## 2024-07-07 LAB — COMPREHENSIVE METABOLIC PANEL WITH GFR
ALT: 15 U/L (ref 0–53)
AST: 39 U/L — ABNORMAL HIGH (ref 0–37)
Albumin: 4.4 g/dL (ref 3.5–5.2)
Alkaline Phosphatase: 53 U/L (ref 39–117)
BUN: 20 mg/dL (ref 6–23)
CO2: 30 meq/L (ref 19–32)
Calcium: 9.5 mg/dL (ref 8.4–10.5)
Chloride: 102 meq/L (ref 96–112)
Creatinine, Ser: 1.13 mg/dL (ref 0.40–1.50)
GFR: 59.79 mL/min — ABNORMAL LOW (ref >60.00)
Glucose, Bld: 123 mg/dL — ABNORMAL HIGH (ref 70–99)
Potassium: 3.9 meq/L (ref 3.5–5.1)
Sodium: 139 meq/L (ref 135–145)
Total Bilirubin: 0.5 mg/dL (ref 0.2–1.2)
Total Protein: 6.5 g/dL (ref 6.0–8.3)

## 2024-07-07 LAB — LIPID PANEL
Cholesterol: 146 mg/dL (ref 0–200)
HDL: 40.6 mg/dL (ref >39.00)
LDL Cholesterol: 78 mg/dL (ref 0–99)
NonHDL: 105.24
Total CHOL/HDL Ratio: 4
Triglycerides: 137 mg/dL (ref 0.0–149.0)
VLDL: 27.4 mg/dL (ref 0.0–40.0)

## 2024-07-07 LAB — HEMOGLOBIN A1C: Hgb A1c MFr Bld: 6.8 % — ABNORMAL HIGH (ref 4.6–6.5)

## 2024-07-07 LAB — MICROALBUMIN / CREATININE URINE RATIO
Creatinine,U: 111.2 mg/dL
Microalb Creat Ratio: UNDETERMINED mg/g (ref 0.0–30.0)
Microalb, Ur: <0.7 mg/dL

## 2024-07-07 MED ORDER — METFORMIN HCL ER 500 MG PO TB24: 1000.0000 mg | ORAL_TABLET | Freq: Every day | ORAL | 3 refills | Status: DC

## 2024-07-07 MED ORDER — SIMVASTATIN 40 MG PO TABS: 40.0000 mg | ORAL_TABLET | Freq: Every day | ORAL | 3 refills | Status: DC

## 2024-07-07 NOTE — Progress Notes (Signed)
 Subjective:   Chief Complaint  Patient presents with   Diabetes    Diabetes     Howard Fisher is a 83 y.o. male here for follow-up of diabetes.   Howard Fisher does not routinely check his sugars.  Patient does not require insulin.   Medications include: metformin  XR 1000 mg/d Diet is fair.  Exercise: walking  Hypertension Patient presents for hypertension follow up. He does not monitor home blood pressures. He is compliant with medications- Zestoretic  20-12.5 mg/d. Patient has these side effects of medication: none Diet/exercise as above.  No CP or SOB.   Hyperlipidemia Patient presents for hyperlipidemia follow up. Currently being treated with Zocor  40 mg/d, Tricor  145 mg/d and compliance with treatment thus far has been good. He denies myalgias. Diet/exercise as above.  The patient is not known to have coexisting coronary artery disease.  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   Diabetes mellitus without complication (HCC)    GERD (gastroesophageal reflux disease)    Hiatal hernia    History of stomach ulcers    1985   Hypercholesteremia    Hypertension    Melanoma (HCC) 2014   shoulder   Parkinson disease (HCC)    Peyronie's disease    Stroke (HCC)      Related testing: Retinal exam: Done Pneumovax: done  Objective:  BP 128/76 (BP Location: Left Arm, Patient Position: Sitting)   Pulse 74   Temp 98 F (36.7 C) (Oral)   Resp 16   Ht 6' 1 (1.854 m)   Wt 203 lb (92.1 kg)   SpO2 98%   BMI 26.78 kg/m  General:  Well developed, well nourished, in no apparent distress Skin:  Warm, no pallor or diaphoresis Head:  Normocephalic, atraumatic Eyes:  Pupils equal and round, sclera anicteric without injection  Lungs:  CTAB, no access msc use Cardio:  RRR, no bruits, no LE edema Musculoskeletal:  Symmetrical muscle groups noted without atrophy or deformity Neuro:  Sensation intact to pinprick on feet with  exception of lateral right foot plantar surface Psych: Age appropriate judgment and insight  Assessment:   Type 2 diabetes mellitus with hyperglycemia, without long-term current use of insulin (HCC) - Plan: Comprehensive metabolic panel with GFR, Lipid panel, Hemoglobin A1c, Microalbumin / creatinine urine ratio, metFORMIN  (GLUCOPHAGE -XR) 500 MG 24 hr tablet  Essential hypertension  HYPERCHOLESTEROLEMIA   Plan:   Chronic, stable.  Continue metformin  XR 1000 mg daily.  Counseled on diet and exercise. Chronic, stable.  Continue Zestoretic  20-12.5 mg daily. Chronic, stable.  Continue Zocor  40 mg daily, Tricor  145 mg daily. F/u in 6 mo. The patient voiced understanding and agreement to the plan.  Howard Fisher [place], DO 07/07/24 1:03 PM

## 2024-07-07 NOTE — Patient Instructions (Signed)
 Give us  2-3 business days to get the results of your labs back.   Keep the diet clean and stay active.  Stay hydrated.  For the muscle cramping, drink lots of fluids. Also take a spoonful of pickle juice nightly. An alternative would be a teaspoon of mustard, but most people prefer pickle juice.   Let us  know if you need anything.

## 2024-07-08 ENCOUNTER — Ambulatory Visit: Payer: Self-pay | Admitting: Family Medicine

## 2024-07-08 ENCOUNTER — Other Ambulatory Visit: Payer: Self-pay

## 2024-07-08 DIAGNOSIS — R748 Abnormal levels of other serum enzymes: Secondary | ICD-10-CM

## 2024-07-16 ENCOUNTER — Ambulatory Visit: Payer: Medicare HMO | Admitting: Neurology

## 2024-07-21 ENCOUNTER — Other Ambulatory Visit (INDEPENDENT_AMBULATORY_CARE_PROVIDER_SITE_OTHER)

## 2024-07-21 ENCOUNTER — Other Ambulatory Visit

## 2024-07-21 DIAGNOSIS — R748 Abnormal levels of other serum enzymes: Secondary | ICD-10-CM | POA: Diagnosis not present

## 2024-07-22 ENCOUNTER — Ambulatory Visit: Payer: Self-pay | Admitting: Family Medicine

## 2024-07-22 DIAGNOSIS — R7401 Elevation of levels of liver transaminase levels: Secondary | ICD-10-CM

## 2024-07-22 LAB — HEPATIC FUNCTION PANEL
ALT: 29 U/L (ref 0–53)
AST: 46 U/L — ABNORMAL HIGH (ref 0–37)
Albumin: 4.5 g/dL (ref 3.5–5.2)
Alkaline Phosphatase: 51 U/L (ref 39–117)
Bilirubin, Direct: 0.1 mg/dL (ref 0.0–0.3)
Total Bilirubin: 0.6 mg/dL (ref 0.2–1.2)
Total Protein: 6.8 g/dL (ref 6.0–8.3)

## 2024-07-23 ENCOUNTER — Telehealth: Payer: Self-pay

## 2024-07-23 NOTE — Telephone Encounter (Signed)
 Copied from CRM [id number]. Topic: General - Other >> Jul 23, 2024  9:45 AM Carlyon D wrote: Reason for CRM: Pt is calling back returning a call he missed yesterday from Dr. Konrad nurse and pt would like a call back he states its in regards to his appt. And would like some one to reach out to him as he wants to clarify a couple things from the previous phone call with the nurse.  Called pt was advised we ordered US , Pt stated understand. Number was provided to imaging department.

## 2024-07-27 ENCOUNTER — Ambulatory Visit (HOSPITAL_BASED_OUTPATIENT_CLINIC_OR_DEPARTMENT_OTHER)
Admission: RE | Admit: 2024-07-27 | Discharge: 2024-07-27 | Disposition: A | Discharge status: Home / Self Care | Source: Ambulatory Visit | Attending: Family Medicine

## 2024-07-27 DIAGNOSIS — N281 Cyst of kidney, acquired: Secondary | ICD-10-CM | POA: Diagnosis not present

## 2024-07-27 DIAGNOSIS — Z9049 Acquired absence of other specified parts of digestive tract: Secondary | ICD-10-CM | POA: Diagnosis not present

## 2024-07-27 DIAGNOSIS — R7401 Elevation of levels of liver transaminase levels: Secondary | ICD-10-CM | POA: Insufficient documentation

## 2024-07-28 ENCOUNTER — Telehealth: Payer: Self-pay | Admitting: Neurology

## 2024-07-28 NOTE — Telephone Encounter (Signed)
 Appointment details confirmed

## 2024-07-31 ENCOUNTER — Ambulatory Visit: Payer: Self-pay | Admitting: Family Medicine

## 2024-08-08 ENCOUNTER — Telehealth: Payer: Self-pay

## 2024-08-08 NOTE — Telephone Encounter (Signed)
 Copied from CRM #[id number]. Topic: Clinical - Lab/Test Results >> Aug 07, 2024  2:59 PM Chasity T wrote: Reason for CRM: Patient is requesting a follow up on recent imaging results. Please contact him back for further discussion.

## 2024-08-11 NOTE — Telephone Encounter (Signed)
 Noted

## 2024-09-23 ENCOUNTER — Telehealth: Payer: Self-pay

## 2024-09-23 NOTE — Telephone Encounter (Signed)
 Copied from CRM [id number]. Topic: Clinical - Lab/Test Results >> Sep 23, 2024 11:16 AM Howard Fisher wrote: Reason for CRM: Patient is requesting a callback regarding liver scan. He stated that no one got back with him on it.

## 2024-10-13 ENCOUNTER — Ambulatory Visit: Payer: Self-pay

## 2024-10-13 ENCOUNTER — Other Ambulatory Visit: Payer: Self-pay | Admitting: Family Medicine

## 2024-10-13 NOTE — Telephone Encounter (Signed)
 FYI Only or Action Required?: Action required by provider: request for appointment.  Patient was last seen in primary care on 07/07/2024 by Frann Mabel Mt, DO.  Called Nurse Triage reporting Neurologic Problem.  Symptoms began several weeks ago.  Interventions attempted: Nothing.  Symptoms are: stable.Tingling to left side of face that comes and goes. No other symptoms. States he lost his wife several weeks ago.   Triage Disposition: See HCP Within 4 Hours (Or PCP Triage)  Patient/caregiver understands and will follow disposition?: Yes     Copied from CRM #[id number]. Topic: Clinical - Red Word Triage >> Oct 13, 2024  1:17 PM [place] J wrote: [facility] that prompted transfer to Nurse Triage: Patient's face is tingling. Reason for Disposition  [1] Numbness (i.e., loss of sensation) of the face, arm / hand, or leg / foot on one side of the body AND [2] gradual onset (e.g., days to weeks) AND [3] present now  Answer Assessment - Initial Assessment Questions 1. SYMPTOM: What is the main symptom you are concerned about? (e.g., weakness, numbness)     Tingling face at jaw and neck - left side  2. ONSET: When did this start? (e.g., minutes, hours, days; while sleeping)     4 weeks ago 3. LAST NORMAL: When was the last time you (the patient) were normal (no symptoms)?     4 weeks 4. PATTERN Does this come and go, or has it been constant since it started?  Is it present now?     Comes and goes 5. CARDIAC SYMPTOMS: Have you had any of the following symptoms: chest pain, difficulty breathing, palpitations?     no 6. NEUROLOGIC SYMPTOMS: Have you had any of the following symptoms: headache, dizziness, vision loss, double vision, changes in speech, unsteady on your feet?     no 7. OTHER SYMPTOMS: Do you have any other symptoms?     no 8. PREGNANCY: Is there any chance you are pregnant? When was your last menstrual period?     N/a  Protocols used: Neurologic  Deficit-A-AH

## 2024-10-14 ENCOUNTER — Encounter: Payer: Self-pay | Admitting: Family Medicine

## 2024-10-14 ENCOUNTER — Ambulatory Visit: Admitting: Family Medicine

## 2024-10-14 VITALS — BP 132/84 | HR 89 | Temp 98.0°F | Resp 16 | Ht 73.0 in | Wt 203.0 lb

## 2024-10-14 DIAGNOSIS — R202 Paresthesia of skin: Secondary | ICD-10-CM | POA: Diagnosis not present

## 2024-10-14 NOTE — Progress Notes (Signed)
 Chief Complaint  Patient presents with   Numbness    Numbness     Subjective: Patient is a 83 y.o. male here for tingling of his face.  Over the past 6 weeks, the patient has had tingling on the left side of his face.  His vision is unaffected.  He is chewing without issue.  Eating seems to trigger the symptoms.  Then it goes away.  He has no weakness.  It is now extending into his neck and upper left extremity.  Around 3 weeks ago his wife passed away.  Stress levels have been high.  He denies any trauma to the area.  No problems with salivation.  No swelling, pain, redness, dental complaints, or difficulty swallowing.  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 Neuro: DTRs equal and symmetric throughout, no clonus, no cerebellar signs, gait is cautious, 5/5 strength throughout, sensation intact to light touch and pinprick bilaterally over the upper extremities, hands, face, and neck. Lungs: No accessory muscle use Psych: Age appropriate judgment and insight, normal affect and mood  Assessment and Plan: Paresthesias - Plan: CBC, Comprehensive metabolic panel with GFR, TSH, A87, Magnesium  No motor symptoms.  No pain.  Check labs.  If negative, he will bring it up with his neurology team next week.  This could be related to stress.  We did  talk about psychosomatic manifestations. The patient voiced understanding and agreement to the plan.  Mabel Mt [place], DO 10/14/24  4:45 PM

## 2024-10-14 NOTE — Patient Instructions (Signed)
 Give us  2-3 business days to get the results of your labs back.   This could be stress.  Bring this up to your neurology team if labs are normal.  Let us  know if you need anything.

## 2024-10-15 ENCOUNTER — Other Ambulatory Visit: Payer: Self-pay

## 2024-10-15 ENCOUNTER — Ambulatory Visit: Payer: Self-pay | Admitting: Family Medicine

## 2024-10-15 DIAGNOSIS — R7989 Other specified abnormal findings of blood chemistry: Secondary | ICD-10-CM

## 2024-10-15 DIAGNOSIS — E538 Deficiency of other specified B group vitamins: Secondary | ICD-10-CM

## 2024-10-15 LAB — COMPREHENSIVE METABOLIC PANEL WITH GFR
ALT: 14 U/L (ref 0–53)
AST: 27 U/L (ref 0–37)
Albumin: 4.6 g/dL (ref 3.5–5.2)
Alkaline Phosphatase: 59 U/L (ref 39–117)
BUN: 13 mg/dL (ref 6–23)
CO2: 31 meq/L (ref 19–32)
Calcium: 9.5 mg/dL (ref 8.4–10.5)
Chloride: 99 meq/L (ref 96–112)
Creatinine, Ser: 1.03 mg/dL (ref 0.40–1.50)
GFR: 66.7 mL/min (ref >60.00)
Glucose, Bld: 124 mg/dL — ABNORMAL HIGH (ref 70–99)
Potassium: 3.8 meq/L (ref 3.5–5.1)
Sodium: 141 meq/L (ref 135–145)
Total Bilirubin: 0.6 mg/dL (ref 0.2–1.2)
Total Protein: 6.7 g/dL (ref 6.0–8.3)

## 2024-10-15 LAB — CBC
HCT: 44.6 % (ref 39.0–52.0)
Hemoglobin: 14.9 g/dL (ref 13.0–17.0)
MCHC: 33.4 g/dL (ref 30.0–36.0)
MCV: 94.3 fl (ref 78.0–100.0)
Platelets: 193 K/uL (ref 150.0–400.0)
RBC: 4.73 Mil/uL (ref 4.22–5.81)
RDW: 13.7 % (ref 11.5–15.5)
WBC: 12.4 K/uL — ABNORMAL HIGH (ref 4.0–10.5)

## 2024-10-15 LAB — VITAMIN B12: Vitamin B-12: 141 pg/mL — ABNORMAL LOW (ref 211–911)

## 2024-10-15 LAB — MAGNESIUM: Magnesium: 1.5 mg/dL (ref 1.5–2.5)

## 2024-10-15 LAB — TSH: TSH: 3 u[IU]/mL (ref 0.35–5.50)

## 2024-10-16 ENCOUNTER — Other Ambulatory Visit

## 2024-10-16 DIAGNOSIS — R7989 Other specified abnormal findings of blood chemistry: Secondary | ICD-10-CM

## 2024-10-16 NOTE — Telephone Encounter (Signed)
 I would prefer he comes back for this.  Thank you.

## 2024-10-20 ENCOUNTER — Other Ambulatory Visit

## 2024-10-20 ENCOUNTER — Ambulatory Visit: Payer: Self-pay | Admitting: Family Medicine

## 2024-10-20 ENCOUNTER — Telehealth: Payer: Self-pay | Admitting: Neurology

## 2024-10-20 DIAGNOSIS — E538 Deficiency of other specified B group vitamins: Secondary | ICD-10-CM | POA: Diagnosis not present

## 2024-10-20 LAB — B12 AND FOLATE PANEL
Folate: 8.5 ng/mL (ref >5.9)
Vitamin B-12: 272 pg/mL (ref 211–911)

## 2024-10-20 LAB — CBC WITH DIFFERENTIAL/PLATELET
Basophils Absolute: 0 K/uL (ref 0.0–0.1)
Basophils Relative: 0.3 % (ref 0.0–3.0)
Eosinophils Absolute: 0.2 K/uL (ref 0.0–0.7)
Eosinophils Relative: 1.9 % (ref 0.0–5.0)
HCT: 45.4 % (ref 39.0–52.0)
Hemoglobin: 15 g/dL (ref 13.0–17.0)
Lymphocytes Relative: 39.9 % (ref 12.0–46.0)
Lymphs Abs: 4.6 K/uL — ABNORMAL HIGH (ref 0.7–4.0)
MCHC: 33.1 g/dL (ref 30.0–36.0)
MCV: 94.5 fl (ref 78.0–100.0)
Monocytes Absolute: 0.9 K/uL (ref 0.1–1.0)
Monocytes Relative: 7.9 % (ref 3.0–12.0)
Neutro Abs: 5.8 K/uL (ref 1.4–7.7)
Neutrophils Relative %: 50 % (ref 43.0–77.0)
Platelets: 185 K/uL (ref 150.0–400.0)
RBC: 4.81 Mil/uL (ref 4.22–5.81)
RDW: 13.5 % (ref 11.5–15.5)
WBC: 11.6 K/uL — ABNORMAL HIGH (ref 4.0–10.5)

## 2024-10-20 LAB — INTRINSIC FACTOR BLOCKING ANTIBODY: Intrinsic Factor: NEGATIVE

## 2024-10-20 NOTE — Telephone Encounter (Signed)
 Patient verify appointment and time.

## 2024-10-20 NOTE — Addendum Note (Signed)
 Addended by: ESTELLE GILLIS D on: 10/20/2024 01:54 PM   Modules accepted: Orders

## 2024-10-21 ENCOUNTER — Other Ambulatory Visit: Payer: Self-pay

## 2024-10-21 ENCOUNTER — Ambulatory Visit: Admitting: Neurology

## 2024-10-21 ENCOUNTER — Encounter: Payer: Self-pay | Admitting: Neurology

## 2024-10-21 VITALS — BP 150/90 | HR 75 | Ht 73.0 in | Wt 202.6 lb

## 2024-10-21 DIAGNOSIS — G20A1 Parkinson's disease without dyskinesia, without mention of fluctuations: Secondary | ICD-10-CM

## 2024-10-21 DIAGNOSIS — I639 Cerebral infarction, unspecified: Secondary | ICD-10-CM

## 2024-10-21 DIAGNOSIS — G459 Transient cerebral ischemic attack, unspecified: Secondary | ICD-10-CM

## 2024-10-21 DIAGNOSIS — E538 Deficiency of other specified B group vitamins: Secondary | ICD-10-CM

## 2024-10-21 NOTE — Patient Instructions (Signed)
 Recommend MRI of the brain Continue Plavix  75 mg daily Strict management of vascular risk factors with a goal BP less than 130/90, A1c less than 7.0, LDL less than 70 for secondary stroke prevention Go to the ER for any stroke like symptoms Continue Sinemet  and Azilect  Stay active, exercise Follow up in 6 months

## 2024-10-21 NOTE — Progress Notes (Signed)
 Chief Complaint  Patient presents with   Cerebrovascular Accident    Rm16, alone, Cva:pt stated that they are doing well no concerns   Tremors    Rm16, alone, pt stated  that their tremors are progressively worsening    ASSESSMENT AND PLAN 83 y.o. year old male    1.  Idiopathic Parkinson's disease 2.  TIA March 28, 2023 (right-sided arm and leg numbness; small vessel event involving left thalamus) 3.   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  Hypertension 5.  Diabetes 6.  Hyperlipidemia  -Intermittent tingling to left face for the last month. Recommended repeat MRI brain, patient wants to hold off. Also check carotid US , patient doesn't want this right now (April 2024 40% right ICA from CTA). Continue Plavix  75 mg daily. B12 recently on low end of normal, just started supplement -Strict management of vascular risk factors with a goal BP less than 130/90, A1c less than 7.0, LDL less than 70 for secondary stroke prevention -Encouraged to remain active, involved in golf -Continue Sinemet  25/100 mg 1 tablet 4 times daily -Continue Azilect  1 mg daily -Go to the ER for any acute stroke symptoms -Follow-up with Dr. Onita in 6 months to rotate every few visits with me  DIAGNOSTIC DATA (LABS, IMAGING, TESTING) - I reviewed patient records, labs, notes, testing and imaging myself where available.  MRI of the brain  08/23/23 IMPRESSION: Small acute infarcts in the right thalamocapsular region without hemorrhage or mass effect.  July 2025 A1C 6.8, LDL 78  MEDICAL HISTORY:  Howard Fisher is a 83 year old male, seen in refer by his primary care doctor Frann Mabel Mt, for evaluation of tremor, involving his right arm and right hand, initial evaluation was on October 09 2017.   I have reviewed and summarized referring note, he has history of hypertension, diabetes, hyperlipidemia,   Since May of  2018, he noticed gradual onset right hand tremor, most noticeable when he is resting his right arm, he denies significant weakness, no left hand involvement,   He had a history of right shoulder injury in 2016 after he fell and landed on his right shoulder.   He denies loss sense of smell, he denies REM sleep disorder, no dizziness, chronic constipation, no family history of Parkinson's disease, he denies gait abnormality.   MRI of the brain November 2017 at the [facility] showed atrophy and small vessel disease no acute abnormality.   Laboratory evaluation in August 2018, normal magnesium, CBC, lipid profile, A1c was 6.1, normal TSH,   Update January 15, 2018. 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  He started  to take Azilect  1 mg daily since October, getting prescription from [place], he denies significant side effect, no significant improvement   UPDATE July 15 2018:YY He is accompanied by his wife at today's clinical visit, he is taking Sinemet  25/100 mg 3 times a day at 9, 2, 8 PM, no significant side effect noticed, there was no wearing off noticed, he has mild limitation mainly due to pain in his shoulders, no significant gait abnormality,  He has a lot of stress due to recent move, has been moody   UPDATE July 22, 2019, He is with his wife, his Parkinson's disease is overall under good control, getting Azilect  1 mg daily  from taking Sinemet  25/100 mg 3 times a day he ambulated without difficulty   UPDATE Apr 27 2021: He is accompanied by his wife at today's clinical visit, he functions very well, denies difficulty walking, only intermittent hand tremor, he is still getting Azilect  1 mg daily, Sinemet  25/100 mg 1 in the morning, 1 and half at noon, 1 tablet at nighttime, he denies significant side effect, no wearing off,  UPDATE  April 05 2023: He has been doing very well, tolerating Azilect  1 mg daily, Sinemet  25/100 mg 4 tablets a day, was not sure it has helped any of his symptoms, only intermittent right hand tremor, denies significant gait abnormality, play his golf regularly  Was treated at [facility] emergency room on April 4, On April 3rd 2024, he had sudden onset right arm leg numness, denies weakness, but has mild slurred speech,next day mildly symptomatic, wife urged him to the emergency room on April 4,  MRI of brain on April 4th 2024, [facility] 1. No acute intracranial abnormality.  2. 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 Frann, now on higher dose of lipitor 80mg  daily,  was put on ASA 81mg  daily,   Update August 22, 2023 SS: In April 2024 Dr. Onita reduced Sinemet  from 4 times daily to 3 times daily, on Azilect  1 mg daily. He has noted more tremor in the right hand, feeling more unsteady. Still playing golf, yesterday, he felt weak, unsteady, he had to stop playing golf. He still feels this way, 1 week ago left leg started feeling weak, new to him, still feels weak. Feels off balance, feels left leg drags Taking aspirin 81 every other day. On Lipitor. No headaches or vision change.  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  Recommended 3 weeks DAPT aspirin 81 and Plavix  75, then Plavix  75 daily alone.  He refused physical  therapy. Taking Plavix  75 mg daily and aspirin. playing golf twice a week. No longer feels weakness to left leg. Tremor to right hand, little worse. Remains on Sinemet  4 times daily, Azilect  once daily. Has no complaints. He drove here today. He has lost 10 lbs on metformin .   Update 10/21/24 SS: His wife passed in August. Has lost 12 lbs since last seen, under a lot of stress. Tremors are some worse. Remains on Plavix  75 mg daily. Sinemet  25/100 mg 4 times daily, Azilect  1 mg. No falls, or freezing. Reports tingling to left side of face, like crawling, down to shoulder for last month is intermittent, not present at night. PCP checked B12 272, started OTC supplement.   PHYSICAL EXAM:   Vitals:   10/21/24 1504 10/21/24 1509  BP: (!) 144/90 (!) 150/90  Pulse: 75   SpO2: 97%   Weight: 202 lb 9.6 oz (91.9 kg)   Height: 6' 1 (1.854 m)    Not recorded    Body mass index is 26.73 kg/m.  Physical Exam  General: The patient is alert and cooperative at the time of  the examination.  Skin: No significant peripheral edema is noted.  Neurologic Exam  Mental status: The patient is alert and oriented x 3 at the time of the examination. The patient has apparent normal recent and remote memory, with an apparently normal attention span and concentration ability.  Cranial nerves: Facial symmetry is present. Speech is normal, no aphasia or dysarthria is noted. Extraocular movements are full. Visual fields are full.  Motor: Resting tremor right hand noted moderately, increased rigidity, bradykinesia to the right side versus left, mostly to the arm.  I did not note any significant weakness to the left side.  Sensory examination: Soft touch sensation is symmetric on the face, arms, and legs.  Coordination: The patient has good finger-nose-finger and heel-to-shin bilaterally.  Gait and station: Can stand from seated position without pushoff, gait is steady, decreased arm swing on the right with tremor  to the right hand  Reflexes: Deep tendon reflexes are symmetric.  REVIEW OF SYSTEMS:  Full 14 system review of systems performed and notable only for as above All other review of systems were negative.   ALLERGIES: Allergies  Allergen Reactions   Penicillins     REACTION: rash and itching    HOME MEDICATIONS: Current Outpatient Medications  Medication Sig Dispense Refill   carbidopa -levodopa  (SINEMET  IR) 25-100 MG tablet Take 1 tablet by mouth 4 (four) times daily. 360 tablet 4   Cholecalciferol (D3 PO) Take 1 capsule by mouth daily.     clopidogrel  (PLAVIX ) 75 MG tablet Take 1 tablet (75 mg total) by mouth daily. 90 tablet 3   fenofibrate  (TRICOR ) 145 MG tablet Take 1 tablet (145 mg total) by mouth daily. 90 tablet 3   fluticasone  (FLONASE ) 50 MCG/ACT nasal spray Place 2 sprays into both nostrils daily. 16 g 6   lisinopril -hydrochlorothiazide  (ZESTORETIC ) 20-12.5 MG tablet TAKE 1 TABLET DAILY 90 tablet 3   metFORMIN  (GLUCOPHAGE -XR) 500 MG 24 hr tablet Take 2 tablets (1,000 mg total) by mouth daily with breakfast. 180 tablet 3   omeprazole  (PRILOSEC) 40 MG capsule Take 1 capsule (40 mg total) by mouth daily. 90 capsule 3   rasagiline  (AZILECT ) 1 MG TABS tablet Take 1 tablet (1 mg total) by mouth daily. 90 tablet 3   simvastatin  (ZOCOR ) 40 MG tablet Take 1 tablet (40 mg total) by mouth at bedtime. 90 tablet 3   TURMERIC CURCUMIN PO Take 1 tablet by mouth daily.     No current facility-administered medications for this visit.    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 Carrie   CHOLECYSTECTOMY  02/1993   Dr. Eletha   right hernia repair  2002   Dr. Effie   skin cancer removed from right shoulder  11/2013   melanoma    FAMILY HISTORY: Family History  Problem Relation Age of Onset   Diabetes Mother  Dementia Mother    Dementia Father    Diabetes Sister    Healthy Son        x2   Healthy Daughter        x1   Colon cancer Neg Hx     SOCIAL HISTORY: Social History   Socioeconomic History   Marital status: Married    Spouse name: Idell   Number of children: 3   Years of education: One year college   Highest education level: Not on file  Occupational History   Occupation: retired naval architect  Tobacco Use   Smoking status: Never   Smokeless tobacco: Never  Vaping Use   Vaping status: Never Used  Substance and Sexual Activity   Alcohol use: Yes    Alcohol/week: 1.0 standard drink of alcohol    Types: 1 Glasses of wine per week    Comment: 3 drinks per week   Drug use: No   Sexual activity: Yes  Other Topics Concern   Not on file  Social History Narrative   Lives at home with his wife.   Right-handed.   2 cups coffee in the morning.   Social Drivers of Corporate Investment Banker Strain: Low Risk  (12/31/2023)   Overall Financial Resource Strain (CARDIA)    Difficulty of Paying Living Expenses: Not very hard  Food Insecurity: No Food Insecurity (12/31/2023)   Hunger Vital Sign    Worried About Running Out of Food in the Last Year: Never true    Ran Out of Food in the Last Year: Never true  Transportation Needs: No Transportation Needs (12/31/2023)   PRAPARE - Administrator, Civil Service (Medical): No    Lack of Transportation (Non-Medical): No  Physical Activity: Insufficiently Active (12/31/2023)   Exercise Vital Sign    Days of Exercise per Week: 3 days    Minutes of Exercise per Session: 30 min   Stress: No Stress Concern Present (12/31/2023)   [employer] of Occupational Health - Occupational Stress Questionnaire    Feeling of Stress : Only a little  Social Connections: Moderately Integrated (12/31/2023)   Social Connection and Isolation Panel    Frequency of Communication with Friends and Family: More than three times a week    Frequency of Social Gatherings with Friends and Family: Three times a week    Attends Religious Services: Never    Active Member of Clubs or Organizations: Yes    Attends Banker Meetings: More than 4 times per year    Marital Status: Married  Catering Manager Violence: Not At Risk (12/31/2023)   Humiliation, Afraid, Rape, and Kick questionnaire    Fear of Current or Ex-Partner: No    Emotionally Abused: No    Physically Abused: No    Sexually Abused: No   Lauraine Gayland MANDES, DNP  [facility] [address] [place], [place] [address] [phone]

## 2024-10-25 LAB — INTRINSIC FACTOR BLOCKING ANTIBODY: Intrinsic Factor: NEGATIVE

## 2024-10-29 ENCOUNTER — Telehealth: Payer: Self-pay

## 2024-10-29 ENCOUNTER — Other Ambulatory Visit: Payer: Self-pay

## 2024-10-29 MED ORDER — OMEPRAZOLE 40 MG PO CPDR: 40.0000 mg | DELAYED_RELEASE_CAPSULE | Freq: Every day | ORAL | 3 refills | Status: AC

## 2024-10-29 NOTE — Telephone Encounter (Signed)
 Copied from CRM [id number]. Topic: General - Other >> Oct 29, 2024 12:19 PM Rosina BIRCH wrote: Reason for CRM: patient called stating he got a letter in the mail stating his medication was denied and he need a refill-omeprazole . I did let the patient know that he has refills at the pharmacy but the patient stated it is suppose to be [facility] mail [facility]  [phone]  Refill sent to mail service and pt was advised.

## 2024-11-19 ENCOUNTER — Ambulatory Visit: Payer: Self-pay | Admitting: Family Medicine

## 2024-11-19 ENCOUNTER — Other Ambulatory Visit

## 2024-11-19 DIAGNOSIS — H43813 Vitreous degeneration, bilateral: Secondary | ICD-10-CM | POA: Diagnosis not present

## 2024-11-19 DIAGNOSIS — C44629 Squamous cell carcinoma of skin of left upper limb, including shoulder: Secondary | ICD-10-CM | POA: Diagnosis not present

## 2024-11-19 DIAGNOSIS — Z129 Encounter for screening for malignant neoplasm, site unspecified: Secondary | ICD-10-CM | POA: Diagnosis not present

## 2024-11-19 DIAGNOSIS — Z7984 Long term (current) use of oral hypoglycemic drugs: Secondary | ICD-10-CM | POA: Diagnosis not present

## 2024-11-19 DIAGNOSIS — H524 Presbyopia: Secondary | ICD-10-CM | POA: Diagnosis not present

## 2024-11-19 DIAGNOSIS — E538 Deficiency of other specified B group vitamins: Secondary | ICD-10-CM | POA: Diagnosis not present

## 2024-11-19 DIAGNOSIS — L821 Other seborrheic keratosis: Secondary | ICD-10-CM | POA: Diagnosis not present

## 2024-11-19 DIAGNOSIS — D3132 Benign neoplasm of left choroid: Secondary | ICD-10-CM | POA: Diagnosis not present

## 2024-11-19 DIAGNOSIS — H04123 Dry eye syndrome of bilateral lacrimal glands: Secondary | ICD-10-CM | POA: Diagnosis not present

## 2024-11-19 DIAGNOSIS — C44729 Squamous cell carcinoma of skin of left lower limb, including hip: Secondary | ICD-10-CM | POA: Diagnosis not present

## 2024-11-19 DIAGNOSIS — H02135 Senile ectropion of left lower eyelid: Secondary | ICD-10-CM | POA: Diagnosis not present

## 2024-11-19 DIAGNOSIS — L570 Actinic keratosis: Secondary | ICD-10-CM | POA: Diagnosis not present

## 2024-11-19 DIAGNOSIS — H5201 Hypermetropia, right eye: Secondary | ICD-10-CM | POA: Diagnosis not present

## 2024-11-19 DIAGNOSIS — Z961 Presence of intraocular lens: Secondary | ICD-10-CM | POA: Diagnosis not present

## 2024-11-19 DIAGNOSIS — H02132 Senile ectropion of right lower eyelid: Secondary | ICD-10-CM | POA: Diagnosis not present

## 2024-11-19 DIAGNOSIS — H52223 Regular astigmatism, bilateral: Secondary | ICD-10-CM | POA: Diagnosis not present

## 2024-11-19 DIAGNOSIS — Z85820 Personal history of malignant melanoma of skin: Secondary | ICD-10-CM | POA: Diagnosis not present

## 2024-11-19 DIAGNOSIS — E119 Type 2 diabetes mellitus without complications: Secondary | ICD-10-CM | POA: Diagnosis not present

## 2024-11-19 LAB — CBC WITH DIFFERENTIAL/PLATELET
Basophils Absolute: 0 K/uL (ref 0.0–0.1)
Basophils Relative: 0.3 % (ref 0.0–3.0)
Eosinophils Absolute: 0.2 K/uL (ref 0.0–0.7)
Eosinophils Relative: 2 % (ref 0.0–5.0)
HCT: 47 % (ref 39.0–52.0)
Hemoglobin: 15.7 g/dL (ref 13.0–17.0)
Lymphocytes Relative: 44.7 % (ref 12.0–46.0)
Lymphs Abs: 5.6 K/uL — ABNORMAL HIGH (ref 0.7–4.0)
MCHC: 33.4 g/dL (ref 30.0–36.0)
MCV: 94.7 fl (ref 78.0–100.0)
Monocytes Absolute: 1 K/uL (ref 0.1–1.0)
Monocytes Relative: 8.1 % (ref 3.0–12.0)
Neutro Abs: 5.6 K/uL (ref 1.4–7.7)
Neutrophils Relative %: 44.9 % (ref 43.0–77.0)
Platelets: 180 K/uL (ref 150.0–400.0)
RBC: 4.96 Mil/uL (ref 4.22–5.81)
RDW: 13.7 % (ref 11.5–15.5)
WBC: 12.5 K/uL — ABNORMAL HIGH (ref 4.0–10.5)

## 2024-11-24 NOTE — Telephone Encounter (Signed)
 Copied from CRM #[id number]. Topic: General - Other >> Nov 24, 2024 10:14 AM Zebedee SAUNDERS wrote: Reason for CRM: Pt returning Cousar, Amy HERO, CMA call provided message.  Pt stated he has not smoked and would like a call back [phone] from clinic.

## 2024-12-29 ENCOUNTER — Ambulatory Visit (INDEPENDENT_AMBULATORY_CARE_PROVIDER_SITE_OTHER): Admitting: *Deleted

## 2024-12-29 VITALS — BP 115/68 | HR 78 | Temp 98.9°F | Resp 18 | Ht 73.0 in | Wt 206.6 lb

## 2024-12-29 DIAGNOSIS — Z0000 Encounter for general adult medical examination without abnormal findings: Secondary | ICD-10-CM | POA: Diagnosis not present

## 2024-12-29 NOTE — Progress Notes (Signed)
 "  Chief Complaint  Patient presents with   Medicare Wellness     Subjective:   Howard Fisher is a 83 y.o. male who presents for a Medicare Annual Wellness Visit.  Visit info / Clinical Intake: Medicare Wellness Visit Type:: Subsequent Annual Wellness Visit Persons participating in visit and providing information:: patient Medicare Wellness Visit Mode:: In-person (required for WTM) Interpreter Needed?: No Pre-visit prep was completed: no AWV questionnaire completed by patient prior to visit?: no Living arrangements:: (!) lives alone Patient's Overall Health Status Rating: good Typical amount of pain: none Does pain affect daily life?: no Are you currently prescribed opioids?: no  Dietary Habits and Nutritional Risks How many meals a day?: 3 Eats fruit and vegetables daily?: yes Most meals are obtained by: preparing own meals In the last 2 weeks, have you had any of the following?: none Diabetic:: (!) yes Any non-healing wounds?: no How often do you check your BS?: as needed Would you like to be referred to a Nutritionist or for Diabetic Management? : no  Functional Status Activities of Daily Living (to include ambulation/medication): Independent Ambulation: Independent Medication Administration: Independent Home Management (perform basic housework or laundry): Independent Manage your own finances?: yes Primary transportation is: driving Concerns about vision?: no *vision screening is required for WTM* (up to date with Dr Renate) Concerns about hearing?: no  Fall Screening Falls in the past year?: 0 Number of falls in past year: 0 Was there an injury with Fall?: 0 Fall Risk Category Calculator: 0 Patient Fall Risk Level: Low Fall Risk  Fall Risk Patient at Risk for Falls Due to: No Fall Risks Fall risk Follow up: Falls evaluation completed  Home and Transportation Safety: All rugs have non-skid backing?: (!) no All stairs or steps have railings?: N/A, no  stairs Grab bars in the bathtub or shower?: yes Have non-skid surface in bathtub or shower?: yes Good home lighting?: yes Regular seat belt use?: yes Hospital stays in the last year:: no  Cognitive Assessment Difficulty concentrating, remembering, or making decisions? : yes Will 6CIT or Mini Cog be Completed: yes What year is it?: 0 points What month is it?: 0 points Give patient an address phrase to remember (5 components): [address], [place] [employer] About what time is it?: 0 points Count backwards from 20 to 1: 0 points Say the months of the year in reverse: 0 points Repeat the address phrase from earlier: 4 points 6 CIT Score: 4 points  Advance Directives (For Healthcare) Does Patient Have a Medical Advance Directive?: Yes Does patient want to make changes to medical advance directive?: No - Patient declined Type of Advance Directive: Healthcare Power of [place]; Living will Copy of Healthcare Power of Attorney in Chart?: No - copy requested Copy of Living Will in Chart?: No - copy requested Would patient like information on creating a medical advance directive?: No - Patient declined  Reviewed/Updated  Reviewed/Updated: Reviewed All (Medical, Surgical, Family, Medications, Allergies, Care Teams, Patient Goals)    Allergies (verified) Penicillins   Current Medications (verified) Outpatient Encounter Medications as of 12/29/2024  Medication Sig   carbidopa -levodopa  (SINEMET  IR) 25-100 MG tablet Take 1 tablet by mouth 4 (four) times daily.   Cholecalciferol (D3 PO) Take 1 capsule by mouth daily.   clopidogrel  (PLAVIX ) 75 MG tablet Take 1 tablet (75 mg total) by mouth daily.   fenofibrate  (TRICOR ) 145 MG tablet Take 1 tablet (145 mg total) by mouth daily.   fluticasone  (FLONASE ) 50 MCG/ACT nasal  spray Place 2 sprays into both nostrils daily.   lisinopril -hydrochlorothiazide  (ZESTORETIC ) 20-12.5 MG tablet TAKE 1 TABLET DAILY   metFORMIN  (GLUCOPHAGE -XR) 500 MG  24 hr tablet Take 2 tablets (1,000 mg total) by mouth daily with breakfast.   omeprazole  (PRILOSEC) 40 MG capsule Take 1 capsule (40 mg total) by mouth daily.   rasagiline  (AZILECT ) 1 MG TABS tablet Take 1 tablet (1 mg total) by mouth daily.   simvastatin  (ZOCOR ) 40 MG tablet Take 1 tablet (40 mg total) by mouth at bedtime.   TURMERIC CURCUMIN PO Take 1 tablet by mouth daily.   No facility-administered encounter medications on file as of 12/29/2024.    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 Carrie   CHOLECYSTECTOMY  02/1993   Dr. Eletha   right hernia repair  2002   Dr. Effie   skin cancer removed from right shoulder  11/2013   melanoma   Family History  Problem Relation Age of Onset   Diabetes Mother    Dementia Mother    Dementia Father    Diabetes Sister    Healthy Son        x2   Healthy Daughter        x1   Colon cancer Neg Hx    Social History   Occupational History   Occupation: retired naval architect  Tobacco Use   Smoking status: Never   Smokeless tobacco: Never  Vaping Use   Vaping status: Never Used  Substance and Sexual Activity   Alcohol use: Yes    Alcohol/week: 1.0 standard drink of alcohol    Types: 1 Glasses of wine per week    Comment: 1 drink per week   Drug use: No   Sexual activity: Yes   Tobacco Counseling Counseling given: Not Answered  SDOH Screenings   Food Insecurity: No Food Insecurity (12/29/2024)  Housing: Low Risk (12/29/2024)  Transportation  Needs: No Transportation Needs (12/29/2024)  Utilities: Not At Risk (12/29/2024)  Alcohol Screen: Low Risk (12/12/2022)  Depression (PHQ2-9): Low Risk (12/29/2024)  Financial Resource Strain: Low Risk (12/31/2023)  Physical Activity: Insufficiently Active (12/29/2024)  Social Connections: Moderately Integrated (12/29/2024)  Stress: No Stress Concern Present (12/29/2024)  Tobacco Use: Low Risk (12/29/2024)  Health Literacy: Adequate Health Literacy (12/31/2023)   See flowsheets for full screening details  Depression Screen PHQ 2 & 9 Depression Scale- Over the past 2 weeks, how often have you been bothered by any of the following problems? Little interest or pleasure in doing things: 0 Feeling down, depressed, or hopeless (PHQ Adolescent also includes...irritable): 0 PHQ-2 Total Score: 0 Trouble falling or staying asleep, or sleeping too much: 0 Feeling tired or having little energy: 1 (just has to prioritize when I have so many things to do) Poor appetite or overeating (PHQ Adolescent also includes...weight loss): 0 Feeling bad about yourself - or that you are a failure or have  let yourself or your family down: 0 Trouble concentrating on things, such as reading the newspaper or watching television (PHQ Adolescent also includes...like school work): 0 Moving or speaking so slowly that other people could have noticed. Or the opposite - being so fidgety or restless that you have been moving around a lot more than usual: 0 Thoughts that you would be better off dead, or of hurting yourself in some way: 0 PHQ-9 Total Score: 1 If you checked off any problems, how difficult have these problems made it for you to do your work, take care of things at home, or get along with other people?: Not difficult at all  Depression Treatment Depression Interventions/Treatment : EYV7-0 Score <4 Follow-up Not Indicated     Goals Addressed   None          Objective:    Today's Vitals   12/29/24 1335  BP: 115/68   Pulse: 78  Resp: 18  Temp: 98.9 F (37.2 C)  TempSrc: Oral  SpO2: 94%  Weight: 206 lb 9.6 oz (93.7 kg)  Height: 6' 1 (1.854 m)   Body mass index is 27.26 kg/m.  Hearing/Vision screen No results found. Immunizations and Health Maintenance Health Maintenance  Topic Date Due   HEMOGLOBIN A1C  01/07/2025   Diabetic kidney evaluation - Urine ACR  07/07/2025   FOOT EXAM  07/07/2025   Diabetic kidney evaluation - eGFR measurement  10/14/2025   OPHTHALMOLOGY EXAM  11/19/2025   Medicare Annual Wellness (AWV)  12/29/2025   DTaP/Tdap/Td (3 - Td or Tdap) 02/03/2034   Pneumococcal Vaccine: 50+ Years  Completed   Meningococcal B Vaccine  Aged Out   Influenza Vaccine  Discontinued   Colonoscopy  Discontinued   COVID-19 Vaccine  Discontinued   Zoster Vaccines- Shingrix  Discontinued        Assessment/Plan:  This is a routine wellness examination for [place].  Patient Care Team: Frann Mabel Mt, DO as PCP - General (Family Medicine) Pyrtle, Gordy HERO, MD as Consulting Physician (Gastroenterology) Renate Lynwood Hussar, MD as Referring Physician  I have personally reviewed and noted the following in the patients chart:   Medical and social history Use of alcohol, tobacco or illicit drugs  Current medications and supplements including opioid prescriptions. Functional ability and status Nutritional status Physical activity Advanced directives List of other physicians Hospitalizations, surgeries, and ER visits in previous 12 months Vitals Screenings to include cognitive, depression, and falls Referrals and appointments  No orders of the defined types were placed in this encounter.  In addition, I have reviewed and discussed with patient certain preventive protocols, quality metrics, and best practice recommendations. A written personalized care plan for preventive services as well as general preventive health recommendations were provided to patient.   Lolita Libra,  CMA   12/29/2024   Return in 1 year (on 12/29/2025).  After Visit Summary: (In Person-Printed) AVS printed and given to the patient  Nurse Notes: HM Addressed: Pt declines shingles vaccine.  "

## 2024-12-29 NOTE — Patient Instructions (Addendum)
 Mr. Allegretto,  Thank you for taking the time for your Medicare Wellness Visit. I appreciate your continued commitment to your health goals. Please review the care plan we discussed, and feel free to reach out if I can assist you further.  Please note that Annual Wellness Visits do not include a physical exam. Some assessments may be limited, especially if the visit was conducted virtually. If needed, we may recommend an in-person follow-up with your provider.  Ongoing Care Seeing your primary care provider every 3 to 6 months helps us  monitor your health and provide consistent, personalized care.   Dr Frann: 01/07/25 1:30pm Medicare AWV:  12/30/25 1:40pm, in person  Referrals If a referral was made during today's visit and you haven't received any updates within two weeks, please contact the referred provider directly to check on the status.  Recommended Screenings:  Health Maintenance  Topic Date Due   Medicare Annual Wellness Visit  12/30/2024   Hemoglobin A1C  01/07/2025   Yearly kidney health urinalysis for diabetes  07/07/2025   Complete foot exam   07/07/2025   Yearly kidney function blood test for diabetes  10/14/2025   Eye exam for diabetics  11/19/2025   DTaP/Tdap/Td vaccine (3 - Td or Tdap) 02/03/2034   Pneumococcal Vaccine for age over 54  Completed   Meningitis B Vaccine  Aged Out   Flu Shot  Discontinued   Colon Cancer Screening  Discontinued   COVID-19 Vaccine  Discontinued   Zoster (Shingles) Vaccine  Discontinued       12/29/2024    1:43 PM  Advanced Directives  Does Patient Have a Medical Advance Directive? Yes  Type of Estate Agent of [place];Living will  Does patient want to make changes to medical advance directive? No - Patient declined  Copy of Healthcare Power of Attorney in Chart? No - copy requested  Would patient like information on creating a medical advance directive? No - Patient declined  Please bring a copy of your health  care power of attorney and living will to the office to be added to your chart at your convenience. You can mail a copy to [facility] [address] [place], [place] [address] or email to [email or web address]   Vision: Annual vision screenings are recommended for early detection of glaucoma, cataracts, and diabetic retinopathy. These exams can also reveal signs of chronic conditions such as diabetes and high blood pressure.  Dental: Annual dental screenings help detect early signs of oral cancer, gum disease, and other conditions linked to overall health, including heart disease and diabetes.  Please see the attached documents for additional preventive care recommendations.

## 2025-01-07 ENCOUNTER — Ambulatory Visit (INDEPENDENT_AMBULATORY_CARE_PROVIDER_SITE_OTHER): Admitting: Family Medicine

## 2025-01-07 ENCOUNTER — Encounter: Payer: Self-pay | Admitting: Family Medicine

## 2025-01-07 VITALS — BP 130/78 | HR 90 | Temp 98.0°F | Resp 16 | Ht 73.0 in | Wt 207.8 lb

## 2025-01-07 DIAGNOSIS — M7502 Adhesive capsulitis of left shoulder: Secondary | ICD-10-CM

## 2025-01-07 DIAGNOSIS — I10 Essential (primary) hypertension: Secondary | ICD-10-CM | POA: Diagnosis not present

## 2025-01-07 DIAGNOSIS — E7800 Pure hypercholesterolemia, unspecified: Secondary | ICD-10-CM

## 2025-01-07 DIAGNOSIS — Z7984 Long term (current) use of oral hypoglycemic drugs: Secondary | ICD-10-CM

## 2025-01-07 DIAGNOSIS — M7501 Adhesive capsulitis of right shoulder: Secondary | ICD-10-CM

## 2025-01-07 DIAGNOSIS — E1165 Type 2 diabetes mellitus with hyperglycemia: Secondary | ICD-10-CM | POA: Diagnosis not present

## 2025-01-07 MED ORDER — METFORMIN HCL ER 500 MG PO TB24: 1000.0000 mg | ORAL_TABLET | Freq: Every day | ORAL | 3 refills | Status: DC

## 2025-01-07 MED ORDER — SIMVASTATIN 40 MG PO TABS: 40.0000 mg | ORAL_TABLET | Freq: Every day | ORAL | 3 refills | Status: AC

## 2025-01-07 MED ORDER — FENOFIBRATE 145 MG PO TABS: 145.0000 mg | ORAL_TABLET | Freq: Every day | ORAL | 3 refills | Status: AC

## 2025-01-07 MED ORDER — LISINOPRIL-HYDROCHLOROTHIAZIDE 20-12.5 MG PO TABS: 1.0000 | ORAL_TABLET | Freq: Every day | ORAL | 3 refills | Status: AC

## 2025-01-07 NOTE — Patient Instructions (Signed)
Give [place] 2-3 business days to get the results of your labs back.   Keep the diet clean and stay active.  Heat (pad or rice pillow in microwave) over affected area, 10-15 minutes twice daily.   Ice/cold pack over area for 10-15 min twice daily.  OK to take Tylenol 1000 mg (2 extra strength tabs) or 975 mg (3 regular strength tabs) every 6 hours as needed.  Let us know if you need anything.  EXERCISES  RANGE OF MOTION (ROM) AND STRETCHING EXERCISES These exercises may help you when beginning to rehabilitate your injury. While completing these exercises, remember:  Restoring tissue flexibility helps normal motion to return to the joints. This allows healthier, less painful movement and activity. An effective stretch should be held for at least 30 seconds. A stretch should never be painful. You should only feel a gentle lengthening or release in the stretched tissue.  ROM - Pendulum Bend at the waist so that your right / left arm falls away from your body. Support yourself with your opposite hand on a solid surface, such as a table or a countertop. Your right / left arm should be perpendicular to the ground. If it is not perpendicular, you need to lean over farther. Relax the muscles in your right / left arm and shoulder as much as possible. Gently sway your hips and trunk so they move your right / left arm without any use of your right / left shoulder muscles. Progress your movements so that your right / left arm moves side to side, then forward and backward, and finally, both clockwise and counterclockwise. Complete 10-15 repetitions in each direction. Many people use this exercise to relieve discomfort in their shoulder as well as to gain range of motion. Repeat 2 times. Complete this exercise 3 times per week.  STRETCH - Flexion, Standing Stand with good posture. With an underhand grip on your right / left hand and an overhand grip on the opposite hand, grasp a broomstick or cane so that  your hands are a little more than shoulder-width apart. Keeping your right / left elbow straight and shoulder muscles relaxed, push the stick with your opposite hand to raise your right / left arm in front of your body and then overhead. Raise your arm until you feel a stretch in your right / left shoulder, but before you have increased shoulder pain. Try to avoid shrugging your right / left shoulder as your arm rises by keeping your shoulder blade tucked down and toward your mid-back spine. Hold 30 seconds. Slowly return to the starting position. Repeat 2 times. Complete this exercise 3 times per week.  STRETCH - Internal Rotation Place your right / left hand behind your back, palm-up. Throw a towel or belt over your opposite shoulder. Grasp the towel/belt with your right / left hand. While keeping an upright posture, gently pull up on the towel/belt until you feel a stretch in the front of your right / left shoulder. Avoid shrugging your right / left shoulder as your arm rises by keeping your shoulder blade tucked down and toward your mid-back spine. Hold 30. Release the stretch by lowering your opposite hand. Repeat 2 times. Complete this exercise 3 times per week.  STRETCH - External Rotation and Abduction Stagger your stance through a doorframe. It does not matter which foot is forward. As instructed by your physician, physical therapist or athletic trainer, place your hands: And forearms above your head and on the door frame. And  forearms at head-height and on the door frame. At elbow-height and on the door frame. Keeping your head and chest upright and your stomach muscles tight to prevent over-extending your low-back, slowly shift your weight onto your front foot until you feel a stretch across your chest and/or in the front of your shoulders. Hold 30 seconds. Shift your weight to your back foot to release the stretch. Repeat 2 times. Complete this stretch 3 times per week.    STRENGTHENING EXERCISES  These exercises may help you when beginning to rehabilitate your injury. They may resolve your symptoms with or without further involvement from your physician, physical therapist or athletic trainer. While completing these exercises, remember:  Muscles can gain both the endurance and the strength needed for everyday activities through controlled exercises. Complete these exercises as instructed by your physician, physical therapist or athletic trainer. Progress the resistance and repetitions only as guided. You may experience muscle soreness or fatigue, but the pain or discomfort you are trying to eliminate should never worsen during these exercises. If this pain does worsen, stop and make certain you are following the directions exactly. If the pain is still present after adjustments, discontinue the exercise until you can discuss the trouble with your clinician. If advised by your physician, during your recovery, avoid activity or exercises which involve actions that place your right / left hand or elbow above your head or behind your back or head. These positions stress the tissues which are trying to heal.  STRENGTH - Scapular Depression and Adduction With good posture, sit on a firm chair. Supported your arms in front of you with pillows, arm rests or a table top. Have your elbows in line with the sides of your body. Gently draw your shoulder blades down and toward your mid-back spine. Gradually increase the tension without tensing the muscles along the top of your shoulders and the back of your neck. Hold for 3 seconds. Slowly release the tension and relax your muscles completely before completing the next repetition. After you have practiced this exercise, remove the arm support and complete it in standing as well as sitting. Repeat 2 times. Complete this exercise 3 times per week.   STRENGTH - External Rotators Secure a rubber exercise band/tubing to a fixed  object so that it is at the same height as your right / left elbow when you are standing or sitting on a firm surface. Stand or sit so that the secured exercise band/tubing is at your side that is not injured. Bend your elbow 90 degrees. Place a folded towel or small pillow under your right / left arm so that your elbow is a few inches away from your side. Keeping the tension on the exercise band/tubing, pull it away from your body, as if pivoting on your elbow. Be sure to keep your body steady so that the movement is only coming from your shoulder rotating. Hold 3 seconds. Release the tension in a controlled manner as you return to the starting position. Repeat 2 times. Complete this exercise 3 times per week.   STRENGTH - Supraspinatus Stand or sit with good posture. Grasp a 2-3 lb weight or an exercise band/tubing so that your hand is "thumbs-up," like when you shake hands. Slowly lift your right / left hand from your thigh into the air, traveling about 30 degrees from straight out at your side. Lift your hand to shoulder height or as far as you can without increasing any shoulder pain. Initially, many  people do not lift their hands above shoulder height. Avoid shrugging your right / left shoulder as your arm rises by keeping your shoulder blade tucked down and toward your mid-back spine. Hold for 3 seconds. Control the descent of your hand as you slowly return to your starting position. Repeat 2 times. Complete this exercise 3 times per week.   STRENGTH - Shoulder Extensors Secure a rubber exercise band/tubing so that it is at the height of your shoulders when you are either standing or sitting on a firm arm-less chair. With a thumbs-up grip, grasp an end of the band/tubing in each hand. Straighten your elbows and lift your hands straight in front of you at shoulder height. Step back away from the secured end of band/tubing until it becomes tense. Squeezing your shoulder blades together, pull  your hands down to the sides of your thighs. Do not allow your hands to go behind you. Hold for 3 seconds. Slowly ease the tension on the band/tubing as you reverse the directions and return to the starting position. Repeat 2 times. Complete this exercise 3 times per week.   STRENGTH - Scapular Retractors Secure a rubber exercise band/tubing so that it is at the height of your shoulders when you are either standing or sitting on a firm arm-less chair. With a palm-down grip, grasp an end of the band/tubing in each hand. Straighten your elbows and lift your hands straight in front of you at shoulder height. Step back away from the secured end of band/tubing until it becomes tense. Squeezing your shoulder blades together, draw your elbows back as you bend them. Keep your upper arm lifted away from your body throughout the exercise. Hold 3 seconds. Slowly ease the tension on the band/tubing as you reverse the directions and return to the starting position. Repeat 2 times. Complete this exercise 3 times per week.  STRENGTH - Scapular Depressors Find a sturdy chair without wheels, such as a from a dining room table. Keeping your feet on the floor, lift your bottom from the seat and lock your elbows. Keeping your elbows straight, allow gravity to pull your body weight down. Your shoulders will rise toward your ears. Raise your body against gravity by drawing your shoulder blades down your back, shortening the distance between your shoulders and ears. Although your feet should always maintain contact with the floor, your feet should progressively support less body weight as you get stronger. Hold 3 seconds. In a controlled and slow manner, lower your body weight to begin the next repetition. Repeat 2 times. Complete this exercise 3 times per week.    This information is not intended to replace advice given to you by your health care provider. Make sure you discuss any questions you have with your health  care provider.   Document Released: 10/25/2005 Document Revised: 01/01/2015 Document Reviewed: 03/25/2009 Elsevier Interactive Patient Education [employer].

## 2025-01-07 NOTE — Progress Notes (Signed)
 Subjective:   Chief Complaint  Patient presents with   Follow-up    Follow Up    Howard Fisher is a 84 y.o. male here for follow-up of diabetes.   Kace does not routinely ck his sugars . Patient does not require insulin.   Medications include: Metformin  XR 1000 mg/d Diet is healthy.  Exercise: walking, some golf  Hypertension Patient presents for hypertension follow up. He does does not routinely monitor home blood pressures. He is compliant with medications- Prinzide  20-12.5 mg/d. Patient has these side effects of medication: none Diet/exercise as above.  No CP or SOB.   Hyperlipidemia Patient presents for mixed hyperlipidemia follow up. Currently being treated with Tricor  145 mg daily, Zocor  40 mg daily and compliance with treatment thus far has been good. He denies myalgias. Diet/exercise as above. The patient is not known to have coexisting coronary artery disease.  Over the past several months, he has had pain in both of his shoulders.  Decreased range of motion associated with it.  No injury or change in activity.  Affects his golf game to some extent.  No bruising, redness, swelling, or neurologic signs or symptoms.  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   Diabetes mellitus without complication (HCC)    GERD (gastroesophageal reflux disease)    Hiatal hernia    History of stomach ulcers    1985   Hypercholesteremia    Hypertension    Melanoma (HCC) 2014   shoulder   Parkinson disease (HCC)    Peyronie's disease    Stroke (HCC)      Related testing: Retinal exam: Done Pneumovax: done  Objective:  BP 130/78 (BP Location: Left Arm, Patient Position: Sitting)   Pulse 90   Temp 98 F (36.7 C) (Oral)   Resp 16   Ht 6' 1 (1.854 m)   Wt 207 lb 12.8 oz (94.3 kg)   SpO2 95%   BMI 27.42 kg/m  General:  Well developed, well nourished, in no apparent distress Lungs:  CTAB, no access msc  use Cardio:  RRR, no bruits, no LE edema MSK: No muscular or bony TTP.  No deformity, ecchymosis, edema.  Decreased active and passive range of motion with forward flexion.  Positive Neer's bilaterally.  Negative Hawkins, crossover, liftoff, speeds bilaterally.  Empty can difficult to ascertain. Psych: Age appropriate judgment and insight  Assessment:   Type 2 diabetes mellitus with hyperglycemia, without long-term current use of insulin (HCC) - Plan: metFORMIN  (GLUCOPHAGE -XR) 500 MG 24 hr tablet, Comprehensive metabolic panel with GFR, Lipid panel, Hemoglobin A1c  Essential hypertension  HYPERCHOLESTEROLEMIA  Adhesive capsulitis of both shoulders   Plan:   Chronic, stable.  Continue metformin  XR 1000 mg daily.  Counseled on diet and exercise. Chronic, stable.  Continue Zestoretic  20-12.5 mg daily. Chronic, stable.  Continue Zocor  40 mg daily, Tricor  145 mg daily. Stretches and exercises provided.  Could consider injections if no improvement over the next month versus physical therapy. F/u in 6 mo. The patient voiced understanding and agreement to the plan.  Mabel [place], DO 01/07/2025 3:28 PM

## 2025-01-08 ENCOUNTER — Ambulatory Visit: Payer: Self-pay | Admitting: Family Medicine

## 2025-01-08 DIAGNOSIS — E1165 Type 2 diabetes mellitus with hyperglycemia: Secondary | ICD-10-CM

## 2025-01-08 LAB — COMPREHENSIVE METABOLIC PANEL WITH GFR
ALT: 16 U/L (ref 3–53)
AST: 21 U/L (ref 5–37)
Albumin: 4.6 g/dL (ref 3.5–5.2)
Alkaline Phosphatase: 68 U/L (ref 39–117)
BUN: 21 mg/dL (ref 6–23)
CO2: 30 meq/L (ref 19–32)
Calcium: 9.5 mg/dL (ref 8.4–10.5)
Chloride: 101 meq/L (ref 96–112)
Creatinine, Ser: 1.22 mg/dL (ref 0.40–1.50)
GFR: 54.35 mL/min — ABNORMAL LOW
Glucose, Bld: 210 mg/dL — ABNORMAL HIGH (ref 70–99)
Potassium: 3.8 meq/L (ref 3.5–5.1)
Sodium: 139 meq/L (ref 135–145)
Total Bilirubin: 0.7 mg/dL (ref 0.2–1.2)
Total Protein: 7 g/dL (ref 6.0–8.3)

## 2025-01-08 LAB — LIPID PANEL
Cholesterol: 135 mg/dL (ref 28–200)
HDL: 41.7 mg/dL
LDL Cholesterol: 58 mg/dL (ref 10–99)
NonHDL: 93.78
Total CHOL/HDL Ratio: 3
Triglycerides: 180 mg/dL — ABNORMAL HIGH (ref 10.0–149.0)
VLDL: 36 mg/dL (ref 0.0–40.0)

## 2025-01-08 LAB — HEMOGLOBIN A1C: Hgb A1c MFr Bld: 7.7 % — ABNORMAL HIGH (ref 4.6–6.5)

## 2025-01-08 MED ORDER — METFORMIN HCL ER 500 MG PO TB24: 1000.0000 mg | ORAL_TABLET | Freq: Two times a day (BID) | ORAL | 3 refills | Status: AC

## 2025-01-08 NOTE — Telephone Encounter (Signed)
 I want him to take his metformin  1000 mg twice daily instead of daily. Focus on making healthy eating choices and we can see how things look in July. I sent more to the [facility] mail order pharm. Thx.

## 2025-04-27 ENCOUNTER — Ambulatory Visit: Admitting: Neurology

## 2025-06-08 ENCOUNTER — Encounter: Admitting: Family Medicine

## 2025-12-30 ENCOUNTER — Ambulatory Visit
